# Patient Record
Sex: Female | Born: 1995
Health system: Southern US, Community
[De-identification: ages and names within clinical notes are randomized; demographics above are authoritative.]

## PROBLEM LIST (undated history)

## (undated) ENCOUNTER — Inpatient Hospital Stay (HOSPITAL_COMMUNITY): Payer: Self-pay

## (undated) DIAGNOSIS — M419 Scoliosis, unspecified: Secondary | ICD-10-CM

## (undated) DIAGNOSIS — M549 Dorsalgia, unspecified: Secondary | ICD-10-CM

## (undated) DIAGNOSIS — O99891 Other specified diseases and conditions complicating pregnancy: Secondary | ICD-10-CM

## (undated) DIAGNOSIS — R001 Bradycardia, unspecified: Secondary | ICD-10-CM

## (undated) DIAGNOSIS — O468X9 Other antepartum hemorrhage, unspecified trimester: Secondary | ICD-10-CM

## (undated) DIAGNOSIS — O039 Complete or unspecified spontaneous abortion without complication: Secondary | ICD-10-CM

## (undated) DIAGNOSIS — F329 Major depressive disorder, single episode, unspecified: Secondary | ICD-10-CM

## (undated) DIAGNOSIS — F32A Depression, unspecified: Secondary | ICD-10-CM

## (undated) DIAGNOSIS — IMO0001 Reserved for inherently not codable concepts without codable children: Secondary | ICD-10-CM

## (undated) DIAGNOSIS — O418X90 Other specified disorders of amniotic fluid and membranes, unspecified trimester, not applicable or unspecified: Secondary | ICD-10-CM

## (undated) HISTORY — PX: NO PAST SURGERIES: SHX2092

---
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
## 2016-02-24 NOTE — L&D Delivery Note (Signed)
Patient is a 21 y.o. now G1P0 s/p NSVD at 3374w3d, who was admitted for SOL (Active labor).  Delivery Note At 2:32 PM a viable female was delivered via  (Presentation:cephalic ;LOA  ).  APGAR:7 , 9; weight 2971g Placenta status:intact Cord: 3 vessel  with the following complications none    Anesthesia:  epidural Episiotomy:  none Lacerations:  none Suture Repair: none Est. Blood Loss (mL):200    Mom to postpartum.  Baby to Couplet care / Skin to Skin.  Head delivered LOA. No nuchal cord present. Shoulder and body delivered in usual fashion. Infant with spontaneous cry, placed on mother's abdomen, dried and bulb suctioned. Cord clamped x 2  cut by family member. Cord blood drawn. Placenta delivered spontaneously with gentle cord traction. Fundus firm with massage and Pitocin. Perineum inspected and found to have no laceration.   Teodoro KilKerrriann S. Lacey Wallman,  MD Family Medicine Resident PGY-1 12/25/16, 2:42 PM

## 2016-06-18 DIAGNOSIS — Z3401 Encounter for supervision of normal first pregnancy, first trimester: Secondary | ICD-10-CM | POA: Diagnosis not present

## 2016-06-18 LAB — OB RESULTS CONSOLE GC/CHLAMYDIA
CHLAMYDIA, DNA PROBE: NEGATIVE
Gonorrhea: NEGATIVE

## 2016-06-18 LAB — OB RESULTS CONSOLE HEPATITIS B SURFACE ANTIGEN: HEP B S AG: NEGATIVE

## 2016-06-18 LAB — OB RESULTS CONSOLE RPR: RPR: NONREACTIVE

## 2016-06-18 LAB — OB RESULTS CONSOLE ABO/RH: RH TYPE: POSITIVE

## 2016-06-18 LAB — OB RESULTS CONSOLE RUBELLA ANTIBODY, IGM: Rubella: IMMUNE

## 2016-06-18 LAB — OB RESULTS CONSOLE HIV ANTIBODY (ROUTINE TESTING): HIV: NONREACTIVE

## 2016-06-18 LAB — OB RESULTS CONSOLE ANTIBODY SCREEN: Antibody Screen: NEGATIVE

## 2016-07-16 DIAGNOSIS — Z3402 Encounter for supervision of normal first pregnancy, second trimester: Secondary | ICD-10-CM | POA: Diagnosis not present

## 2016-07-16 DIAGNOSIS — N760 Acute vaginitis: Secondary | ICD-10-CM | POA: Diagnosis not present

## 2016-07-16 DIAGNOSIS — Z3A17 17 weeks gestation of pregnancy: Secondary | ICD-10-CM | POA: Diagnosis not present

## 2016-07-16 DIAGNOSIS — O281 Abnormal biochemical finding on antenatal screening of mother: Secondary | ICD-10-CM | POA: Diagnosis not present

## 2016-07-24 ENCOUNTER — Encounter (HOSPITAL_COMMUNITY): Payer: Self-pay | Admitting: *Deleted

## 2016-07-24 ENCOUNTER — Inpatient Hospital Stay (HOSPITAL_COMMUNITY)
Admission: AD | Admit: 2016-07-24 | Discharge: 2016-07-24 | Disposition: A | Discharge status: Home / Self Care | Payer: Medicaid Other | Source: Ambulatory Visit | Attending: Obstetrics and Gynecology | Admitting: Obstetrics and Gynecology

## 2016-07-24 DIAGNOSIS — Z809 Family history of malignant neoplasm, unspecified: Secondary | ICD-10-CM | POA: Diagnosis not present

## 2016-07-24 DIAGNOSIS — Z3A18 18 weeks gestation of pregnancy: Secondary | ICD-10-CM | POA: Insufficient documentation

## 2016-07-24 DIAGNOSIS — N898 Other specified noninflammatory disorders of vagina: Secondary | ICD-10-CM | POA: Insufficient documentation

## 2016-07-24 DIAGNOSIS — R109 Unspecified abdominal pain: Secondary | ICD-10-CM | POA: Insufficient documentation

## 2016-07-24 DIAGNOSIS — O26892 Other specified pregnancy related conditions, second trimester: Secondary | ICD-10-CM | POA: Insufficient documentation

## 2016-07-24 DIAGNOSIS — Z8249 Family history of ischemic heart disease and other diseases of the circulatory system: Secondary | ICD-10-CM | POA: Diagnosis not present

## 2016-07-24 DIAGNOSIS — R197 Diarrhea, unspecified: Secondary | ICD-10-CM | POA: Diagnosis not present

## 2016-07-24 LAB — WET PREP, GENITAL
Clue Cells Wet Prep HPF POC: NONE SEEN
Sperm: NONE SEEN
TRICH WET PREP: NONE SEEN
Yeast Wet Prep HPF POC: NONE SEEN

## 2016-07-24 LAB — URINALYSIS, ROUTINE W REFLEX MICROSCOPIC
Bilirubin Urine: NEGATIVE
GLUCOSE, UA: NEGATIVE mg/dL
Hgb urine dipstick: NEGATIVE
KETONES UR: NEGATIVE mg/dL
NITRITE: NEGATIVE
PH: 6 (ref 5.0–8.0)
Protein, ur: NEGATIVE mg/dL
Specific Gravity, Urine: 1.021 (ref 1.005–1.030)

## 2016-07-24 LAB — CBC WITH DIFFERENTIAL/PLATELET
BASOS PCT: 0 %
Basophils Absolute: 0 10*3/uL (ref 0.0–0.1)
Eosinophils Absolute: 0.1 10*3/uL (ref 0.0–0.7)
Eosinophils Relative: 1 %
HEMATOCRIT: 32.7 % — AB (ref 36.0–46.0)
Hemoglobin: 11.4 g/dL — ABNORMAL LOW (ref 12.0–15.0)
LYMPHS ABS: 2.1 10*3/uL (ref 0.7–4.0)
Lymphocytes Relative: 38 %
MCH: 30.4 pg (ref 26.0–34.0)
MCHC: 34.9 g/dL (ref 30.0–36.0)
MCV: 87.2 fL (ref 78.0–100.0)
MONO ABS: 0.3 10*3/uL (ref 0.1–1.0)
MONOS PCT: 5 %
NEUTROS ABS: 3.1 10*3/uL (ref 1.7–7.7)
Neutrophils Relative %: 56 %
Platelets: 229 10*3/uL (ref 150–400)
RBC: 3.75 MIL/uL — ABNORMAL LOW (ref 3.87–5.11)
RDW: 13.8 % (ref 11.5–15.5)
WBC: 5.6 10*3/uL (ref 4.0–10.5)

## 2016-07-24 NOTE — Discharge Instructions (Signed)
Abdominal Pain During Pregnancy °Abdominal pain is common in pregnancy. Most of the time, it does not cause harm. There are many causes of abdominal pain. Some causes are more serious than others and sometimes the cause is not known. Abdominal pain can be a sign that something is very wrong with the pregnancy or the pain may have nothing to do with the pregnancy. Always tell your health care provider if you have any abdominal pain. °Follow these instructions at home: °· Do not have sex or put anything in your vagina until your symptoms go away completely. °· Watch your abdominal pain for any changes. °· Get plenty of rest until your pain improves. °· Drink enough fluid to keep your urine clear or pale yellow. °· Take over-the-counter or prescription medicines only as told by your health care provider. °· Keep all follow-up visits as told by your health care provider. This is important. °Contact a health care provider if: °· You have a fever. °· Your pain gets worse or you have cramping. °· Your pain continues after resting. °Get help right away if: °· You are bleeding, leaking fluid, or passing tissue from the vagina. °· You have vomiting or diarrhea that does not go away. °· You have painful or bloody urination. °· You notice a decrease in your baby's movements. °· You feel very weak or faint. °· You have shortness of breath. °· You develop a severe headache with abdominal pain. °· You have abnormal vaginal discharge with abdominal pain. °This information is not intended to replace advice given to you by your health care provider. Make sure you discuss any questions you have with your health care provider. °Document Released: 02/09/2005 Document Revised: 11/21/2015 Document Reviewed: 09/08/2012 °Elsevier Interactive Patient Education © 2018 Elsevier Inc. ° ° °Round Ligament Pain During Pregnancy  ° °Round ligament pain is a sharp pain or jabbing feeling often felt in the lower belly or groin area on one or both  sides. It is one of the most common complaints during pregnancy and is considered a normal part of pregnancy. It is most often felt during the second trimester.  ° °Here is what you need to know about round ligament pain, including some tips to help you feel better.  ° °Causes of Round Ligament Pain  ° °Several thick ligaments surround and support your womb (uterus) as it grows during pregnancy. One of them is called the round ligament.  ° °The round ligament connects the front part of the womb to your groin, the area where your legs attach to your pelvis. The round ligament normally tightens and relaxes slowly.  ° °As your baby and womb grow, the round ligament stretches. That makes it more likely to become strained.  ° °Sudden movements can cause the ligament to tighten quickly, like a rubber band snapping. This causes a sudden and quick jabbing feeling.  ° °Symptoms of Round Ligament Pain  ° °Round ligament pain can be concerning and uncomfortable. But it is considered normal as your body changes during pregnancy.  ° °The symptoms of round ligament pain include a sharp, sudden spasm in the belly. It usually affects the right side, but it may happen on both sides. The pain only lasts a few seconds.  ° °Exercise may cause the pain, as will rapid movements such as:  °sneezing  °coughing  °laughing  °rolling over in bed  °standing up too quickly  ° °Treatment of Round Ligament Pain  ° °Here are some tips that   help reduce your discomfort:   Pain relief. Take over-the-counter acetaminophen for pain, if necessary. Ask your doctor if this is OK.   Exercise. Get plenty of exercise to keep your stomach (core) muscles strong. Doing stretching exercises or prenatal yoga can be helpful. Ask your doctor which exercises are safe for you and your baby.   A helpful exercise involves putting your hands and knees on the floor, lowering your head, and pushing your backside into the air.   Avoid sudden movements. Change  positions slowly (such as standing up or sitting down) to avoid sudden movements that may cause stretching and pain.   Flex your hips. Bend and flex your hips before you cough, sneeze, or laugh to avoid pulling on the ligaments.   Apply warmth. A heating pad or warm bath may be helpful. Ask your doctor if this is OK. Extreme heat can be dangerous to the baby.   You should try to modify your daily activity level and avoid positions that may worsen the condition.   When to Call the Doctor/Midwife   Always tell your doctor or midwife about any type of pain you have during pregnancy. Round ligament pain is quick and doesn't last long.   Call your health care provider immediately if you have:  severe pain  fever  chills  pain on urination  difficulty walking   Belly pain during pregnancy can be due to many different causes. It is important for your doctor to rule out more serious conditions, including pregnancy complications such as placenta abruption or non-pregnancy illnesses such as:  inguinal hernia  appendicitis  stomach, liver, and kidney problems  Preterm labor pains may sometimes be mistaken for round ligament pain.     Second Trimester of Pregnancy The second trimester is from week 14 through week 27 (months 4 through 6). The second trimester is often a time when you feel your best. Your body has adjusted to being pregnant, and you begin to feel better physically. Usually, morning sickness has lessened or quit completely, you may have more energy, and you may have an increase in appetite. The second trimester is also a time when the fetus is growing rapidly. At the end of the sixth month, the fetus is about 9 inches long and weighs about 1 pounds. You will likely begin to feel the baby move (quickening) between 16 and 20 weeks of pregnancy. Body changes during your second trimester Your body continues to go through many changes during your second trimester. The changes vary from  woman to woman.  Your weight will continue to increase. You will notice your lower abdomen bulging out.  You may begin to get stretch marks on your hips, abdomen, and breasts.  You may develop headaches that can be relieved by medicines. The medicines should be approved by your health care provider.  You may urinate more often because the fetus is pressing on your bladder.  You may develop or continue to have heartburn as a result of your pregnancy.  You may develop constipation because certain hormones are causing the muscles that push waste through your intestines to slow down.  You may develop hemorrhoids or swollen, bulging veins (varicose veins).  You may have back pain. This is caused by: ? Weight gain. ? Pregnancy hormones that are relaxing the joints in your pelvis. ? A shift in weight and the muscles that support your balance.  Your breasts will continue to grow and they will continue to become tender.  Your gums may bleed and may be sensitive to brushing and flossing.  Dark spots or blotches (chloasma, mask of pregnancy) may develop on your face. This will likely fade after the baby is born.  A dark line from your belly button to the pubic area (linea nigra) may appear. This will likely fade after the baby is born.  You may have changes in your hair. These can include thickening of your hair, rapid growth, and changes in texture. Some women also have hair loss during or after pregnancy, or hair that feels dry or thin. Your hair will most likely return to normal after your baby is born.  What to expect at prenatal visits During a routine prenatal visit:  You will be weighed to make sure you and the fetus are growing normally.  Your blood pressure will be taken.  Your abdomen will be measured to track your baby's growth.  The fetal heartbeat will be listened to.  Any test results from the previous visit will be discussed.  Your health care provider may ask  you:  How you are feeling.  If you are feeling the baby move.  If you have had any abnormal symptoms, such as leaking fluid, bleeding, severe headaches, or abdominal cramping.  If you are using any tobacco products, including cigarettes, chewing tobacco, and electronic cigarettes.  If you have any questions.  Other tests that may be performed during your second trimester include:  Blood tests that check for: ? Low iron levels (anemia). ? High blood sugar that affects pregnant women (gestational diabetes) between 17 and 28 weeks. ? Rh antibodies. This is to check for a protein on red blood cells (Rh factor).  Urine tests to check for infections, diabetes, or protein in the urine.  An ultrasound to confirm the proper growth and development of the baby.  An amniocentesis to check for possible genetic problems.  Fetal screens for spina bifida and Down syndrome.  HIV (human immunodeficiency virus) testing. Routine prenatal testing includes screening for HIV, unless you choose not to have this test.  Follow these instructions at home: Medicines  Follow your health care provider's instructions regarding medicine use. Specific medicines may be either safe or unsafe to take during pregnancy.  Take a prenatal vitamin that contains at least 600 micrograms (mcg) of folic acid.  If you develop constipation, try taking a stool softener if your health care provider approves. Eating and drinking  Eat a balanced diet that includes fresh fruits and vegetables, whole grains, good sources of protein such as meat, eggs, or tofu, and low-fat dairy. Your health care provider will help you determine the amount of weight gain that is right for you.  Avoid raw meat and uncooked cheese. These carry germs that can cause birth defects in the baby.  If you have low calcium intake from food, talk to your health care provider about whether you should take a daily calcium supplement.  Limit foods that  are high in fat and processed sugars, such as fried and sweet foods.  To prevent constipation: ? Drink enough fluid to keep your urine clear or pale yellow. ? Eat foods that are high in fiber, such as fresh fruits and vegetables, whole grains, and beans. Activity  Exercise only as directed by your health care provider. Most women can continue their usual exercise routine during pregnancy. Try to exercise for 30 minutes at least 5 days a week. Stop exercising if you experience uterine contractions.  Avoid heavy lifting,  wear low heel shoes, and practice good posture.  A sexual relationship may be continued unless your health care provider directs you otherwise. Relieving pain and discomfort  Wear a good support bra to prevent discomfort from breast tenderness.  Take warm sitz baths to soothe any pain or discomfort caused by hemorrhoids. Use hemorrhoid cream if your health care provider approves.  Rest with your legs elevated if you have leg cramps or low back pain.  If you develop varicose veins, wear support hose. Elevate your feet for 15 minutes, 3-4 times a day. Limit salt in your diet. Prenatal Care  Write down your questions. Take them to your prenatal visits.  Keep all your prenatal visits as told by your health care provider. This is important. Safety  Wear your seat belt at all times when driving.  Make a list of emergency phone numbers, including numbers for family, friends, the hospital, and police and fire departments. General instructions  Ask your health care provider for a referral to a local prenatal education class. Begin classes no later than the beginning of month 6 of your pregnancy.  Ask for help if you have counseling or nutritional needs during pregnancy. Your health care provider can offer advice or refer you to specialists for help with various needs.  Do not use hot tubs, steam rooms, or saunas.  Do not douche or use tampons or scented sanitary  pads.  Do not cross your legs for long periods of time.  Avoid cat litter boxes and soil used by cats. These carry germs that can cause birth defects in the baby and possibly loss of the fetus by miscarriage or stillbirth.  Avoid all smoking, herbs, alcohol, and unprescribed drugs. Chemicals in these products can affect the formation and growth of the baby.  Do not use any products that contain nicotine or tobacco, such as cigarettes and e-cigarettes. If you need help quitting, ask your health care provider.  Visit your dentist if you have not gone yet during your pregnancy. Use a soft toothbrush to brush your teeth and be gentle when you floss. Contact a health care provider if:  You have dizziness.  You have mild pelvic cramps, pelvic pressure, or nagging pain in the abdominal area.  You have persistent nausea, vomiting, or diarrhea.  You have a bad smelling vaginal discharge.  You have pain when you urinate. Get help right away if:  You have a fever.  You are leaking fluid from your vagina.  You have spotting or bleeding from your vagina.  You have severe abdominal cramping or pain.  You have rapid weight gain or weight loss.  You have shortness of breath with chest pain.  You notice sudden or extreme swelling of your face, hands, ankles, feet, or legs.  You have not felt your baby move in over an hour.  You have severe headaches that do not go away when you take medicine.  You have vision changes. Summary  The second trimester is from week 14 through week 27 (months 4 through 6). It is also a time when the fetus is growing rapidly.  Your body goes through many changes during pregnancy. The changes vary from woman to woman.  Avoid all smoking, herbs, alcohol, and unprescribed drugs. These chemicals affect the formation and growth your baby.  Do not use any tobacco products, such as cigarettes, chewing tobacco, and e-cigarettes. If you need help quitting, ask your  health care provider.  Contact your health care provider if  you have any questions. Keep all prenatal visits as told by your health care provider. This is important. This information is not intended to replace advice given to you by your health care provider. Make sure you discuss any questions you have with your health care provider. Document Released: 02/03/2001 Document Revised: 07/18/2015 Document Reviewed: 04/12/2012 Elsevier Interactive Patient Education  2017 [employer]

## 2016-07-24 NOTE — MAU Provider Note (Signed)
Chief Complaint: Abdominal Cramping   First Provider Initiated Contact with Patient 07/24/16 2100     SUBJECTIVE HPI: Emily Cantu is a 21 y.o. G1P0 at [redacted]w[redacted]d who presents to Maternity Admissions reporting right lower quadrant pain 2 weeks. Started prenatal care at [facility] last week. Treated for BV. Anatomy ultrasound scheduled for late June per patient. She is requesting an ultrasound in MAU.  Location: Right lower quadrant Quality: Sharp Severity: 7-8/10 on pain scale Duration: 2 weeks Context: [redacted] weeks pregnant Timing: Constant Modifying factors: Improves with walking. Hasn't tried anything else for the pain. No relationship to voiding or defecation. Associated signs and symptoms: Positive for alternating diarrhea and constipation. Mild nausea throughout the pregnancy. Negative for fever, chills, vomiting, dysuria, hematuria, urgency, frequency, vaginal bleeding. Still has some white vaginal discharge but no longer has odor.  Past Medical History:  Diagnosis Date  . Medical history non-contributory    OB History  Gravida Para Term Preterm AB Living  1            SAB TAB Ectopic Multiple Live Births               # Outcome Date GA Lbr Len/2nd Weight Sex Delivery Anes PTL Lv  1 Current              Past Surgical History:  Procedure Laterality Date  . NO PAST SURGERIES     Social History   Social History  . Marital status: Single    Spouse name: N/A  . Number of children: N/A  . Years of education: N/A   Occupational History  . Not on file.   Social History Main Topics  . Smoking status: Never Smoker  . Smokeless tobacco: Never Used  . Alcohol use No  . Drug use: No  . Sexual activity: Yes   Other Topics Concern  . Not on file   Social History Narrative  . No narrative on file   Family History  Problem Relation Age of Onset  . Hypertension Mother   . Cancer Father   . Hypertension Maternal Grandmother    No current  facility-administered medications on file prior to encounter.    No current outpatient prescriptions on file prior to encounter.   No Known Allergies  I have reviewed patient's Past Medical Hx, Surgical Hx, Family Hx, Social Hx, medications and allergies.   Review of Systems  Constitutional: Negative for appetite change, chills and fever.  Gastrointestinal: Positive for abdominal pain, constipation, diarrhea and nausea. Negative for abdominal distention, blood in stool and vomiting.  Genitourinary: Positive for vaginal discharge. Negative for difficulty urinating, dyspareunia, dysuria, flank pain, frequency, hematuria, pelvic pain, urgency, vaginal bleeding and vaginal pain.  Musculoskeletal: Negative for back pain.    OBJECTIVE Patient Vitals for the past 24 hrs:  BP Temp Pulse Resp Height Weight  07/24/16 2147 116/63 97.4 F (36.3 C) 62 18 - -  07/24/16 1940 (!) 114/50 98.3 F (36.8 C) 65 18 5\' 3"  (1.6 m) 126 lb (57.2 kg)   Constitutional: Well-developed, well-nourished female in no acute distress.  Cardiovascular: normal rate Respiratory: normal rate and effort.  GI: Abd soft, Mild, Non-focal right lower quadrant tenderness to palpation. Negative mass or rebound tenderness. gravid, appropriate for gestational age. Pos BS x 4 MS: Extremities nontender, no edema, normal ROM Neurologic: Alert and oriented x 4.  GU: Neg CVAT.  SPECULUM EXAM: NEFG, physiologic discharge, no blood noted, cervix clean, normal ectropion.  BIMANUAL: cervix  long/closed; uterus 18-week size, no adnexal tenderness or masses. No CMT.  Fetal heart rate 142 by Doppler.  LAB RESULTS Results for orders placed or performed during the hospital encounter of 07/24/16 (from the past 24 hour(s))  Urinalysis, Routine w reflex microscopic     Status: Abnormal   Collection Time: 07/24/16  7:47 PM  Result Value Ref Range   Color, Urine YELLOW YELLOW   APPearance HAZY (A) CLEAR   Specific Gravity, Urine 1.021 1.005  - 1.030   pH 6.0 5.0 - 8.0   Glucose, UA NEGATIVE NEGATIVE mg/dL   Hgb urine dipstick NEGATIVE NEGATIVE   Bilirubin Urine NEGATIVE NEGATIVE   Ketones, ur NEGATIVE NEGATIVE mg/dL   Protein, ur NEGATIVE NEGATIVE mg/dL   Nitrite NEGATIVE NEGATIVE   Leukocytes, UA TRACE (A) NEGATIVE   RBC / HPF 0-5 0 - 5 RBC/hpf   WBC, UA 6-30 0 - 5 WBC/hpf   Bacteria, UA RARE (A) NONE SEEN   Squamous Epithelial / LPF 6-30 (A) NONE SEEN   Mucous PRESENT    Ca Oxalate Crys, UA PRESENT   Wet prep, genital     Status: Abnormal   Collection Time: 07/24/16  9:10 PM  Result Value Ref Range   Yeast Wet Prep HPF POC NONE SEEN NONE SEEN   Trich, Wet Prep NONE SEEN NONE SEEN   Clue Cells Wet Prep HPF POC NONE SEEN NONE SEEN   WBC, Wet Prep HPF POC FEW (A) NONE SEEN   Sperm NONE SEEN   CBC with Differential/Platelet     Status: Abnormal   Collection Time: 07/24/16  9:24 PM  Result Value Ref Range   WBC 5.6 4.0 - 10.5 K/uL   RBC 3.75 (L) 3.87 - 5.11 MIL/uL   Hemoglobin 11.4 (L) 12.0 - 15.0 g/dL   HCT 96.0 (L) 45.4 - 09.8 %   MCV 87.2 78.0 - 100.0 fL   MCH 30.4 26.0 - 34.0 pg   MCHC 34.9 30.0 - 36.0 g/dL   RDW 11.9 14.7 - 82.9 %   Platelets 229 150 - 400 K/uL   Neutrophils Relative % 56 %   Neutro Abs 3.1 1.7 - 7.7 K/uL   Lymphocytes Relative 38 %   Lymphs Abs 2.1 0.7 - 4.0 K/uL   Monocytes Relative 5 %   Monocytes Absolute 0.3 0.1 - 1.0 K/uL   Eosinophils Relative 1 %   Eosinophils Absolute 0.1 0.0 - 0.7 K/uL   Basophils Relative 0 %   Basophils Absolute 0.0 0.0 - 0.1 K/uL    IMAGING No results found.  MAU COURSE Orders Placed This Encounter  Procedures  . Wet prep, genital  . Urinalysis, Routine w reflex microscopic  . CBC with Differential/Platelet  . Discharge patient   Declines pain medication.  Meds ordered this encounter  Medications  . Prenatal Vit-Fe Fumarate-FA (PRENATAL MULTIVITAMIN) TABS tablet    Sig: Take 1 tablet by mouth daily at 12 noon.   MDM - Right lower quadrant  pain in pregnancy without evidence of preterm labor or UTI. Low suspicion for appendicitis due to location of pain, benign exam and absence of fever, leukocytosis. Suspect pain is related to alternating constipation and diarrhea and/or round ligament pain.  . ASSESSMENT 1. Abdominal pain during pregnancy in second trimester     PLAN Discharge home in stable condition. Abdominal pain and appendicitis precautions. Warm compresses, ibuprofen when necessary. Do not use ibuprofen after [redacted] weeks gestation.  Return to maternity admissions or ED for fever,  chills, worsening or localization of pain, nausea and vomiting or decreased appetite.  Follow-up Information    [facility].   Why:  as scheduled for prenatal appointment Contact information: [address] [place] [id number] [phone]        [facility] Follow up.   Why:  in pregnancy emergencies Contact information: [address] [id number] [place] [id number] [phone]         Allergies as of 07/24/2016   No Known Allergies     Medication List    TAKE these medications   prenatal multivitamin Tabs tablet Take 1 tablet by mouth daily at 12 noon.        Katrinka BlazingSmith, [place], [place] 07/24/2016  9:51 PM

## 2016-07-24 NOTE — MAU Note (Signed)
Cramping since yesterday but having some pain R lower abd for 2 wks. No bleeding but some white vag d/c without odor. Recently treated for BV

## 2016-07-24 NOTE — Progress Notes (Signed)
Emily KinsmanVirginia Cantu CNM in to discuss test results and dc plan with pt. Written and verbal d/c instructions given and understanding voiced.

## 2016-07-27 LAB — GC/CHLAMYDIA PROBE AMP (~~LOC~~) NOT AT ARMC
Chlamydia: NEGATIVE
Neisseria Gonorrhea: NEGATIVE

## 2016-08-12 DIAGNOSIS — Z3402 Encounter for supervision of normal first pregnancy, second trimester: Secondary | ICD-10-CM | POA: Diagnosis not present

## 2016-08-24 DIAGNOSIS — N760 Acute vaginitis: Secondary | ICD-10-CM | POA: Diagnosis not present

## 2016-08-24 DIAGNOSIS — Z3402 Encounter for supervision of normal first pregnancy, second trimester: Secondary | ICD-10-CM | POA: Diagnosis not present

## 2016-09-09 DIAGNOSIS — Z3402 Encounter for supervision of normal first pregnancy, second trimester: Secondary | ICD-10-CM | POA: Diagnosis not present

## 2016-10-07 DIAGNOSIS — Z3403 Encounter for supervision of normal first pregnancy, third trimester: Secondary | ICD-10-CM | POA: Diagnosis not present

## 2016-10-20 DIAGNOSIS — Z3403 Encounter for supervision of normal first pregnancy, third trimester: Secondary | ICD-10-CM | POA: Diagnosis not present

## 2016-11-04 DIAGNOSIS — Z3403 Encounter for supervision of normal first pregnancy, third trimester: Secondary | ICD-10-CM | POA: Diagnosis not present

## 2016-11-05 DIAGNOSIS — O365930 Maternal care for other known or suspected poor fetal growth, third trimester, not applicable or unspecified: Secondary | ICD-10-CM | POA: Diagnosis not present

## 2016-11-13 ENCOUNTER — Inpatient Hospital Stay (HOSPITAL_COMMUNITY)
Admission: AD | Admit: 2016-11-13 | Discharge: 2016-11-13 | Disposition: A | Discharge status: Home / Self Care | Payer: Medicaid Other | Source: Ambulatory Visit | Attending: Obstetrics & Gynecology | Admitting: Obstetrics & Gynecology

## 2016-11-13 ENCOUNTER — Encounter (HOSPITAL_COMMUNITY): Payer: Self-pay | Admitting: *Deleted

## 2016-11-13 DIAGNOSIS — O26893 Other specified pregnancy related conditions, third trimester: Secondary | ICD-10-CM

## 2016-11-13 DIAGNOSIS — R102 Pelvic and perineal pain: Secondary | ICD-10-CM | POA: Insufficient documentation

## 2016-11-13 DIAGNOSIS — Z3689 Encounter for other specified antenatal screening: Secondary | ICD-10-CM

## 2016-11-13 DIAGNOSIS — Z3A34 34 weeks gestation of pregnancy: Secondary | ICD-10-CM

## 2016-11-13 HISTORY — DX: Depression, unspecified: F32.A

## 2016-11-13 HISTORY — DX: Major depressive disorder, single episode, unspecified: F32.9

## 2016-11-13 LAB — URINALYSIS, ROUTINE W REFLEX MICROSCOPIC
BILIRUBIN URINE: NEGATIVE
Glucose, UA: NEGATIVE mg/dL
HGB URINE DIPSTICK: NEGATIVE
Ketones, ur: NEGATIVE mg/dL
Leukocytes, UA: NEGATIVE
NITRITE: NEGATIVE
PROTEIN: NEGATIVE mg/dL
SPECIFIC GRAVITY, URINE: 1.013 (ref 1.005–1.030)
pH: 7 (ref 5.0–8.0)

## 2016-11-13 MED ORDER — CYCLOBENZAPRINE HCL 10 MG PO TABS
10.0000 mg | ORAL_TABLET | Freq: Three times a day (TID) | ORAL | Status: DC | PRN
  Administered 2016-11-13: 10 mg via ORAL
  Filled 2016-11-13: qty 1

## 2016-11-13 MED ORDER — CYCLOBENZAPRINE HCL 10 MG PO TABS: 10.0000 mg | ORAL_TABLET | Freq: Three times a day (TID) | ORAL | 0 refills | Status: DC | PRN

## 2016-11-13 MED ORDER — ACETAMINOPHEN 500 MG PO TABS
1000.0000 mg | ORAL_TABLET | Freq: Four times a day (QID) | ORAL | Status: DC | PRN
  Administered 2016-11-13: 1000 mg via ORAL
  Filled 2016-11-13: qty 2

## 2016-11-13 NOTE — Progress Notes (Signed)
Pt requesting to leave, states pain remains 10 on 0-10 scale, but wants to leave anyway.

## 2016-11-13 NOTE — Discharge Instructions (Signed)
Round Ligament Pain °The round ligament is a cord of muscle and tissue that helps to support the uterus. It can become a source of pain during pregnancy if it becomes stretched or twisted as the baby grows. The pain usually begins in the second trimester of pregnancy, and it can come and go until the baby is delivered. It is not a serious problem, and it does not cause harm to the baby. °Round ligament pain is usually a short, sharp, and pinching pain, but it can also be a dull, lingering, and aching pain. The pain is felt in the lower side of the abdomen or in the groin. It usually starts deep in the groin and moves up to the outside of the hip area. Pain can occur with: °· A sudden change in position. °· Rolling over in bed. °· Coughing or sneezing. °· Physical activity. ° °Follow these instructions at home: °Watch your condition for any changes. Take these steps to help with your pain: °· When the pain starts, relax. Then try: °? Sitting down. °? Flexing your knees up to your abdomen. °? Lying on your side with one pillow under your abdomen and another pillow between your legs. °? Sitting in a warm bath for 15-20 minutes or until the pain goes away. °· Take over-the-counter and prescription medicines only as told by your health care provider. °· Move slowly when you sit and stand. °· Avoid long walks if they cause pain. °· Stop or lessen your physical activities if they cause pain. ° °Contact a health care provider if: °· Your pain does not go away with treatment. °· You feel pain in your back that you did not have before. °· Your medicine is not helping. °Get help right away if: °· You develop a fever or chills. °· You develop uterine contractions. °· You develop vaginal bleeding. °· You develop nausea or vomiting. °· You develop diarrhea. °· You have pain when you urinate. °This information is not intended to replace advice given to you by your health care provider. Make sure you discuss any questions you have  with your health care provider. °Document Released: 11/19/2007 Document Revised: 07/18/2015 Document Reviewed: 04/18/2014 °Elsevier Interactive Patient Education © 2018 [employer] °Back Pain in Pregnancy °Back pain during pregnancy is common. Back pain may be caused by several factors that are related to changes during your pregnancy. °Follow these instructions at home: °Managing pain, stiffness, and swelling °· If directed, apply ice for sudden (acute) back pain. °? Put ice in a plastic bag. °? Place a towel between your skin and the bag. °? Leave the ice on for 20 minutes, 2-3 times per day. °· If directed, apply heat to the affected area before you exercise: °? Place a towel between your skin and the heat pack or heating pad. °? Leave the heat on for 20-30 minutes. °? Remove the heat if your skin turns bright red. This is especially important if you are unable to feel pain, heat, or cold. You may have a greater risk of getting burned. °Activity °· Exercise as told by your health care provider. Exercising is the best way to prevent or manage back pain. °· Listen to your body when lifting. If lifting hurts, ask for help or bend your knees. This uses your leg muscles instead of your back muscles. °· Squat down when picking up something from the floor. Do not bend over. °· Only use bed rest as told by your health care provider. Bed   rest should only be used for the most severe episodes of back pain. °Standing, Sitting, and Lying Down °· Do not stand in one place for long periods of time. °· Use good posture when sitting. Make sure your head rests over your shoulders and is not hanging forward. Use a pillow on your lower back if necessary. °· Try sleeping on your side, preferably the left side, with a pillow or two between your legs. If you are sore after a night's rest, your bed may be too soft. A firm mattress may provide more support for your back during pregnancy. °General instructions °· Do not wear high  heels. °· Eat a healthy diet. Try to gain weight within your health care provider's recommendations. °· Use a maternity girdle, elastic sling, or back brace as told by your health care provider. °· Take over-the-counter and prescription medicines only as told by your health care provider. °· Keep all follow-up visits as told by your health care provider. This is important. This includes any visits with any specialists, such as a physical therapist. °Contact a health care provider if: °· Your back pain interferes with your daily activities. °· You have increasing pain in other parts of your body. °Get help right away if: °· You develop numbness, tingling, weakness, or problems with the use of your arms or legs. °· You develop severe back pain that is not controlled with medicine. °· You have a sudden change in bowel or bladder control. °· You develop shortness of breath, dizziness, or you faint. °· You develop nausea, vomiting, or sweating. °· You have back pain that is a rhythmic, cramping pain similar to labor pains. Labor pain is usually 1-2 minutes apart, lasts for about 1 minute, and involves a bearing down feeling or pressure in your pelvis. °· You have back pain and your water breaks or you have vaginal bleeding. °· You have back pain or numbness that travels down your leg. °· Your back pain developed after you fell. °· You develop pain on one side of your back. °· You see blood in your urine. °· You develop skin blisters in the area of your back pain. °This information is not intended to replace advice given to you by your health care provider. Make sure you discuss any questions you have with your health care provider. °Document Released: 05/20/2005 Document Revised: 07/18/2015 Document Reviewed: 10/24/2014 °Elsevier Interactive Patient Education © 2018 Elsevier Inc. ° °

## 2016-11-13 NOTE — MAU Note (Signed)
Pt presents with c/o pelvic pain that began last night.  Pt reports she was running last pm and pain began afterwards.  Pt denies LOF or VB.  Reports +FM.

## 2016-11-13 NOTE — MAU Provider Note (Signed)
History     CSN: [id number]  Arrival date and time: 11/13/16 4098   First Provider Initiated Contact with Patient 11/13/16 [phone]      Chief Complaint  Patient presents with  . Pelvic Pain   G1 .3 weeks here with pelvic discomfort. Sx started last night after she went running down a hill. Pain worse with walking and position changes. Has not tried anything for the pain. No falls. No VB,LOF, or ctx. Good FM. Pregnancy has been uncomplicated, care at [facility].     OB History    Gravida Para Term Preterm AB Living   1             SAB TAB Ectopic Multiple Live Births                  Past Medical History:  Diagnosis Date  . Depression   . Medical history non-contributory     Past Surgical History:  Procedure Laterality Date  . NO PAST SURGERIES      Family History  Problem Relation Age of Onset  . Hypertension Mother   . Cancer Father   . Hypertension Maternal Grandmother     Social History  Substance Use Topics  . Smoking status: Never Smoker  . Smokeless tobacco: Never Used  . Alcohol use No    Allergies: No Known Allergies  Prescriptions Prior to Admission  Medication Sig Dispense Refill Last Dose  . Prenatal Vit-Fe Fumarate-FA (PRENATAL MULTIVITAMIN) TABS tablet Take 1 tablet by mouth daily at 12 noon.   07/24/2016 at Unknown time    Review of Systems  Gastrointestinal: Negative for abdominal pain.  Genitourinary: Positive for pelvic pain. Negative for vaginal bleeding and vaginal discharge.  Musculoskeletal: Positive for back pain.   Physical Exam   Blood pressure 104/62, pulse 79, temperature 98.4 F (36.9 C), temperature source Oral, resp. rate 18, last menstrual period 03/17/2016, SpO2 100 %.  Physical Exam  Constitutional: She is oriented to person, place, and time. She appears well-developed and well-nourished. No distress.  HENT:  Head: Normocephalic.  Neck: Normal range of motion.  Cardiovascular: Normal rate.   Respiratory: Effort normal.  No respiratory distress.  GI: Soft. She exhibits no distension. There is no tenderness.  gravid  Genitourinary:  Genitourinary Comments: Cervix closed/50%  Musculoskeletal: Normal range of motion.  Neurological: She is alert and oriented to person, place, and time.  Skin: Skin is warm and dry.  Psychiatric: She has a normal mood and affect.  EFM: 125 bpm, mod variability, + accels, no decels Toco: ctx x1  Results for orders placed or performed during the hospital encounter of 11/13/16 (from the past 24 hour(s))  Urinalysis, Routine w reflex microscopic     Status: None   Collection Time: 11/13/16  9:15 AM  Result Value Ref Range   Color, Urine YELLOW YELLOW   APPearance CLEAR CLEAR   Specific Gravity, Urine 1.013 1.005 - 1.030   pH 7.0 5.0 - 8.0   Glucose, UA NEGATIVE NEGATIVE mg/dL   Hgb urine dipstick NEGATIVE NEGATIVE   Bilirubin Urine NEGATIVE NEGATIVE   Ketones, ur NEGATIVE NEGATIVE mg/dL   Protein, ur NEGATIVE NEGATIVE mg/dL   Nitrite NEGATIVE NEGATIVE   Leukocytes, UA NEGATIVE NEGATIVE   MAU Course  Procedures Tylenol Flexeril Heating pad  MDM Labs ordered and reviewed. No evidence of UTI or PTL. Pain likely MSK and ligament strain. Pt still rates pain 10/10 after meds but is requesting discharge home. Comfort measures discussed.  Assessment and Plan   1. [redacted] weeks gestation of pregnancy   2. NST (non-stress test) reactive   3. Pelvic pain affecting pregnancy in third trimester, antepartum    Discharge home Follow up next week at [facility] as scheduled Tylenol prn Rx Flexeril Heat- warm bath or heating pad  Allergies as of 11/13/2016   No Known Allergies     Medication List    TAKE these medications   cyclobenzaprine 10 MG tablet Commonly known as:  FLEXERIL Take 1 tablet (10 mg total) by mouth 3 (three) times daily as needed for muscle spasms.   prenatal multivitamin Tabs tablet Take 1 tablet by mouth daily at 12 noon.            Discharge Care  Instructions        Start     Ordered   11/13/16 0000  cyclobenzaprine (FLEXERIL) 10 MG tablet  3 times daily PRN    Question:  Supervising Provider  Answer:  Adam Phenix   11/13/16 1020   11/13/16 0000  Discharge patient    Question Answer Comment  Discharge disposition 01-Home or Self Care   Discharge patient date 11/13/2016      11/13/16 [address] [place] 11/13/2016, 10:23 AM

## 2016-11-18 DIAGNOSIS — Z3403 Encounter for supervision of normal first pregnancy, third trimester: Secondary | ICD-10-CM | POA: Diagnosis not present

## 2016-11-18 DIAGNOSIS — O99019 Anemia complicating pregnancy, unspecified trimester: Secondary | ICD-10-CM | POA: Diagnosis not present

## 2016-11-20 DIAGNOSIS — O99019 Anemia complicating pregnancy, unspecified trimester: Secondary | ICD-10-CM | POA: Diagnosis not present

## 2016-11-20 DIAGNOSIS — Z3403 Encounter for supervision of normal first pregnancy, third trimester: Secondary | ICD-10-CM | POA: Diagnosis not present

## 2016-11-20 LAB — OB RESULTS CONSOLE GC/CHLAMYDIA
Chlamydia: NEGATIVE
Gonorrhea: NEGATIVE

## 2016-11-20 LAB — OB RESULTS CONSOLE GBS: GBS: POSITIVE

## 2016-11-27 DIAGNOSIS — O99820 Streptococcus B carrier state complicating pregnancy: Secondary | ICD-10-CM | POA: Diagnosis not present

## 2016-11-27 DIAGNOSIS — Z3403 Encounter for supervision of normal first pregnancy, third trimester: Secondary | ICD-10-CM | POA: Diagnosis not present

## 2016-11-27 DIAGNOSIS — O99019 Anemia complicating pregnancy, unspecified trimester: Secondary | ICD-10-CM | POA: Diagnosis not present

## 2016-12-02 DIAGNOSIS — Z3403 Encounter for supervision of normal first pregnancy, third trimester: Secondary | ICD-10-CM | POA: Diagnosis not present

## 2016-12-02 DIAGNOSIS — O99019 Anemia complicating pregnancy, unspecified trimester: Secondary | ICD-10-CM | POA: Diagnosis not present

## 2016-12-02 DIAGNOSIS — O99820 Streptococcus B carrier state complicating pregnancy: Secondary | ICD-10-CM | POA: Diagnosis not present

## 2016-12-09 DIAGNOSIS — Z23 Encounter for immunization: Secondary | ICD-10-CM | POA: Diagnosis not present

## 2016-12-09 DIAGNOSIS — Z3403 Encounter for supervision of normal first pregnancy, third trimester: Secondary | ICD-10-CM | POA: Diagnosis not present

## 2016-12-16 DIAGNOSIS — O99820 Streptococcus B carrier state complicating pregnancy: Secondary | ICD-10-CM | POA: Diagnosis not present

## 2016-12-16 DIAGNOSIS — Z23 Encounter for immunization: Secondary | ICD-10-CM | POA: Diagnosis not present

## 2016-12-16 DIAGNOSIS — Z3403 Encounter for supervision of normal first pregnancy, third trimester: Secondary | ICD-10-CM | POA: Diagnosis not present

## 2016-12-16 DIAGNOSIS — O99019 Anemia complicating pregnancy, unspecified trimester: Secondary | ICD-10-CM | POA: Diagnosis not present

## 2016-12-22 ENCOUNTER — Telehealth (HOSPITAL_COMMUNITY): Payer: Self-pay | Admitting: *Deleted

## 2016-12-22 DIAGNOSIS — O480 Post-term pregnancy: Secondary | ICD-10-CM | POA: Diagnosis not present

## 2016-12-22 DIAGNOSIS — Z3403 Encounter for supervision of normal first pregnancy, third trimester: Secondary | ICD-10-CM | POA: Diagnosis not present

## 2016-12-22 NOTE — Telephone Encounter (Signed)
Preadmission screen  

## 2016-12-23 ENCOUNTER — Other Ambulatory Visit (HOSPITAL_COMMUNITY): Payer: Self-pay | Admitting: Nurse Practitioner

## 2016-12-23 ENCOUNTER — Inpatient Hospital Stay (HOSPITAL_COMMUNITY)
Admission: AD | Admit: 2016-12-23 | Discharge: 2016-12-24 | Disposition: A | Discharge status: Home / Self Care | Payer: Medicaid Other | Source: Ambulatory Visit | Attending: Family Medicine | Admitting: Family Medicine

## 2016-12-23 ENCOUNTER — Encounter (HOSPITAL_COMMUNITY): Payer: Self-pay

## 2016-12-23 DIAGNOSIS — Z3A40 40 weeks gestation of pregnancy: Secondary | ICD-10-CM

## 2016-12-23 DIAGNOSIS — O480 Post-term pregnancy: Secondary | ICD-10-CM

## 2016-12-23 DIAGNOSIS — O479 False labor, unspecified: Secondary | ICD-10-CM

## 2016-12-23 NOTE — MAU Note (Signed)
Pt reports contractions that started at 1am and have been anywhere from 3-10 mins. Pt denies LOF or vag bleeding. Reports good fetal movement. States cervix was 0.5cm 2 days ago.

## 2016-12-24 ENCOUNTER — Encounter (HOSPITAL_COMMUNITY): Payer: Self-pay

## 2016-12-24 ENCOUNTER — Inpatient Hospital Stay (HOSPITAL_COMMUNITY)
Admission: AD | Admit: 2016-12-24 | Discharge: 2016-12-27 | DRG: 998 | Disposition: A | Discharge status: Home / Self Care | Payer: Medicaid Other | Source: Ambulatory Visit | Attending: Obstetrics and Gynecology | Admitting: Obstetrics and Gynecology

## 2016-12-24 ENCOUNTER — Ambulatory Visit (HOSPITAL_COMMUNITY)
Admission: RE | Admit: 2016-12-24 | Discharge: 2016-12-24 | Disposition: A | Discharge status: Home / Self Care | Payer: Medicaid Other | Source: Ambulatory Visit | Attending: Nurse Practitioner | Admitting: Nurse Practitioner

## 2016-12-24 DIAGNOSIS — D649 Anemia, unspecified: Secondary | ICD-10-CM | POA: Diagnosis present

## 2016-12-24 DIAGNOSIS — Z3403 Encounter for supervision of normal first pregnancy, third trimester: Secondary | ICD-10-CM | POA: Diagnosis not present

## 2016-12-24 DIAGNOSIS — Z3A40 40 weeks gestation of pregnancy: Secondary | ICD-10-CM | POA: Diagnosis not present

## 2016-12-24 DIAGNOSIS — O99824 Streptococcus B carrier state complicating childbirth: Secondary | ICD-10-CM | POA: Diagnosis present

## 2016-12-24 DIAGNOSIS — O9902 Anemia complicating childbirth: Principal | ICD-10-CM | POA: Diagnosis present

## 2016-12-24 DIAGNOSIS — O480 Post-term pregnancy: Secondary | ICD-10-CM | POA: Diagnosis not present

## 2016-12-24 DIAGNOSIS — Z3483 Encounter for supervision of other normal pregnancy, third trimester: Secondary | ICD-10-CM | POA: Diagnosis present

## 2016-12-24 DIAGNOSIS — Z3A00 Weeks of gestation of pregnancy not specified: Secondary | ICD-10-CM | POA: Diagnosis not present

## 2016-12-24 LAB — CBC
HCT: 34.7 % — ABNORMAL LOW (ref 36.0–46.0)
Hemoglobin: 11.6 g/dL — ABNORMAL LOW (ref 12.0–15.0)
MCH: 28.5 pg (ref 26.0–34.0)
MCHC: 33.4 g/dL (ref 30.0–36.0)
MCV: 85.3 fL (ref 78.0–100.0)
PLATELETS: 205 10*3/uL (ref 150–400)
RBC: 4.07 MIL/uL (ref 3.87–5.11)
RDW: 13.8 % (ref 11.5–15.5)
WBC: 7.1 10*3/uL (ref 4.0–10.5)

## 2016-12-24 LAB — TYPE AND SCREEN
ABO/RH(D): O POS
ANTIBODY SCREEN: NEGATIVE

## 2016-12-24 MED ORDER — OXYCODONE-ACETAMINOPHEN 5-325 MG PO TABS: 2.0000 | ORAL_TABLET | ORAL | Status: DC | PRN

## 2016-12-24 MED ORDER — SOD CITRATE-CITRIC ACID 500-334 MG/5ML PO SOLN: 30.0000 mL | ORAL | Status: DC | PRN

## 2016-12-24 MED ORDER — DEXTROSE 5 % IV SOLN
5.0000 10*6.[IU] | Freq: Once | INTRAVENOUS | Status: AC
  Administered 2016-12-24: 5 10*6.[IU] via INTRAVENOUS
  Filled 2016-12-24: qty 5

## 2016-12-24 MED ORDER — OXYTOCIN 40 UNITS IN LACTATED RINGERS INFUSION - SIMPLE MED
2.5000 [IU]/h | INTRAVENOUS | Status: DC
  Filled 2016-12-24: qty 1000

## 2016-12-24 MED ORDER — ONDANSETRON HCL 4 MG/2ML IJ SOLN: 4.0000 mg | Freq: Four times a day (QID) | INTRAMUSCULAR | Status: DC | PRN

## 2016-12-24 MED ORDER — LACTATED RINGERS IV SOLN
INTRAVENOUS | Status: DC
  Administered 2016-12-24 – 2016-12-25 (×3): via INTRAVENOUS

## 2016-12-24 MED ORDER — LACTATED RINGERS IV SOLN: 500.0000 mL | INTRAVENOUS | Status: DC | PRN

## 2016-12-24 MED ORDER — PENICILLIN G POT IN DEXTROSE 60000 UNIT/ML IV SOLN
3.0000 10*6.[IU] | INTRAVENOUS | Status: DC
  Administered 2016-12-25 (×2): 3 10*6.[IU] via INTRAVENOUS
  Filled 2016-12-24 (×6): qty 50

## 2016-12-24 MED ORDER — OXYTOCIN BOLUS FROM INFUSION
500.0000 mL | Freq: Once | INTRAVENOUS | Status: AC
  Administered 2016-12-25: 500 mL via INTRAVENOUS

## 2016-12-24 MED ORDER — LIDOCAINE HCL (PF) 1 % IJ SOLN
30.0000 mL | INTRAMUSCULAR | Status: DC | PRN
  Filled 2016-12-24: qty 30

## 2016-12-24 MED ORDER — ACETAMINOPHEN 325 MG PO TABS: 650.0000 mg | ORAL_TABLET | ORAL | Status: DC | PRN

## 2016-12-24 MED ORDER — FENTANYL CITRATE (PF) 100 MCG/2ML IJ SOLN
50.0000 ug | INTRAMUSCULAR | Status: DC | PRN
  Administered 2016-12-25: 100 ug via INTRAVENOUS
  Filled 2016-12-24: qty 2

## 2016-12-24 MED ORDER — OXYCODONE-ACETAMINOPHEN 5-325 MG PO TABS: 1.0000 | ORAL_TABLET | ORAL | Status: DC | PRN

## 2016-12-24 NOTE — MAU Note (Signed)
I have communicated with Dr. Karen ChafeLockamy and reviewed vital signs:  Vitals:   12/23/16 2342 12/24/16 0036  BP: 127/72 120/64  Pulse: 83 74  Resp: 16 18  Temp: 98.9 F (37.2 C) 98.9 F (37.2 C)  SpO2: 100%     Vaginal exam:  Dilation: Fingertip Effacement (%): 20 Station: -3 Exam by:: Bari Mantis. Denard Tuminello RN,   Also reviewed contraction pattern and that non-stress test is reactive.  It has been documented that patient is contracting irregularly with no cervical change  not indicating active labor.  Patient denies any other complaints.  Based on this report provider has given order for discharge.  A discharge order and diagnosis entered by a provider.   Labor discharge instructions reviewed with patient.

## 2016-12-24 NOTE — H&P (Signed)
LABOR AND DELIVERY ADMISSION HISTORY AND PHYSICAL NOTE  Emily Cantu is a 21 y.o. female G1P0 with IUP at [redacted]w[redacted]d by LMP and confirmed by U/S presenting for normal labor She reports positive fetal movement. She denies leakage of fluid or vaginal bleeding. Pt reports headache (6/10 pain scale), but denies any blurry vision, dizziness, recent illnesses, epigastric pain, urinary symptoms or lower extremity swelling.   Prenatal History/Complications: [facility] Pregnancy complications:  -  None  Past Medical History: Past Medical History:  Diagnosis Date  . Depression     Past Surgical History: Past Surgical History:  Procedure Laterality Date  . NO PAST SURGERIES      Obstetrical History: OB History    Gravida Para Term Preterm AB Living   1             SAB TAB Ectopic Multiple Live Births                  Social History: Social History   Social History  . Marital status: Single    Spouse name: N/A  . Number of children: N/A  . Years of education: N/A   Social History Main Topics  . Smoking status: Never Smoker  . Smokeless tobacco: Never Used  . Alcohol use No  . Drug use: No  . Sexual activity: Yes   Other Topics Concern  . None   Social History Narrative  . None    Family History: Family History  Problem Relation Age of Onset  . Hypertension Mother   . Cancer Father   . Hypertension Maternal Grandmother     Allergies: No Known Allergies  Prescriptions Prior to Admission  Medication Sig Dispense Refill Last Dose  . cyclobenzaprine (FLEXERIL) 10 MG tablet Take 1 tablet (10 mg total) by mouth 3 (three) times daily as needed for muscle spasms. 30 tablet 0 12/24/2016 at Unknown time  . Prenatal Vit-Fe Fumarate-FA (PRENATAL MULTIVITAMIN) TABS tablet Take 1 tablet by mouth daily at 12 noon.   12/24/2016 at Unknown time     Review of Systems  All systems reviewed and negative except as stated in HPI  Physical Exam Blood  pressure 120/72, pulse 83, temperature 98.1 F (36.7 C), temperature source Oral, resp. rate 18, last menstrual period 03/17/2016, SpO2 99 %. General appearance: alert, cooperative and no distress Lungs: clear to auscultation bilaterally Heart: regular rate and rhythm Abdomen: soft, non-tender; bowel sounds normal Extremities: No calf swelling or tenderness Presentation: cephalic Fetal monitoring: 120 baseline, mod variability, + accels, no decels Uterine activity: irregular, ctx every 6-8 min Dilation: 4.5 Effacement (%): 90 Station: -2 Exam by:: Waymon Budge, RN   Prenatal labs: ABO, Rh: O/Positive/-- (04/26 0000) Antibody: Negative (04/26 0000) Rubella: Immune (04/26 0000) RPR: Nonreactive (04/26 0000)  HBsAg: Negative (04/26 0000)  HIV: Non-reactive (04/26 0000)  GC/Chlamydia: Neg (06/18/16) GBS: Positive (09/28 0000)  1 hr Glucola: None Genetic screening:  None Anatomy US: Single living intrauterine pregnancy at 40w 2d. Cephalic presentation. Normal amniotic fluid volume. BPP 8/8.  Prenatal Transfer Tool  Maternal Diabetes: No Genetic Screening: Declined   Maternal Ultrasounds/Referrals: Normal Fetal Ultrasounds or other Referrals:  None Maternal Substance Abuse:  No Significant Maternal Medications:  None Significant Maternal Lab Results: None  Results for orders placed or performed during the hospital encounter of 12/24/16 (from the past 24 hour(s))  CBC   Collection Time: 12/24/16 10:40 PM  Result Value Ref Range   WBC 7.1 4.0 - 10.5  K/uL   RBC 4.07 3.87 - 5.11 MIL/uL   Hemoglobin 11.6 (L) 12.0 - 15.0 g/dL   HCT 16.134.7 (L) 09.636.0 - 04.546.0 %   MCV 85.3 78.0 - 100.0 fL   MCH 28.5 26.0 - 34.0 pg   MCHC 33.4 30.0 - 36.0 g/dL   RDW 40.913.8 81.111.5 - 91.415.5 %   Platelets 205 150 - 400 K/uL    Patient Active Problem List   Diagnosis Date Noted  . Normal labor 12/24/2016    Assessment: Emily Cantu is a 21 y.o. G1P0 at 7513w2d here for NSVD. Pt is having irregular  contractis every 6-8. Informed to walk around to help increase frequency of ctx. Pt does not want epidural so would like to try nitric oxide and laying in the tub. Pt is hemodynamically stable. Mild anemia, but otherwise normal CBC. Will continue to monitor labor course  #Labor: progressing well. Ctx every 6-8 min #Pain: Nitric oxide #FWB: Cat 1 #ID:  GBS pos #MOF: Breast #MOC: OCP #Circ:  Yes (inpatient)  Steffanie Rainwaterrosper M Amponsah 12/24/2016, 11:25 PM   I have seen and examined this patient and agree with the management plan.

## 2016-12-24 NOTE — MAU Note (Signed)
Pt reports contractions every 5-6 mins since 7pm. Pt denies LOF or vaginal bleeding. Reports good fetal movement. Stataes she was 0.5cm yesterday

## 2016-12-24 NOTE — Discharge Instructions (Signed)

## 2016-12-25 ENCOUNTER — Encounter (HOSPITAL_COMMUNITY): Payer: Self-pay

## 2016-12-25 ENCOUNTER — Inpatient Hospital Stay (HOSPITAL_COMMUNITY): Payer: Medicaid Other | Admitting: Anesthesiology

## 2016-12-25 ENCOUNTER — Other Ambulatory Visit: Payer: Self-pay | Admitting: Advanced Practice Midwife

## 2016-12-25 DIAGNOSIS — O99824 Streptococcus B carrier state complicating childbirth: Secondary | ICD-10-CM

## 2016-12-25 DIAGNOSIS — Z3A40 40 weeks gestation of pregnancy: Secondary | ICD-10-CM

## 2016-12-25 LAB — ABO/RH: ABO/RH(D): O POS

## 2016-12-25 LAB — RPR: RPR Ser Ql: NONREACTIVE

## 2016-12-25 MED ORDER — TERBUTALINE SULFATE 1 MG/ML IJ SOLN
0.2500 mg | Freq: Once | INTRAMUSCULAR | Status: DC | PRN
  Filled 2016-12-25: qty 1

## 2016-12-25 MED ORDER — EPHEDRINE 5 MG/ML INJ
10.0000 mg | INTRAVENOUS | Status: DC | PRN
  Filled 2016-12-25: qty 2

## 2016-12-25 MED ORDER — TETANUS-DIPHTH-ACELL PERTUSSIS 5-2.5-18.5 LF-MCG/0.5 IM SUSP: 0.5000 mL | Freq: Once | INTRAMUSCULAR | Status: DC

## 2016-12-25 MED ORDER — PHENYLEPHRINE 40 MCG/ML (10ML) SYRINGE FOR IV PUSH (FOR BLOOD PRESSURE SUPPORT)
80.0000 ug | PREFILLED_SYRINGE | INTRAVENOUS | Status: DC | PRN
  Filled 2016-12-25: qty 5

## 2016-12-25 MED ORDER — COCONUT OIL OIL
1.0000 "application " | TOPICAL_OIL | Status: DC | PRN
  Filled 2016-12-25: qty 120

## 2016-12-25 MED ORDER — LIDOCAINE HCL (PF) 1 % IJ SOLN
INTRAMUSCULAR | Status: DC | PRN
  Administered 2016-12-25 (×2): 4 mL via EPIDURAL

## 2016-12-25 MED ORDER — FENTANYL 2.5 MCG/ML BUPIVACAINE 1/10 % EPIDURAL INFUSION (WH - ANES)
14.0000 mL/h | INTRAMUSCULAR | Status: DC | PRN
  Administered 2016-12-25 (×2): 14 mL/h via EPIDURAL
  Filled 2016-12-25: qty 100

## 2016-12-25 MED ORDER — OXYTOCIN 40 UNITS IN LACTATED RINGERS INFUSION - SIMPLE MED
1.0000 m[IU]/min | INTRAVENOUS | Status: DC
  Administered 2016-12-25: 2 m[IU]/min via INTRAVENOUS

## 2016-12-25 MED ORDER — PHENYLEPHRINE 40 MCG/ML (10ML) SYRINGE FOR IV PUSH (FOR BLOOD PRESSURE SUPPORT)
PREFILLED_SYRINGE | INTRAVENOUS | Status: AC
  Filled 2016-12-25: qty 20

## 2016-12-25 MED ORDER — SIMETHICONE 80 MG PO CHEW: 80.0000 mg | CHEWABLE_TABLET | ORAL | Status: DC | PRN

## 2016-12-25 MED ORDER — ONDANSETRON HCL 4 MG PO TABS
4.0000 mg | ORAL_TABLET | ORAL | Status: DC | PRN
Start: 2016-12-25 — End: 2016-12-27

## 2016-12-25 MED ORDER — BENZOCAINE-MENTHOL 20-0.5 % EX AERO
1.0000 "application " | INHALATION_SPRAY | CUTANEOUS | Status: DC | PRN
  Filled 2016-12-25: qty 56

## 2016-12-25 MED ORDER — LACTATED RINGERS IV SOLN
500.0000 mL | Freq: Once | INTRAVENOUS | Status: AC
  Administered 2016-12-25: 500 mL via INTRAVENOUS

## 2016-12-25 MED ORDER — DIPHENHYDRAMINE HCL 25 MG PO CAPS: 25.0000 mg | ORAL_CAPSULE | Freq: Four times a day (QID) | ORAL | Status: DC | PRN

## 2016-12-25 MED ORDER — FENTANYL 2.5 MCG/ML BUPIVACAINE 1/10 % EPIDURAL INFUSION (WH - ANES)
INTRAMUSCULAR | Status: AC
  Filled 2016-12-25: qty 100

## 2016-12-25 MED ORDER — ONDANSETRON HCL 4 MG/2ML IJ SOLN: 4.0000 mg | INTRAMUSCULAR | Status: DC | PRN

## 2016-12-25 MED ORDER — ACETAMINOPHEN 325 MG PO TABS
650.0000 mg | ORAL_TABLET | ORAL | Status: DC | PRN
  Administered 2016-12-26: 650 mg via ORAL
  Filled 2016-12-25: qty 2

## 2016-12-25 MED ORDER — ZOLPIDEM TARTRATE 5 MG PO TABS: 5.0000 mg | ORAL_TABLET | Freq: Every evening | ORAL | Status: DC | PRN

## 2016-12-25 MED ORDER — SENNOSIDES-DOCUSATE SODIUM 8.6-50 MG PO TABS
2.0000 | ORAL_TABLET | ORAL | Status: DC
  Administered 2016-12-26: 2 via ORAL
  Filled 2016-12-25 (×2): qty 2

## 2016-12-25 MED ORDER — IBUPROFEN 600 MG PO TABS
600.0000 mg | ORAL_TABLET | Freq: Four times a day (QID) | ORAL | Status: DC
  Administered 2016-12-25 – 2016-12-27 (×8): 600 mg via ORAL
  Filled 2016-12-25 (×8): qty 1

## 2016-12-25 MED ORDER — DIBUCAINE 1 % RE OINT
1.0000 | TOPICAL_OINTMENT | RECTAL | Status: DC | PRN
Start: 2016-12-25 — End: 2016-12-27

## 2016-12-25 MED ORDER — DIPHENHYDRAMINE HCL 50 MG/ML IJ SOLN: 12.5000 mg | INTRAMUSCULAR | Status: DC | PRN

## 2016-12-25 MED ORDER — WITCH HAZEL-GLYCERIN EX PADS: 1.0000 "application " | MEDICATED_PAD | CUTANEOUS | Status: DC | PRN

## 2016-12-25 MED ORDER — PRENATAL MULTIVITAMIN CH
1.0000 | ORAL_TABLET | Freq: Every day | ORAL | Status: DC
  Administered 2016-12-26 – 2016-12-27 (×2): 1 via ORAL
  Filled 2016-12-25 (×2): qty 1

## 2016-12-25 NOTE — Anesthesia Pain Management Evaluation Note (Signed)
  CRNA Pain Management Visit Note  Patient: Emily Cantu, 21 y.o., female  "Hello I am a member of the anesthesia team at [facility]. We have an anesthesia team available at all times to provide care throughout the hospital, including epidural management and anesthesia for C-section. I don't know your plan for the delivery whether it a natural birth, water birth, IV sedation, nitrous supplementation, doula or epidural, but we want to meet your pain goals."   1.Was your pain managed to your expectations on prior hospitalizations?   Yes   2.What is your expectation for pain management during this hospitalization?     Epidural  3.How can we help you reach that goal? Pt comfortable with epidural  Record the patient's initial score and the patient's pain goal.   Pain: 0  Pain Goal: 3 [facility] wants you to be able to say your pain was always managed very well.  Colbie Sliker 12/25/2016

## 2016-12-25 NOTE — Progress Notes (Signed)
Labor Progress Note Emily Cantu is a 21 y.o. G1P0 at 3375w3d by LMP and confirmed by U/S presenting for normal labor.  S: Pt is resting in bed comfortably.   O:  BP 111/64   Pulse 80   Temp (!) 97.5 F (36.4 C) (Oral)   Resp 18   Ht 5\' 5"  (1.651 m)   Wt 68 kg (150 lb)   LMP 03/17/2016   SpO2 99%   BMI 24.96 kg/m  EFM: 125 baseline/mod variability/ + accels, no decels Toco: ctx q7-8 min  CVE: Dilation: 8 Effacement (%): 90 Cervical Position: Middle Station: -1 Presentation: Vertex Exam by:: Emily Cantu   A&P: 21 y.o. G1P0 4175w3d presenting with SROM and SOL. IN active labor.    #Labor: Progressing well. Ctx have spaced out significantly. Will augment with pitocin.  #Pain: Epidural #FWB: Cat 1 #GBS positive - PCN  Caryl AdaJazma Desiraye Rolfson, DO 9:54 AM

## 2016-12-25 NOTE — Anesthesia Procedure Notes (Signed)
Epidural Patient location during procedure: OB Start time: 12/25/2016 4:04 AM  Staffing Anesthesiologist: Karna ChristmasELLENDER, Loomis Anacker P Performed: anesthesiologist   Preanesthetic Checklist Completed: patient identified, site marked, pre-op evaluation, timeout performed, IV checked, risks and benefits discussed and monitors and equipment checked  Epidural Patient position: sitting Prep: DuraPrep Patient monitoring: heart rate, cardiac monitor, continuous pulse ox and blood pressure Approach: midline Location: L4-L5 Injection technique: LOR air  Needle:  Needle type: Tuohy  Needle gauge: 17 G Needle length: 9 cm Needle insertion depth: 5 cm Catheter type: closed end flexible Catheter size: 19 Gauge Catheter at skin depth: 10 cm Test dose: negative and Other  Assessment Events: blood not aspirated, injection not painful, no injection resistance and negative IV test  Additional Notes Informed consent obtained prior to proceeding including risk of failure, 1% risk of PDPH, risk of minor discomfort and bruising.  Discussed rare but serious complications. Discussed alternatives to epidural analgesia and patient desires to proceed.  Timeout performed pre-procedure verifying patient name, procedure, and platelet count.  Patient tolerated procedure well. Reason for block:procedure for pain

## 2016-12-25 NOTE — Anesthesia Preprocedure Evaluation (Signed)
Anesthesia Evaluation  Patient identified by MRN, date of birth, ID band Patient awake    Reviewed: Allergy & Precautions, H&P , NPO status , Patient's Chart, lab work & pertinent test results  History of Anesthesia Complications Negative for: history of anesthetic complications  Airway Mallampati: II  TM Distance: >3 FB Neck ROM: full    Dental no notable dental hx. (+) Teeth Intact   Pulmonary neg pulmonary ROS,    Pulmonary exam normal breath sounds clear to auscultation       Cardiovascular negative cardio ROS Normal cardiovascular exam Rhythm:regular Rate:Normal     Neuro/Psych PSYCHIATRIC DISORDERS Depression negative neurological ROS     GI/Hepatic negative GI ROS, Neg liver ROS,   Endo/Other  negative endocrine ROS  Renal/GU negative Renal ROS  negative genitourinary   Musculoskeletal   Abdominal   Peds  Hematology  (+) anemia ,   Anesthesia Other Findings   Reproductive/Obstetrics (+) Pregnancy                             Anesthesia Physical Anesthesia Plan  ASA: II  Anesthesia Plan: Epidural   Post-op Pain Management:    Induction:   PONV Risk Score and Plan:   Airway Management Planned:   Additional Equipment:   Intra-op Plan:   Post-operative Plan:   Informed Consent: I have reviewed the patients History and Physical, chart, labs and discussed the procedure including the risks, benefits and alternatives for the proposed anesthesia with the patient or authorized representative who has indicated his/her understanding and acceptance.     Plan Discussed with:   Anesthesia Plan Comments:         Anesthesia Quick Evaluation

## 2016-12-25 NOTE — Lactation Note (Addendum)
This note was copied from a baby's chart. Lactation Consultation Note  Patient Name: Emily Doree BarthelLa'Ge Horst ZOXWR'UToday's Date: 12/25/2016 Reason for consult: Initial assessment;1st time breastfeeding;Primapara;Term  Visited with first time Mom, baby 3 hrs old.  Baby wrapped in blankets in GMOB arms, showing feeding cues.  Encouraged to keep baby STS, and feed often on cue.  Offered to assist with positioning and latching.  Mom quiet, LC asked if she wanted to eat her dinner first (Pizza slices on plate).  RN stated the baby was cueing, so Mom agreed.   Offered to demonstrate hand expression since she wasn't familiar.  Mom agreed, but after 2 squeezes, Mom jerked away. LC stopped hand expressing.  Explained how beneficial for Mom to perform breast massage, and hand expression was for milk supply.  Baby positioned in football hold on left side.  Education shared while hands on assisting.  Showed Mom how to wait for a wide mouth, and how to sandwich breast before bringing baby onto breast.  Demonstrated alternate breast compression, and occasional swallows identified.  After 10 mins, baby's head adjusted to facilitate chin closer to breast, and Mom pushed LC's hand away.  This was after Mom had verbally snapped at her family twice.   LC stated she would leave, and encouraged Mom to call prn for assistance.    Lactation brochure left in room.  Mom had been taught about OP lactation services available.    Maternal Data Formula Feeding for Exclusion: No Has patient been taught Hand Expression?: Yes Does the patient have breastfeeding experience prior to this delivery?: No  Feeding Feeding Type: Breast Fed Length of feed: 10 min  LATCH Score Latch: Grasps breast easily, tongue down, lips flanged, rhythmical sucking.  Audible Swallowing: A few with stimulation  Type of Nipple: Everted at rest and after stimulation  Comfort (Breast/Nipple): Soft / non-tender  Hold (Positioning): Assistance needed to  correctly position infant at breast and maintain latch.  LATCH Score: 8  Interventions Interventions: Breast feeding basics reviewed;Assisted with latch;Skin to skin;Breast massage;Hand express;Breast compression;Adjust position;Support pillows;Position options   Consult Status Consult Status: Follow-up Date: 12/26/16 Follow-up type: In-patient    Judee ClaraSmith, Evelette Hollern E 12/25/2016, 6:00 PM

## 2016-12-25 NOTE — Progress Notes (Signed)
Labor Progress Note Emily Cantu is a 21 y.o. G1P0 at 4612w3d by LMP and confirmed by U/S presenting for normal labor.  S: Pt is resting in bed in mild discomfort. States her contractions have become more intense so she would like to get an epidural.   O:  BP 120/72   Pulse 83   Temp 98.8 F (37.1 C) (Oral)   Resp 18   Ht 5\' 5"  (1.651 m)   Wt 68 kg (150 lb)   LMP 03/17/2016   SpO2 99%   BMI 24.96 kg/m  EFM: 120 baseline/mod variability/ + accels, no decels  CVE: Dilation: 6 Effacement (%): 80 Cervical Position: Middle Station: -2 Presentation: Vertex Exam by:: Minus Libertyhristy Leshowitz, RN   A&P: 21 y.o. G1P0 3112w3d by LMP and confirmed by U/S presenting for normal labor. Ctx has become more intense. IV pain meds did not help so we explained to pt that nitric oxide will not be any better. Pt requested for an epidural so nurse was informed of this.  #Labor: Progressing well. Long, irregular ctx every 4-6 min #Pain: pt has requested epidural #FWB: Cat 1 #GBS positive  Steffanie RainwaterProsper M Amponsah, Medical Student 3:39 AM  I have read the note above and performed my own evaluation and exam of the patient. I agree with the documentation above and have made necessary changes.  Jules Schickim Islay Polanco, DO PGY-1, [facility]

## 2016-12-26 ENCOUNTER — Other Ambulatory Visit: Payer: Self-pay | Admitting: Obstetrics and Gynecology

## 2016-12-26 MED ORDER — OXYCODONE-ACETAMINOPHEN 5-325 MG PO TABS
1.0000 | ORAL_TABLET | ORAL | Status: DC | PRN
  Administered 2016-12-26: 1 via ORAL
  Filled 2016-12-26: qty 1

## 2016-12-26 NOTE — Progress Notes (Signed)
POSTPARTUM PROGRESS NOTE  Post Partum Day 1  Subjective:  Emily Cantu is a 21 y.o. G1P1001 s/p SVD at 6674w3d.  No acute events overnight.  Pt denies problems with ambulating, voiding or po intake.  She denies nausea or vomiting.    Lochia Moderate.   Objective: Blood pressure (!) 109/57, pulse 72, temperature 98.4 F (36.9 C), temperature source Oral, resp. rate 18, height 5\' 5"  (1.651 m), weight 150 lb (68 kg), last menstrual period 03/17/2016, SpO2 99 %, unknown if currently breastfeeding.  Physical Exam:  General: alert, cooperative and no distress Chest: no respiratory distress Heart:regular rate, distal pulses intact Abdomen: soft, nontender,  Uterine Fundus: firm, appropriately tender DVT Evaluation: No calf swelling or tenderness Extremities: no edema  Recent Labs  12/24/16 2240  HGB 11.6*  HCT 34.7*    Assessment/Plan: Emily Cantu is a 21 y.o. G1P1001 s/p SVD at 3874w3d   PPD#1 - Doing well Contraception: POPs Feeding: breast Dispo: Plan for discharge tomorrow.   LOS: 2 days   Kandra NicolasJulie P DegeleMD 12/26/2016, 2:49 PM

## 2016-12-26 NOTE — Progress Notes (Signed)
MOB was referred for history of depression/anxiety.  Referral is screened out by Clinical Social Worker because none of the following criteria appear to apply and there are no reports impacting the pregnancy or her transition to the postpartum period.  CSW does not deem it clinically necessary to further investigate at this time.   -History of anxiety/depression during this pregnancy, or of post-partum depression.  - Diagnosis of anxiety and/or depression within last 3 years.-  - History of depression due to pregnancy loss/loss of child or -MOB's symptoms are currently being treated with medication and/or therapy.  Please contact the Clinical Social Worker if needs arise or upon MOB request.    Thena Devora, MSW, LCSW-A Clinical Social Worker  [facility]  Office: [phone]   

## 2016-12-26 NOTE — Anesthesia Postprocedure Evaluation (Signed)
Anesthesia Post Note  Patient: Emily Cantu  Procedure(s) Performed: AN AD HOC LABOR EPIDURAL     Patient location during evaluation: Mother Baby Anesthesia Type: Epidural Level of consciousness: awake and alert and oriented Pain management: satisfactory to patient Vital Signs Assessment: post-procedure vital signs reviewed and stable Respiratory status: respiratory function stable Cardiovascular status: stable Postop Assessment: no headache, no backache, epidural receding, patient able to bend at knees, no signs of nausea or vomiting and adequate PO intake Anesthetic complications: no    Last Vitals:  Vitals:   12/25/16 2139 12/26/16 0531  BP: (!) 103/53 (!) 109/57  Pulse: 68 72  Resp: 20 18  Temp: 37.1 C 36.9 C  SpO2:      Last Pain:  Vitals:   12/26/16 0630  TempSrc:   PainSc: 8    Pain Goal:                 [name]

## 2016-12-27 MED ORDER — SENNOSIDES-DOCUSATE SODIUM 8.6-50 MG PO TABS: 2.0000 | ORAL_TABLET | Freq: Every evening | ORAL | 0 refills | Status: DC | PRN

## 2016-12-27 MED ORDER — NORETHINDRONE 0.35 MG PO TABS: 1.0000 | ORAL_TABLET | Freq: Every day | ORAL | 11 refills | Status: DC

## 2016-12-27 MED ORDER — IBUPROFEN 600 MG PO TABS: 600.0000 mg | ORAL_TABLET | Freq: Four times a day (QID) | ORAL | 0 refills | Status: DC

## 2016-12-27 NOTE — Lactation Note (Signed)
This note was copied from a baby's chart. Lactation Consultation Note  Patient Name: Emily Cantu ZOXWR'UToday's Date: 12/27/2016 Reason for consult: Follow-up assessment  Baby 45 hours old. Mom reports that BF going well. Mom had questions about engorgement, so reviewed prevention and treatment methods. Mom aware of OP/BFSG and LC phone line assistance after D/C.   Maternal Data    Feeding Feeding Type: Breast Fed Length of feed: 40 min  LATCH Score                   Interventions    Lactation Tools Discussed/Used     Consult Status Consult Status: PRN    Sherlyn HayJennifer D Jenasis Straley 12/27/2016, 11:35 AM

## 2016-12-27 NOTE — Discharge Instructions (Signed)

## 2016-12-27 NOTE — Discharge Summary (Signed)
OB Discharge Summary     Patient Name: Emily Cantu DOB: 03-15-1995 MRN: [id number]  Date of admission: 12/24/2016 Delivering MD: Pincus LargePHELPS, Myrlene Riera Y   Date of discharge: 12/27/2016  Admitting diagnosis: 40wks CTX back to back Intrauterine pregnancy: 7425w3d     Secondary diagnosis:  Active Problems:   Normal labor  Additional problems: None     Discharge diagnosis: Term Pregnancy Delivered                                                                                                Post partum procedures:none  Augmentation: Pitocin  Complications: None  Hospital course:  Onset of Labor With Vaginal Delivery     21 y.o. yo G1P1001 at 4125w3d was admitted in Active Labor on 12/24/2016. Patient had an uncomplicated labor course as follows:  Membrane Rupture Time/Date: 1:56 AM ,12/25/2016   Intrapartum Procedures: Episiotomy: None [1]                                         Lacerations:  None [1]  Patient had a delivery of a Viable infant. 12/25/2016  Information for the patient's newborn:  Justice BritainVerdell, Boy La'Ge [[id number]][[id number]]  Delivery Method: Vaginal, Spontaneous Delivery(Filed from Delivery Summary)    Pateint had an uncomplicated postpartum course.  She is ambulating, tolerating a regular diet, passing flatus, and urinating well. Patient is discharged home in stable condition on 12/27/16.   Physical exam  Vitals:   12/25/16 2139 12/26/16 0531 12/26/16 1757 12/27/16 0604  BP: (!) 103/53 (!) 109/57 119/63 (!) 99/54  Pulse: 68 72 61 62  Resp: 20 18 18 18   Temp: 98.7 F (37.1 C) 98.4 F (36.9 C) 98.1 F (36.7 C) (!) 97.4 F (36.3 C)  TempSrc: Oral Oral Oral Oral  SpO2:      Weight:      Height:       General: alert, cooperative and no distress Lochia: appropriate Uterine Fundus: firm Incision: N/A DVT Evaluation: No evidence of DVT seen on physical exam. Labs: Lab Results  Component Value Date   WBC 7.1 12/24/2016   HGB 11.6 (L) 12/24/2016   HCT 34.7 (L) 12/24/2016   MCV 85.3 12/24/2016   PLT 205 12/24/2016   No flowsheet data found.  Discharge instruction: per After Visit Summary and "Baby and Me Booklet".  After visit meds:  Allergies as of 12/27/2016   No Known Allergies     Medication List    TAKE these medications   cyclobenzaprine 10 MG tablet Commonly known as:  FLEXERIL Take 1 tablet (10 mg total) by mouth 3 (three) times daily as needed for muscle spasms.   ibuprofen 600 MG tablet Commonly known as:  ADVIL,MOTRIN Take 1 tablet (600 mg total) every 6 (six) hours by mouth.   norethindrone 0.35 MG tablet Commonly known as:  MICRONOR,CAMILA,ERRIN Take 1 tablet (0.35 mg total) daily by mouth. Start taking on:  01/09/2017   senna-docusate 8.6-50 MG tablet Commonly known as:  Senokot-S Take 2  tablets at bedtime as needed by mouth for mild constipation.   VITAFOL GUMMIES 3.33-0.333-34.8 MG Chew TK 3 GUMMIES DAILY UTD       Diet: routine diet  Activity: Advance as tolerated. Pelvic rest for 6 weeks.   Outpatient follow up:6 weeks Follow up Appt: Future Appointments  Date Time Provider Department Center  12/29/2016  7:30 AM [facility] ROOM [facility] None   Follow up Visit: Follow-up Information    Department, [facility]. Schedule an appointment as soon as possible for a visit.   Why:  For psotpartum visit in 6 weeks Contact information: [address] [place] [address] [phone]          Postpartum contraception: Progesterone only pills  Newborn Data: Live born female  Birth Weight: 6 lb 8.8 oz (2971 g) APGAR: 7, 9  Newborn Delivery   Birth date/time:  12/25/2016 14:32:11 Delivery type:  Vaginal, Spontaneous     Baby Feeding: Breast Disposition:home with mother   12/27/2016 Caryl Ada, DO

## 2016-12-29 ENCOUNTER — Encounter (HOSPITAL_COMMUNITY): Payer: Medicaid Other

## 2016-12-29 ENCOUNTER — Inpatient Hospital Stay (HOSPITAL_COMMUNITY): Admission: RE | Admit: 2016-12-29 | Payer: Medicaid Other | Source: Ambulatory Visit

## 2017-04-19 ENCOUNTER — Emergency Department (HOSPITAL_COMMUNITY): Payer: Self-pay

## 2017-04-19 ENCOUNTER — Emergency Department (HOSPITAL_COMMUNITY)
Admission: EM | Admit: 2017-04-19 | Discharge: 2017-04-19 | Disposition: A | Discharge status: Home / Self Care | Payer: Self-pay | Attending: Emergency Medicine | Admitting: Emergency Medicine

## 2017-04-19 ENCOUNTER — Encounter (HOSPITAL_COMMUNITY): Payer: Self-pay | Admitting: Emergency Medicine

## 2017-04-19 ENCOUNTER — Other Ambulatory Visit: Payer: Self-pay

## 2017-04-19 DIAGNOSIS — R110 Nausea: Secondary | ICD-10-CM | POA: Diagnosis not present

## 2017-04-19 DIAGNOSIS — R0602 Shortness of breath: Secondary | ICD-10-CM | POA: Diagnosis not present

## 2017-04-19 DIAGNOSIS — M549 Dorsalgia, unspecified: Secondary | ICD-10-CM | POA: Insufficient documentation

## 2017-04-19 DIAGNOSIS — R072 Precordial pain: Secondary | ICD-10-CM | POA: Insufficient documentation

## 2017-04-19 DIAGNOSIS — Z79899 Other long term (current) drug therapy: Secondary | ICD-10-CM | POA: Insufficient documentation

## 2017-04-19 LAB — BASIC METABOLIC PANEL
Anion gap: 10 (ref 5–15)
BUN: 10 mg/dL (ref 6–20)
CALCIUM: 9.5 mg/dL (ref 8.9–10.3)
CO2: 23 mmol/L (ref 22–32)
Chloride: 106 mmol/L (ref 101–111)
Creatinine, Ser: 0.69 mg/dL (ref 0.44–1.00)
GLUCOSE: 88 mg/dL (ref 65–99)
POTASSIUM: 3.9 mmol/L (ref 3.5–5.1)
Sodium: 139 mmol/L (ref 135–145)

## 2017-04-19 LAB — I-STAT TROPONIN, ED: TROPONIN I, POC: 0 ng/mL (ref 0.00–0.08)

## 2017-04-19 LAB — CBC
HEMATOCRIT: 39.9 % (ref 36.0–46.0)
HEMOGLOBIN: 13.5 g/dL (ref 12.0–15.0)
MCH: 29.4 pg (ref 26.0–34.0)
MCHC: 33.8 g/dL (ref 30.0–36.0)
MCV: 86.9 fL (ref 78.0–100.0)
Platelets: 259 10*3/uL (ref 150–400)
RBC: 4.59 MIL/uL (ref 3.87–5.11)
RDW: 13 % (ref 11.5–15.5)
WBC: 4.1 10*3/uL (ref 4.0–10.5)

## 2017-04-19 LAB — I-STAT BETA HCG BLOOD, ED (MC, WL, AP ONLY)

## 2017-04-19 NOTE — ED Notes (Signed)
Pt spoke with dr Patria Manecampos-- when this nurse obtained discharge instructions, pt had already left.

## 2017-04-19 NOTE — ED Provider Notes (Signed)
[facility] EMERGENCY DEPARTMENT Provider Note   CSN: [id number] Arrival date & time: 04/19/17  9528     History   Chief Complaint Chief Complaint  Patient presents with  . Chest Pain  . Back Pain    HPI La'Ge Schmierer is a 22 y.o. female.  HPI Patient is a healthy 22 year old female with no significant past medical history he reports developing chest pressure which radiated to her back and into her left arm last night.  She reports some transient nausea and shortness of breath.  She denies fevers and chills.  At time my evaluation she reports resolution of all of her symptoms.  No recent cough.  No history of DVT or pulmonary embolism.  Denies pleuritic type pain.  No known family history of cardiac disease or venous thromboembolic disease.  No unilateral leg swelling.  No recent travel or surgery.  Asymptomatic at this time.  She has been in the emergency department for many hours and she is requesting her lab work and to be discharged.   Past Medical History:  Diagnosis Date  . Depression     Patient Active Problem List   Diagnosis Date Noted  . Normal labor 12/24/2016    Past Surgical History:  Procedure Laterality Date  . NO PAST SURGERIES      OB History    Gravida Para Term Preterm AB Living   1 1 1     1    SAB TAB Ectopic Multiple Live Births         0 1       Home Medications    Prior to Admission medications   Medication Sig Start Date End Date Taking? Authorizing Provider  cyclobenzaprine (FLEXERIL) 10 MG tablet Take 1 tablet (10 mg total) by mouth 3 (three) times daily as needed for muscle spasms. 11/13/16   Donette Larry, CNM  ibuprofen (ADVIL,MOTRIN) 600 MG tablet Take 1 tablet (600 mg total) every 6 (six) hours by mouth. 12/27/16   Pincus Large, DO  norethindrone (MICRONOR,CAMILA,ERRIN) 0.35 MG tablet Take 1 tablet (0.35 mg total) daily by mouth. 01/09/17   Pincus Large, DO  Prenatal Vit-Fe Phos-FA-Omega (VITAFOL GUMMIES)  3.33-0.333-34.8 MG CHEW TK 3 GUMMIES DAILY UTD 12/22/16   [provider]  senna-docusate (SENOKOT-S) 8.6-50 MG tablet Take 2 tablets at bedtime as needed by mouth for mild constipation. 12/27/16   Pincus Large, DO    Family History Family History  Problem Relation Age of Onset  . Hypertension Mother   . Cancer Father   . Hypertension Maternal Grandmother     Social History Social History   Tobacco Use  . Smoking status: Never Smoker  . Smokeless tobacco: Never Used  Substance Use Topics  . Alcohol use: No  . Drug use: No     Allergies   Patient has no known allergies.   Review of Systems Review of Systems  All other systems reviewed and are negative.    Physical Exam Updated Vital Signs BP 107/66 (BP Location: Right Arm)   Pulse (!) 59   Temp 98.5 F (36.9 C) (Oral)   Resp 18   SpO2 100%   Breastfeeding? Yes   Physical Exam  Constitutional: She is oriented to person, place, and time. She appears well-developed and well-nourished. No distress.  HENT:  Head: Normocephalic and atraumatic.  Eyes: EOM are normal.  Neck: Normal range of motion.  Cardiovascular: Normal rate, regular rhythm and normal heart sounds.  Pulmonary/Chest:  Effort normal and breath sounds normal.  Abdominal: Soft. She exhibits no distension. There is no tenderness.  Musculoskeletal: Normal range of motion.  Neurological: She is alert and oriented to person, place, and time.  Skin: Skin is warm and dry.  Psychiatric: She has a normal mood and affect. Judgment normal.  Nursing note and vitals reviewed.    ED Treatments / Results  Labs (all labs ordered are listed, but only abnormal results are displayed) Labs Reviewed  BASIC METABOLIC PANEL  CBC  I-STAT TROPONIN, ED  I-STAT BETA HCG BLOOD, ED ([facility], [facility], [facility] ONLY)    EKG  EKG Interpretation  Date/Time:  Monday April 19 2017 03:20:42 EST Ventricular Rate:  61 PR Interval:  150 QRS Duration: 94 QT  Interval:  408 QTC Calculation: 410 R Axis:   47 Text Interpretation:  Sinus rhythm with Premature atrial complexes Incomplete right bundle branch block Borderline ECG No old tracing to compare Confirmed by Ward, Baxter HireKristen [phone]([id number]) on 04/19/2017 4:55:03 AM Also confirmed by Ward, Baxter HireKristen [phone]), editor Elita QuickWatlington, [place] [phone])  on 04/19/2017 6:39:44 AM       Radiology Dg Chest 2 View  Result Date: 04/19/2017 CLINICAL DATA:  22 year old female with chest pain. EXAM: CHEST  2 VIEW COMPARISON:  Chest radiograph dated 07/28/2007 FINDINGS: The lungs are clear. There is no pleural effusion or pneumothorax. The cardiac silhouette is within normal limits. Mild scoliosis. No acute osseous pathology. IMPRESSION: No active cardiopulmonary disease. Electronically Signed   By: Elgie CollardArash  Radparvar M.D.   On: 04/19/2017 04:11    Procedures Procedures (including critical care time)  Medications Ordered in ED Medications - No data to display   Initial Impression / Assessment and Plan / ED Course  I have reviewed the triage vital signs and the nursing notes.  Pertinent labs & imaging results that were available during my care of the patient were reviewed by me and considered in my medical decision making (see chart for details).     Overall well-appearing.  Doubt ACS.  EKG without ischemic changes.  Troponin negative.  Patient is PERC negative.  Doubt PE.  Suspect GERD.  Patient will need follow-up with a primary care physician.  I recommend that she return to the ER for any new or worsening symptoms.    Final Clinical Impressions(s) / ED Diagnoses   Final diagnoses:  Precordial pain    ED Discharge Orders    None       Azalia Bilisampos, Salahuddin Arismendez, MD 04/19/17 [phone]

## 2017-04-19 NOTE — ED Triage Notes (Signed)
C/o chest pressure that radiates to back and left arm since last night.  Also reports nausea, sob, and pelvic pain.  States she took 2 negative home pregnancy test.

## 2017-04-19 NOTE — ED Notes (Signed)
Pt was at work last night-- pain got worse-- EMS checked pt- pt refused to come with EMS, chose to come on own-- pain free at present.

## 2017-04-19 NOTE — ED Notes (Signed)
Pt comes to this tech and states she needs to leave to pickup her baby.

## 2017-04-19 NOTE — Discharge Instructions (Signed)
Please call a primary care physician for follow up

## 2017-06-06 DIAGNOSIS — B373 Candidiasis of vulva and vagina: Secondary | ICD-10-CM | POA: Diagnosis not present

## 2017-06-07 DIAGNOSIS — B373 Candidiasis of vulva and vagina: Secondary | ICD-10-CM | POA: Diagnosis not present

## 2017-06-15 DIAGNOSIS — J069 Acute upper respiratory infection, unspecified: Secondary | ICD-10-CM | POA: Diagnosis not present

## 2017-09-07 ENCOUNTER — Emergency Department (HOSPITAL_COMMUNITY)
Admission: EM | Admit: 2017-09-07 | Discharge: 2017-09-07 | Disposition: A | Discharge status: Home / Self Care | Payer: BLUE CROSS/BLUE SHIELD | Attending: Emergency Medicine | Admitting: Emergency Medicine

## 2017-09-07 ENCOUNTER — Encounter (HOSPITAL_COMMUNITY): Payer: Self-pay

## 2017-09-07 DIAGNOSIS — R1010 Upper abdominal pain, unspecified: Secondary | ICD-10-CM | POA: Insufficient documentation

## 2017-09-07 DIAGNOSIS — R51 Headache: Secondary | ICD-10-CM | POA: Insufficient documentation

## 2017-09-07 DIAGNOSIS — R109 Unspecified abdominal pain: Secondary | ICD-10-CM | POA: Diagnosis not present

## 2017-09-07 DIAGNOSIS — R197 Diarrhea, unspecified: Secondary | ICD-10-CM | POA: Insufficient documentation

## 2017-09-07 DIAGNOSIS — R110 Nausea: Secondary | ICD-10-CM | POA: Insufficient documentation

## 2017-09-07 DIAGNOSIS — R509 Fever, unspecified: Secondary | ICD-10-CM | POA: Diagnosis not present

## 2017-09-07 DIAGNOSIS — Z79899 Other long term (current) drug therapy: Secondary | ICD-10-CM | POA: Insufficient documentation

## 2017-09-07 MED ORDER — ACETAMINOPHEN 325 MG PO TABS
650.0000 mg | ORAL_TABLET | Freq: Once | ORAL | Status: AC
Start: 2017-09-07 — End: 2017-09-07
  Administered 2017-09-07: 650 mg via ORAL
  Filled 2017-09-07: qty 2

## 2017-09-07 MED ORDER — ONDANSETRON 4 MG PO TBDP: 4.0000 mg | ORAL_TABLET | Freq: Three times a day (TID) | ORAL | 0 refills | Status: DC | PRN

## 2017-09-07 MED ORDER — ONDANSETRON 4 MG PO TBDP
4.0000 mg | ORAL_TABLET | Freq: Once | ORAL | Status: DC
  Filled 2017-09-07: qty 1

## 2017-09-07 NOTE — Discharge Instructions (Signed)
Please read and follow all provided instructions.  Your diagnoses today include:  1. Diarrhea, unspecified type     Tests performed today include:  Vital signs. See below for your results today.   Medications prescribed:   Zofran (ondansetron) - for nausea and vomiting  Take any prescribed medications only as directed.  Home care instructions:   Follow any educational materials contained in this packet.   Your abdominal pain, nausea and diarrhea may be caused by a viral gastroenteritis also called 'stomach flu'. You should rest for the next several days. Keep drinking plenty of fluids and use the medicine for nausea as directed.    Drink clear liquids for the next 24 hours and introduce solid foods slowly after 24 hours using the b.r.a.t. diet (Bananas, Rice, Applesauce, Toast, Yogurt).    Follow-up instructions: Please follow-up with your primary care provider in the next 2 days for further evaluation of your symptoms. If you are not feeling better in 48 hours you may have a condition that is more serious and you need re-evaluation.   Return instructions:  SEEK IMMEDIATE MEDICAL ATTENTION IF:  If you have pain that does not go away or becomes severe   A temperature above 101F develops   Repeated vomiting occurs (multiple episodes)   If you have pain that becomes localized to portions of the abdomen. The right side could possibly be appendicitis. In an adult, the left lower portion of the abdomen could be colitis or diverticulitis.   Blood is being passed in stools or vomit (bright red or black tarry stools)   You develop chest pain, difficulty breathing, dizziness or fainting, or become confused, poorly responsive, or inconsolable (young children)  If you have any other emergent concerns regarding your health  Additional Information: Abdominal (belly) pain can be caused by many things. Your caregiver performed an examination and possibly ordered blood/urine tests and  imaging (CT scan, x-rays, ultrasound). Many cases can be observed and treated at home after initial evaluation in the emergency department. Even though you are being discharged home, abdominal pain can be unpredictable. Therefore, you need a repeated exam if your pain does not resolve, returns, or worsens. Most patients with abdominal pain don't have to be admitted to the hospital or have surgery, but serious problems like appendicitis and gallbladder attacks can start out as nonspecific pain. Many abdominal conditions cannot be diagnosed in one visit, so follow-up evaluations are very important.  Your vital signs today were: BP 117/74 (BP Location: Right Arm)    Pulse (!) 103    Temp 100.1 F (37.8 C) (Oral)    Resp 16    Ht 5' 2.5" (1.588 m)    LMP 08/06/2017    SpO2 100%    Breastfeeding? Yes    BMI 27.00 kg/m  If your blood pressure (bp) was elevated above 135/85 this visit, please have this repeated by your doctor within one month. --------------

## 2017-09-07 NOTE — ED Notes (Signed)
Patient verbalizes understanding of discharge instructions. Opportunity for questioning and answers were provided. Armband removed by staff, pt discharged from ED ambulatory.   

## 2017-09-07 NOTE — ED Triage Notes (Signed)
Pt reports she also took a Laxative several hours ago because her mom told her to.

## 2017-09-07 NOTE — ED Triage Notes (Addendum)
Onset this morning diarrhea x 10, watery.  Onset yesterday headache.  Pt ate cake from [employer] last night and thinks this is what caused diarrhea.

## 2017-09-07 NOTE — ED Provider Notes (Signed)
[facility]   CSN: [id number] Arrival date & time: 09/07/17  1135     History   Chief Complaint Chief Complaint  Patient presents with  . Diarrhea  . Headache    HPI Emily Cantu is a 22 y.o. female.  Patient with no past surgical history presents with complaint of multiple episodes of nonbloody, watery stools over the past day.  Patient has had nausea but no vomiting.  She reports abdominal cramping in the upper abdomen with radiation to her back.  She is currently breast-feeding.  She took magnesium citrate prior to arrival.  Last stooling proximately 10 minutes prior to my interview.  Temperature noted to be 100.1 F on arrival.  Patient's significant other ate the same things last night and he had several formed bowel movements today but has not vomited and has not had watery stool.  Patient denies any urinary symptoms.  The onset of this condition was acute. The course is constant. Aggravating factors: none. Alleviating factors: none.       Past Medical History:  Diagnosis Date  . Depression     Patient Active Problem List   Diagnosis Date Noted  . Normal labor 12/24/2016    Past Surgical History:  Procedure Laterality Date  . NO PAST SURGERIES       OB History    Gravida  1   Para  1   Term  1   Preterm      AB      Living  1     SAB      TAB      Ectopic      Multiple  0   Live Births  1            Home Medications    Prior to Admission medications   Medication Sig Start Date End Date Taking? Authorizing Provider  cyclobenzaprine (FLEXERIL) 10 MG tablet Take 1 tablet (10 mg total) by mouth 3 (three) times daily as needed for muscle spasms. 11/13/16   Donette LarryBhambri, Melanie, CNM  ibuprofen (ADVIL,MOTRIN) 600 MG tablet Take 1 tablet (600 mg total) every 6 (six) hours by mouth. 12/27/16   Pincus LargePhelps, Jazma Y, DO  norethindrone (MICRONOR,CAMILA,ERRIN) 0.35 MG tablet Take 1 tablet (0.35 mg total)  daily by mouth. 01/09/17   Pincus LargePhelps, Jazma Y, DO  Prenatal Vit-Fe Phos-FA-Omega (VITAFOL GUMMIES) 3.33-0.333-34.8 MG CHEW TK 3 GUMMIES DAILY UTD 12/22/16   [provider]  senna-docusate (SENOKOT-S) 8.6-50 MG tablet Take 2 tablets at bedtime as needed by mouth for mild constipation. 12/27/16   Pincus LargePhelps, Jazma Y, DO    Family History Family History  Problem Relation Age of Onset  . Hypertension Mother   . Cancer Father   . Hypertension Maternal Grandmother     Social History Social History   Tobacco Use  . Smoking status: Never Smoker  . Smokeless tobacco: Never Used  Substance Use Topics  . Alcohol use: No  . Drug use: No     Allergies   Patient has no known allergies.   Review of Systems Review of Systems  Constitutional: Negative for appetite change and fever.  HENT: Negative for rhinorrhea and sore throat.   Eyes: Negative for redness.  Respiratory: Negative for cough.   Cardiovascular: Negative for chest pain.  Gastrointestinal: Positive for abdominal pain, diarrhea and nausea. Negative for blood in stool and vomiting.       Negative for hematemesis  Genitourinary: Negative for  dysuria.  Musculoskeletal: Negative for myalgias.  Skin: Negative for rash.  Neurological: Negative for light-headedness.     Physical Exam Updated Vital Signs BP 117/74 (BP Location: Right Arm)   Pulse (!) 103   Temp 100.1 F (37.8 C) (Oral)   Resp 16   Ht 5' 2.5" (1.588 m)   LMP 08/06/2017   SpO2 100%   Breastfeeding? Yes   BMI 27.00 kg/m   Physical Exam  Constitutional: She appears well-developed and well-nourished.  HENT:  Head: Normocephalic and atraumatic.  Eyes: Conjunctivae are normal. Right eye exhibits no discharge. Left eye exhibits no discharge.  Neck: Normal range of motion. Neck supple.  Cardiovascular: Normal rate, regular rhythm and normal heart sounds.  Normal HR at time of my exam (90's)  Pulmonary/Chest: Effort normal and breath sounds normal.    Abdominal: Soft. She exhibits no mass. There is no tenderness.  No tenderness exhibited at time of my exam.   Neurological: She is alert.  Skin: Skin is warm and dry.  Psychiatric: She has a normal mood and affect.  Nursing note and vitals reviewed.    ED Treatments / Results  Labs (all labs ordered are listed, but only abnormal results are displayed) Labs Reviewed - No data to display  EKG None  Radiology No results found.  Procedures Procedures (including critical care time)  Medications Ordered in ED Medications  ondansetron (ZOFRAN-ODT) disintegrating tablet 4 mg (0 mg Oral Hold 09/07/17 1731)  acetaminophen (TYLENOL) tablet 650 mg (650 mg Oral Given 09/07/17 1731)     Initial Impression / Assessment and Plan / ED Course  I have reviewed the triage vital signs and the nursing notes.  Pertinent labs & imaging results that were available during my care of the patient were reviewed by me and considered in my medical decision making (see chart for details).     Patient seen and examined.  Patient is well-appearing.  Will treat with Tylenol and Zofran.  Will avoid Bentyl and Imodium as patient is breast-feeding.  Abdominal exam is reassuring.  Patient appears well.  Vital signs reviewed and are as follows: BP 117/74 (BP Location: Right Arm)   Pulse (!) 103   Temp 100.1 F (37.8 C) (Oral)   Resp 16   Ht 5' 2.5" (1.588 m)   LMP 08/06/2017   SpO2 100%   Breastfeeding? Yes   BMI 27.00 kg/m   6:21 PM patient is drinking ginger ale and eating graham crackers without vomiting.  Abdominal exam unchanged.  The patient was urged to return to the Emergency Department immediately with worsening of current symptoms, worsening abdominal pain, persistent vomiting, blood noted in stools, fever, or any other concerns. The patient verbalized understanding.    Final Clinical Impressions(s) / ED Diagnoses   Final diagnoses:  Diarrhea, unspecified type   Patient with symptoms  consistent with viral gastroenteritis. Vitals are stable, low-grade fever.  No signs of dehydration, tolerating PO's. Lungs are clear. No focal abdominal pain. Low concern for appendicitis, cholecystitis, pancreatitis, ruptured viscus, UTI, kidney stone, aortic dissection, aortic aneurysm or other emergent abdominal etiology. Supportive therapy indicated with return if symptoms worsen. Patient counseled.   ED Discharge Orders        Ordered    ondansetron (ZOFRAN ODT) 4 MG disintegrating tablet  Every 8 hours PRN     09/07/17 1819       Renne Crigler, Cordelia Poche 09/07/17 1823    Gwyneth Sprout, MD 09/09/17 2154

## 2017-10-05 DIAGNOSIS — Z13220 Encounter for screening for lipoid disorders: Secondary | ICD-10-CM | POA: Diagnosis not present

## 2017-10-05 DIAGNOSIS — Z124 Encounter for screening for malignant neoplasm of cervix: Secondary | ICD-10-CM | POA: Diagnosis not present

## 2017-10-05 DIAGNOSIS — N760 Acute vaginitis: Secondary | ICD-10-CM | POA: Diagnosis not present

## 2017-10-05 DIAGNOSIS — Z0000 Encounter for general adult medical examination without abnormal findings: Secondary | ICD-10-CM | POA: Diagnosis not present

## 2017-10-05 DIAGNOSIS — Z131 Encounter for screening for diabetes mellitus: Secondary | ICD-10-CM | POA: Diagnosis not present

## 2018-01-04 DIAGNOSIS — Z124 Encounter for screening for malignant neoplasm of cervix: Secondary | ICD-10-CM | POA: Diagnosis not present

## 2018-01-04 DIAGNOSIS — Z23 Encounter for immunization: Secondary | ICD-10-CM | POA: Diagnosis not present

## 2018-01-04 DIAGNOSIS — N760 Acute vaginitis: Secondary | ICD-10-CM | POA: Diagnosis not present

## 2018-01-04 DIAGNOSIS — R630 Anorexia: Secondary | ICD-10-CM | POA: Diagnosis not present

## 2018-05-25 IMAGING — US US MFM OB LIMITED
1 series · 15 of 28 positions shown · non-contrast
Comparison: none

[Series 1: us mfm ob limited · 34 acquisitions, 15 frames shown]
[im 1/34]
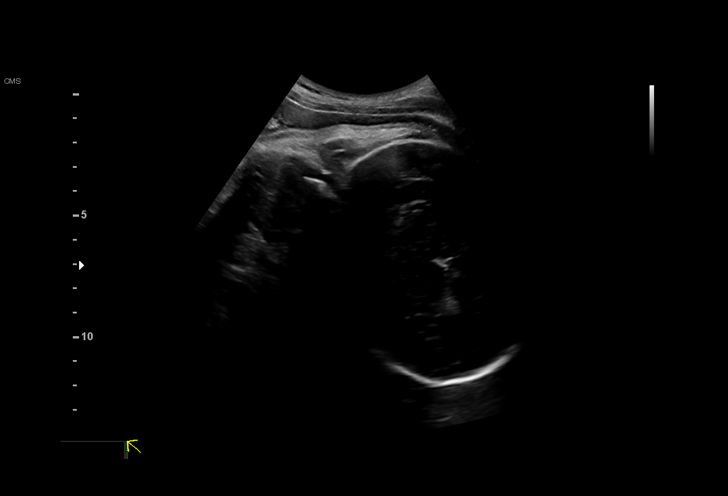
[im 3/34]
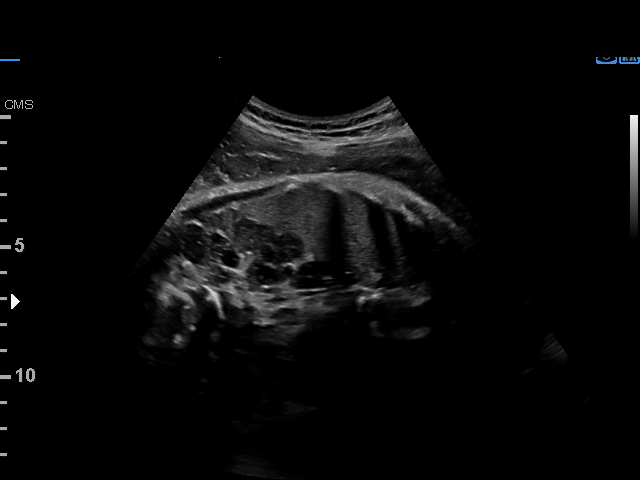
[im 5/34]
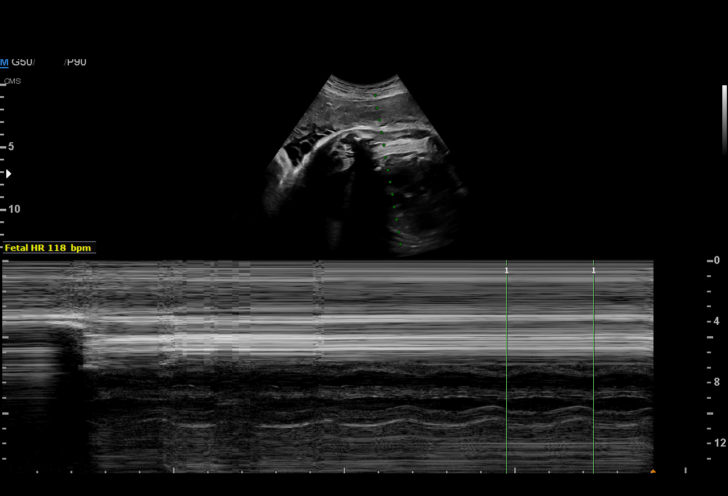
[im 8/34]
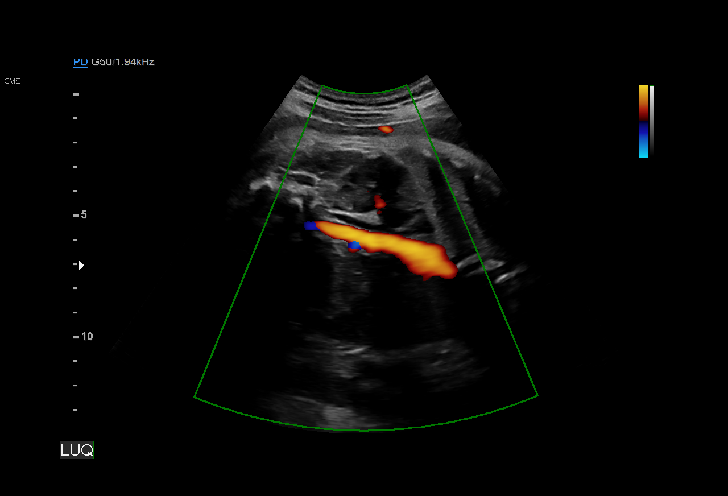
[im 10/34]
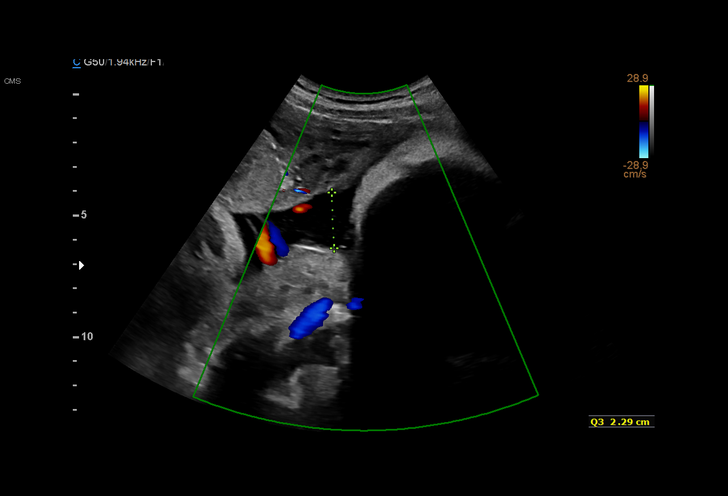
[im 13/34]
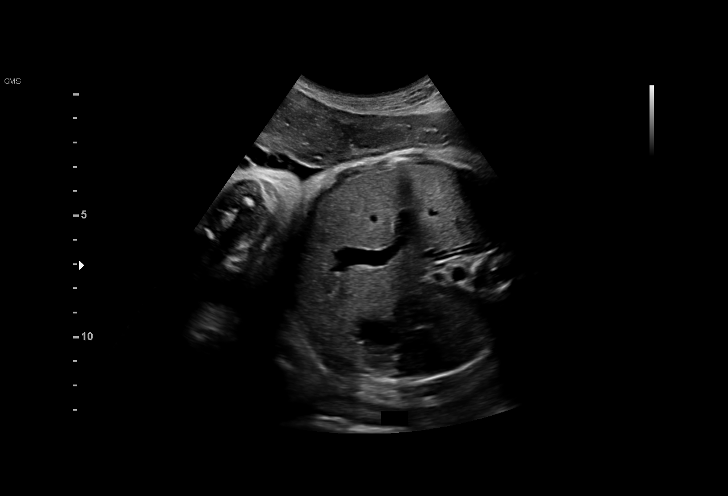
[im 15/34]
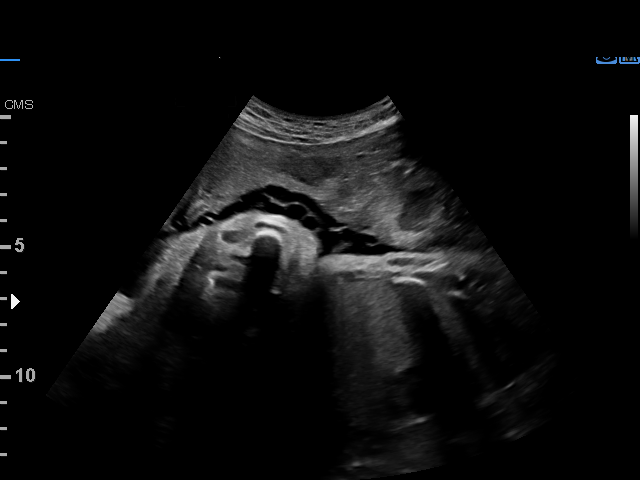
[im 18/34]
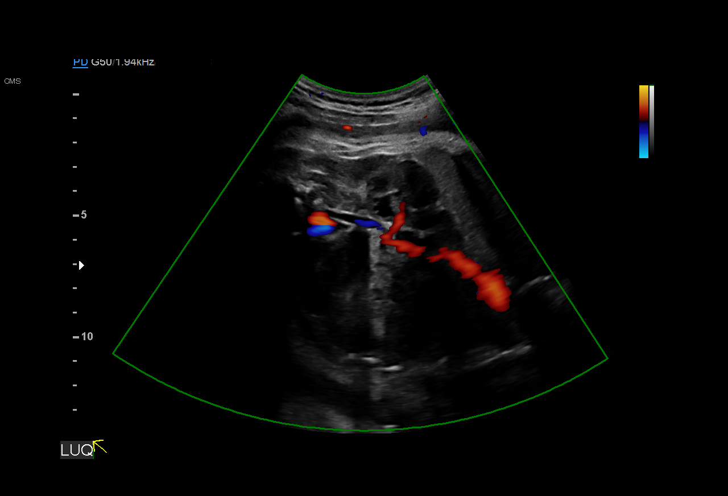
[im 19/34]
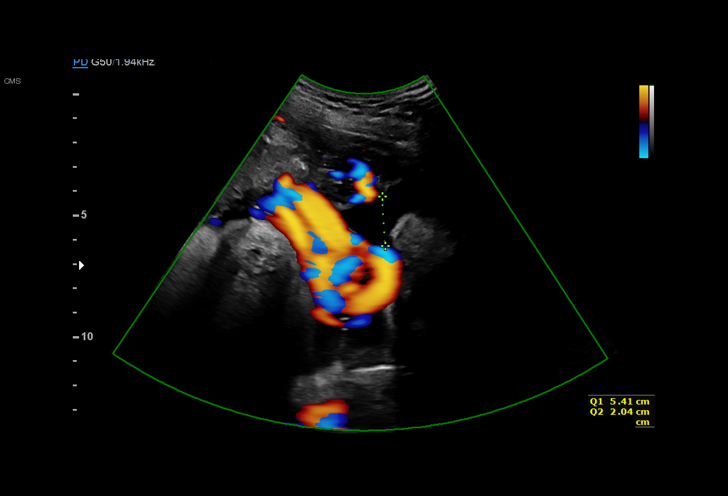
[im 21/34]
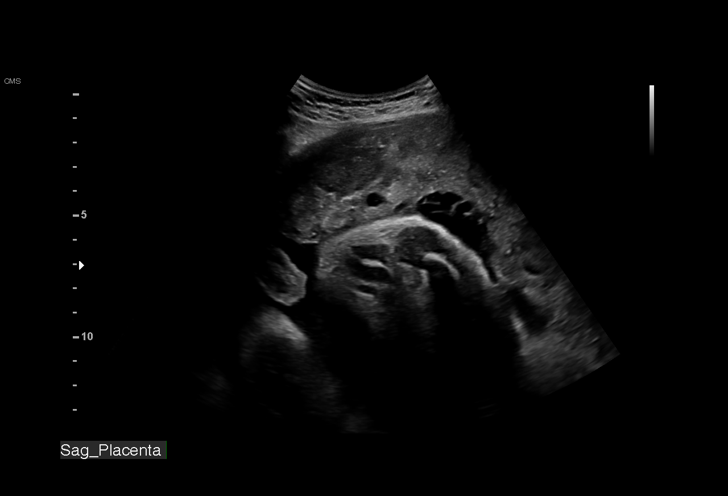
[im 24/34]
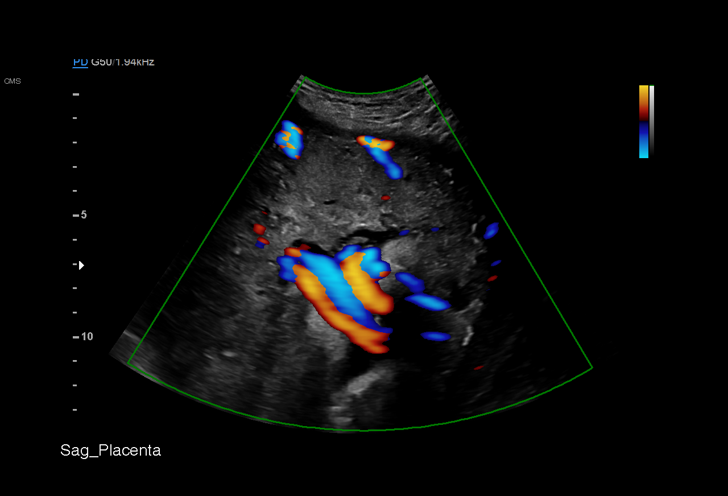
[im 26/34]
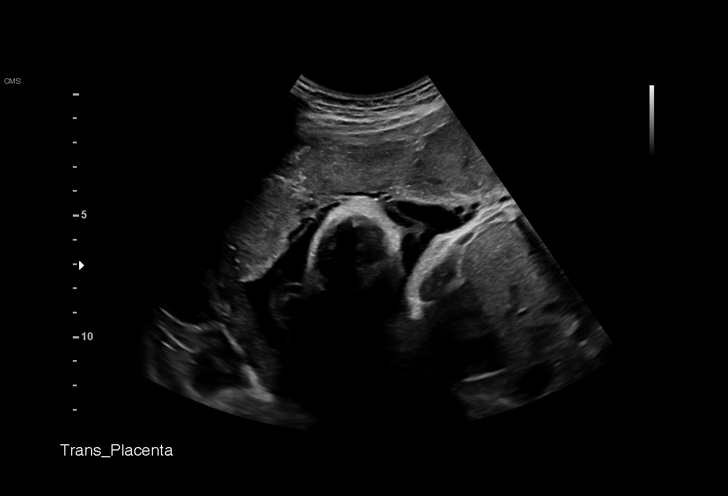
[im 29/34]
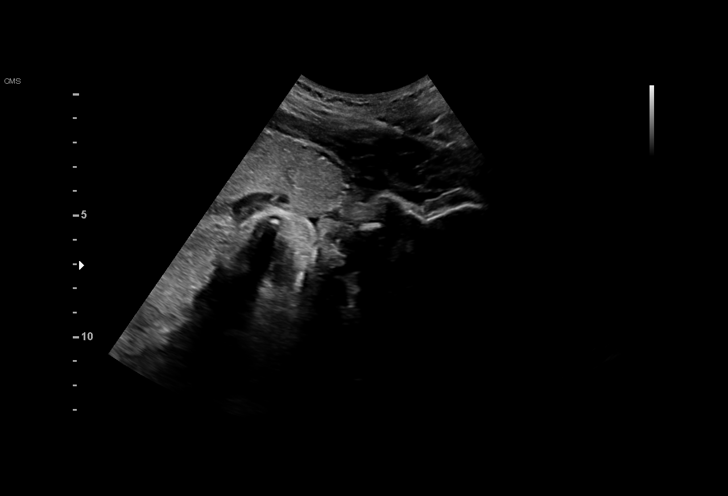
[im 31/34]
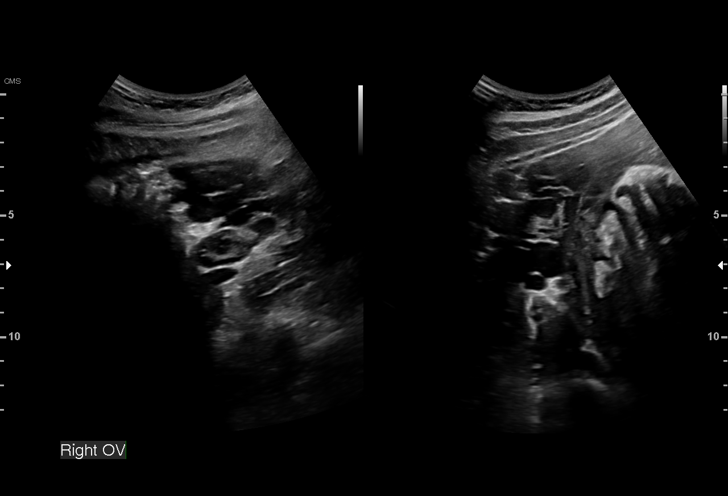
[im 34/34]
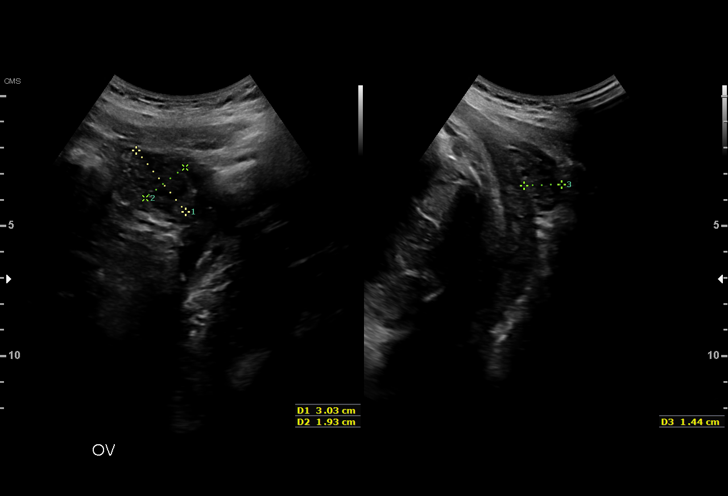

[15 of 28 positions shown; findings below may reference images not displayed]

[REDACTED]-
Faculty Physician

1  AHSAN SHIRE             [id number]      [phone]     [id number]
2  AHSAN SHIRE             [id number]      [phone]     [id number]
Indications

40 weeks gestation of pregnancy
Postdate pregnancy (40-42 weeks)
OB History

Gravidity:    1         Term:   0        Prem:   0        SAB:   0
TOP:          0       Ectopic:  0        Living: 0
Fetal Evaluation

Num Of Fetuses:     1
Fetal Heart         118
Rate(bpm):
Cardiac Activity:   Observed
Presentation:       Cephalic
Placenta:           Anterior, above cervical os
P. Cord Insertion:  Visualized

Amniotic Fluid
AFI FV:      Subjectively within normal limits

AFI Sum(cm)     %Tile       Largest Pocket(cm)
7.91            13

RUQ(cm)                     LUQ(cm)        LLQ(cm)
4.11
Biophysical Evaluation
Amniotic F.V:   Within normal limits       F. Tone:        Observed
F. Movement:    Observed                   Score:          [DATE]
F. Breathing:   Observed
Gestational Age

LMP:           40w 2d        Date:  03/17/16                 EDD:   12/22/16
Best:          40w 2d     Det. By:  LMP  (03/17/16)          EDD:   12/22/16
Cervix Uterus Adnexa

Cervix
Not visualized (advanced GA >74wks)

Uterus
No abnormality visualized.

Left Ovary
Within normal limits.

Right Ovary
Within normal limits.

Cul De Sac:   No free fluid seen.

Adnexa:       No abnormality visualized.
Impression

Single living intrauterine pregnancy at 40w 2d.
Cephalic presentation.
Normal amniotic fluid volume.
BPP [DATE].
Recommendations

Follow-up ultrasounds as clinically indicated.

## 2018-05-28 HISTORY — PX: THERAPEUTIC ABORTION: SHX798

## 2018-07-15 ENCOUNTER — Emergency Department (HOSPITAL_COMMUNITY): Payer: Medicaid Other

## 2018-07-15 ENCOUNTER — Emergency Department (HOSPITAL_COMMUNITY)
Admission: EM | Admit: 2018-07-15 | Discharge: 2018-07-15 | Disposition: A | Discharge status: Home / Self Care | Payer: Medicaid Other | Attending: Emergency Medicine | Admitting: Emergency Medicine

## 2018-07-15 ENCOUNTER — Other Ambulatory Visit: Payer: Self-pay

## 2018-07-15 ENCOUNTER — Encounter (HOSPITAL_COMMUNITY): Payer: Self-pay | Admitting: Emergency Medicine

## 2018-07-15 DIAGNOSIS — N939 Abnormal uterine and vaginal bleeding, unspecified: Secondary | ICD-10-CM | POA: Diagnosis present

## 2018-07-15 HISTORY — DX: Reserved for inherently not codable concepts without codable children: IMO0001

## 2018-07-15 HISTORY — DX: Complete or unspecified spontaneous abortion without complication: O03.9

## 2018-07-15 LAB — URINALYSIS, ROUTINE W REFLEX MICROSCOPIC
Bilirubin Urine: NEGATIVE
Glucose, UA: NEGATIVE mg/dL
Ketones, ur: NEGATIVE mg/dL
Nitrite: NEGATIVE
Protein, ur: 30 mg/dL — AB
Specific Gravity, Urine: 1.026 (ref 1.005–1.030)
pH: 6 (ref 5.0–8.0)

## 2018-07-15 LAB — COMPREHENSIVE METABOLIC PANEL
ALT: 12 U/L (ref 0–44)
AST: 17 U/L (ref 15–41)
Albumin: 3.9 g/dL (ref 3.5–5.0)
Alkaline Phosphatase: 62 U/L (ref 38–126)
Anion gap: 9 (ref 5–15)
BUN: 15 mg/dL (ref 6–20)
CO2: 24 mmol/L (ref 22–32)
Calcium: 9.7 mg/dL (ref 8.9–10.3)
Chloride: 107 mmol/L (ref 98–111)
Creatinine, Ser: 0.7 mg/dL (ref 0.44–1.00)
GFR calc Af Amer: >60 mL/min (ref >60)
GFR calc non Af Amer: >60 mL/min (ref >60)
Glucose, Bld: 87 mg/dL (ref 70–99)
Potassium: 4.2 mmol/L (ref 3.5–5.1)
Sodium: 140 mmol/L (ref 135–145)
Total Bilirubin: 0.6 mg/dL (ref 0.3–1.2)
Total Protein: 6.8 g/dL (ref 6.5–8.1)

## 2018-07-15 LAB — CBC
HCT: 36.6 % (ref 36.0–46.0)
Hemoglobin: 11.9 g/dL — ABNORMAL LOW (ref 12.0–15.0)
MCH: 29 pg (ref 26.0–34.0)
MCHC: 32.5 g/dL (ref 30.0–36.0)
MCV: 89.3 fL (ref 80.0–100.0)
Platelets: 255 10*3/uL (ref 150–400)
RBC: 4.1 MIL/uL (ref 3.87–5.11)
RDW: 12.5 % (ref 11.5–15.5)
WBC: 5 10*3/uL (ref 4.0–10.5)
nRBC: 0 % (ref 0.0–0.2)

## 2018-07-15 LAB — I-STAT BETA HCG BLOOD, ED (MC, WL, AP ONLY): I-stat hCG, quantitative: 19.8 m[IU]/mL — ABNORMAL HIGH (ref <5)

## 2018-07-15 LAB — HCG, QUANTITATIVE, PREGNANCY: hCG, Beta Chain, Quant, S: 24 m[IU]/mL — ABNORMAL HIGH (ref <5)

## 2018-07-15 MED ORDER — HYDROCODONE-ACETAMINOPHEN 5-325 MG PO TABS
1.0000 | ORAL_TABLET | Freq: Four times a day (QID) | ORAL | 0 refills | Status: DC | PRN
Start: 2018-07-15 — End: 2018-09-24

## 2018-07-15 NOTE — ED Provider Notes (Signed)
Patient seen/examined in the Emergency Department in conjunction with Advanced Practice Provider [facility] Patient reports abd cramping/vag bleeding since elective abortion last month Exam : awake/alert, no distress, appears comfortable Plan: consult OBGYN Pt updated on plan     Zadie Rhine, MD 07/15/18 [phone]

## 2018-07-15 NOTE — ED Provider Notes (Signed)
[facility]   CSN: [id number] Arrival date & time: 07/15/18  0154    History   Chief Complaint Chief Complaint  Patient presents with  . Vaginal Bleeding    HPI Emily Cantu is a 23 y.o. female.     Patient presents to the emergency department with a chief complaint of vaginal bleeding.  She reports having an abortion on 05/28/2018.  Has had persistent vaginal bleeding and lower abdominal cramps since then.  She states that she uses about 4-5 pads per day.  She called her OB/GYN today, and was advised to come to the emergency department.  She denies any fevers or chills.  Denies any other associated symptoms.  The history is provided by the patient. No language interpreter was used.    Past Medical History:  Diagnosis Date  . Abortion   . Depression     Patient Active Problem List   Diagnosis Date Noted  . Normal labor 12/24/2016    Past Surgical History:  Procedure Laterality Date  . NO PAST SURGERIES       OB History    Gravida  1   Para  1   Term  1   Preterm      AB      Living  1     SAB      TAB      Ectopic      Multiple  0   Live Births  1            Home Medications    Prior to Admission medications   Medication Sig Start Date End Date Taking? Authorizing Provider  ondansetron (ZOFRAN ODT) 4 MG disintegrating tablet Take 1 tablet (4 mg total) by mouth every 8 (eight) hours as needed for nausea or vomiting. 09/07/17   Renne CriglerGeiple, Joshua, PA-C    Family History Family History  Problem Relation Age of Onset  . Hypertension Mother   . Cancer Father   . Hypertension Maternal Grandmother     Social History Social History   Tobacco Use  . Smoking status: Never Smoker  . Smokeless tobacco: Never Used  Substance Use Topics  . Alcohol use: No  . Drug use: No     Allergies   Patient has no known allergies.   Review of Systems Review of Systems  All other systems  reviewed and are negative.    Physical Exam Updated Vital Signs BP 121/63   Pulse (!) 48   Temp 98.7 F (37.1 C) (Oral)   Resp 16   SpO2 100%   Physical Exam Vitals signs and nursing note reviewed.  Constitutional:      General: She is not in acute distress.    Appearance: She is well-developed.  HENT:     Head: Normocephalic and atraumatic.  Eyes:     Conjunctiva/sclera: Conjunctivae normal.  Neck:     Musculoskeletal: Neck supple.  Cardiovascular:     Rate and Rhythm: Normal rate and regular rhythm.     Heart sounds: No murmur.  Pulmonary:     Effort: Pulmonary effort is normal. No respiratory distress.     Breath sounds: Normal breath sounds.  Abdominal:     Palpations: Abdomen is soft.     Tenderness: There is no abdominal tenderness.  Skin:    General: Skin is warm and dry.  Neurological:     Mental Status: She is alert.      ED Treatments /  Results  Labs (all labs ordered are listed, but only abnormal results are displayed) Labs Reviewed  CBC - Abnormal; Notable for the following components:      Result Value   Hemoglobin 11.9 (*)    All other components within normal limits  URINALYSIS, ROUTINE W REFLEX MICROSCOPIC - Abnormal; Notable for the following components:   APPearance HAZY (*)    Hgb urine dipstick LARGE (*)    Protein, ur 30 (*)    Leukocytes,Ua TRACE (*)    Bacteria, UA RARE (*)    All other components within normal limits  I-STAT BETA HCG BLOOD, ED ([facility], [facility], [facility] ONLY) - Abnormal; Notable for the following components:   I-stat hCG, quantitative 19.8 (*)    All other components within normal limits  COMPREHENSIVE METABOLIC PANEL    EKG None  Radiology No results found.  Procedures Procedures (including critical care time)  Medications Ordered in ED Medications - No data to display   Initial Impression / Assessment and Plan / ED Course  I have reviewed the triage vital signs and the nursing notes.  Pertinent labs & imaging  results that were available during my care of the patient were reviewed by me and considered in my medical decision making (see chart for details).        Patient with vaginal bleeding, lower abdominal cramping.  Was recently pregnant and had an abortion.  Sent to the ED by OB/GYN to have ultrasound to rule out retained products of conception.  H&H is stable.  Patient is well-appearing and in no acute distress.  Ultrasound consistent with retained products.  I discussed case with Dr. Earlene Plater from OB/GYN, who recommends quantitative hCG and discharge.  Patient will follow-up in 48 hours for repeat quantitative hCG to assess for new pregnancy.  May require Cytotec at time.  Final Clinical Impressions(s) / ED Diagnoses   Final diagnoses:  Abnormal vaginal bleeding    ED Discharge Orders    None       Roxy Horseman, PA-C 07/15/18 3335    Zadie Rhine, MD 07/15/18 [phone]

## 2018-07-15 NOTE — ED Provider Notes (Signed)
I discussed the case with Dr. Earlene Plater with OB/GYN. She requested a repeat hCG drawn at this time.  She requested to have patient return in 48 hours to the [facility] section of [place] for repeat hCG. At that time, if necessary she will be given Cytotec.  We will defer medications at this time.  Patient is stable in no acute distress. This was discussed with the patient. Short Course of pain medications will be prescribed.  We discussed strict ER return precautions   Zadie Rhine, MD 07/15/18 [phone]

## 2018-07-15 NOTE — Discharge Instructions (Signed)
Please go to the women and children's side at [facility] on Sunday morning 07/17/2018 Tell them that you are pregnant and presenting for a hormone lab draw At that time they may give you medications for your bleeding. Before then, if you have worsening bleeding, worse abdominal pain, feel weak or dizzy you must return to the ER

## 2018-07-15 NOTE — Consult Note (Signed)
Received call from [facility] ED attending regarding patient with vaginal bleeding after TAB 6 weeks ago and positive pregnancy test, [place] with features suggestive of retained products. Patient stable, [place] images reviewed. Will obtain serum HCG today, have patient follow up in 48 hours in [facility]U for repeat HCG. Likely retained products after TAB, however 6 weeks out, will assess for new pregnancy. Reviewed via telephone with [facility] ED attending, who will update patient.   Baldemar Lenis, M.D. Attending [facility] for [employer] [facility])

## 2018-07-15 NOTE — ED Triage Notes (Signed)
Pt had abortion on 05/28/18 (@9wks  4 days).  Reports continued vaginal bleeding.  States some days bright red bleeding 4-5 pads per day and other days light brown bleeding.  Also reports abd cramping.

## 2018-07-17 ENCOUNTER — Inpatient Hospital Stay (HOSPITAL_COMMUNITY)
Admission: AD | Admit: 2018-07-17 | Discharge: 2018-07-17 | Discharge status: Absent without leave | Payer: Medicaid Other | Source: Home / Self Care | Attending: Obstetrics & Gynecology | Admitting: Obstetrics & Gynecology

## 2018-07-17 ENCOUNTER — Other Ambulatory Visit: Payer: Self-pay

## 2018-07-17 ENCOUNTER — Inpatient Hospital Stay (HOSPITAL_COMMUNITY)
Admission: AD | Admit: 2018-07-17 | Discharge: 2018-07-17 | Disposition: A | Discharge status: Home / Self Care | Payer: Medicaid Other | Attending: Obstetrics & Gynecology | Admitting: Obstetrics & Gynecology

## 2018-07-17 ENCOUNTER — Encounter (HOSPITAL_COMMUNITY): Payer: Self-pay | Admitting: Obstetrics and Gynecology

## 2018-07-17 DIAGNOSIS — O034 Incomplete spontaneous abortion without complication: Secondary | ICD-10-CM | POA: Insufficient documentation

## 2018-07-17 LAB — HCG, QUANTITATIVE, PREGNANCY: hCG, Beta Chain, Quant, S: 16 m[IU]/mL — ABNORMAL HIGH (ref <5)

## 2018-07-17 MED ORDER — MISOPROSTOL 200 MCG PO TABS: 200.0000 ug | ORAL_TABLET | Freq: Three times a day (TID) | ORAL | 0 refills | Status: DC

## 2018-07-17 MED ORDER — IBUPROFEN 600 MG PO TABS: 600.0000 mg | ORAL_TABLET | Freq: Four times a day (QID) | ORAL | 0 refills | Status: DC | PRN

## 2018-07-17 NOTE — Discharge Instructions (Signed)
Incomplete Miscarriage A miscarriage is the loss of an unborn baby (fetus) before the 20th week of pregnancy. In an incomplete miscarriage, parts of the fetus or placenta (afterbirth) remain in the body. Most miscarriages happen in the first 3 months of pregnancy. Sometimes, it happens before a woman even knows she is pregnant. Having a miscarriage can be an emotional experience. If you have had a miscarriage, talk with your health care provider about any questions you may have about miscarrying, the grieving process, and your future pregnancy plans. What are the causes? This condition may be caused by:  Problems with the genes or chromosomes that make it impossible for the baby to develop normally. These problems are most often the result of random errors that occur early in development, and are not passed from parent to child (not inherited).  Infection of the cervix or uterus.  Conditions that affect hormone balance in the body.  Problems with the cervix, such as the cervix opening and thinning before pregnancy is at term (cervical insufficiency).  Problems with the uterus, such as a uterus with an abnormal shape, fibroids in the uterus, or problems that were present from birth (congenital abnormalities).  Certain medical conditions.  Smoking, drinking alcohol, or using drugs.  Injury (trauma). Many times, the cause of a miscarriage is not known. What are the signs or symptoms? Symptoms of this condition include:  Vaginal bleeding or spotting, with or without cramps or pain.  Pain or cramping in the abdomen or lower back.  Passing fluid, tissue, or blood clots from the vagina. How is this diagnosed? This condition may be diagnosed based on:  A physical exam.  Ultrasound.  Blood tests.  Urine tests. How is this treated? An incomplete miscarriage may be treated with:  Dilation and curettage (D&C). This is a procedure in which the cervix is stretched open and the lining of  the uterus (endometrium) is scraped to remove any remaining tissue from the pregnancy.  Medicines, such as: ? Antibiotic medicine to treat infection. ? Medicine to help any remaining tissue pass out of your uterus. ? Medicine to reduce (contract) the size of the uterus. These medicines may be given if you have a lot of bleeding. If you have Rh negative blood and your baby was Rh positive, you will need a shot of medicine called Rh immunoglobulinto protect future babies from Rh blood problems. "Rh-negative" and "Rh-positive" refer to whether or not the blood has a specific protein found on the surface of red blood cells (Rh factor). Follow these instructions at home: Medicines   Take over-the-counter and prescription medicines only as told by your health care provider.  If you were prescribed antibiotic medicine, take your antibiotic as told by your health care provider. Do not stop taking the antibiotic even if you start to feel better.  Do not take NSAIDs, such as aspirin and ibuprofen, unless approved by your doctor. These medicines can cause bleeding. Activity  Rest as directed. Ask your health care provider what activities are safe for you.  Have someone help with home and family responsibilities during this time. General instructions  Keep track of the number of sanitary pads you use each day and how soaked (saturated) they are. Write down this information.  Monitor the amount of tissue or blood clots that you pass from your vagina. Save any large amounts of tissue for your health care provider to examine.  Do not use tampons, douche, or have sex until your health care provider   approves.  To help you and your partner with the process of grieving, talk with your health care provider or seek counseling to help cope with the pregnancy loss.  When you are ready, meet with your health care provider to discuss important steps you should take for your health, as well as steps to take in  order to have a healthy pregnancy in the future.  Keep all follow-up visits as told by your health care provider. This is important. Where to find more information  The American Congress of Obstetricians and Gynecologists: www.acog.org  U.[place]. Department of Health and CytogeneticistHuman Services Office of Womens Health: http://hoffman.com/www.womenshealth.gov Contact a health care provider if:  You have a fever or chills.  You have a foul smelling vaginal discharge. Get help right away if:  You have severe cramps or pain in your back or abdomen.  You pass walnut-sized (or larger) blood clots or tissue from your vagina.  You have heavy bleeding, soaking more than 1 regular sanitary pad in an hour.  You become lightheaded or weak.  You pass out.  You have feelings of sadness that take over your thoughts, or you have thoughts of hurting yourself. Summary  In an incomplete miscarriage, parts of the fetus or placenta (afterbirth) remain in the body.  There are multiple treatment options for an incomplete miscarriage, talk to your health care provider about the best option for you.  Follow your health care provider's instructions for follow-up care.  To help you and your partner with the process of grieving, talk with your health care provider or seek counseling to help cope with the pregnancy loss. This information is not intended to replace advice given to you by your health care provider. Make sure you discuss any questions you have with your health care provider. Document Released: 02/09/2005 Document Revised: 03/18/2016 Document Reviewed: 03/18/2016 Elsevier Interactive Patient Education  2019 Elsevier Inc. Misoprostol tablets What is this medicine? MISOPROSTOL (mye soe PROST ole) helps to prevent stomach ulcers in patients who take medicines like ibuprofen and aspirin and who are at high risk of complications from ulcers. This medicine may be used for other purposes; ask your health care provider or  pharmacist if you have questions. COMMON BRAND NAME(S): Cytotec What should I tell my health care provider before I take this medicine? They need to know if you have any of these conditions: -Crohn's disease -heart disease -kidney disease -ulcerative colitis -an unusual or allergic reaction to misoprostol, prostaglandins, other medicines, foods, dyes, or preservatives -pregnant or trying to get pregnant -breast-feeding How should I use this medicine? Take this medicine by mouth with a full glass of water. Follow the directions on the prescription label. Take this medicine with food.Take your medicine at regular intervals. Do not take your medicine more often than directed. Talk to your pediatrician regarding the use of this medicine in children. Special care may be needed. Overdosage: If you think you have taken too much of this medicine contact a poison control center or emergency room at once. NOTE: This medicine is only for you. Do not share this medicine with others. What if I miss a dose? If you miss a dose, take it as soon as you can. If it is almost time for your next dose, take only that dose. Do not take double or extra doses. What may interact with this medicine? -antacids This list may not describe all possible interactions. Give your health care provider a list of all the medicines, herbs, non-prescription  drugs, or dietary supplements you use. Also tell them if you smoke, drink alcohol, or use illegal drugs. Some items may interact with your medicine. What should I watch for while using this medicine? Do not smoke cigarettes or drink alcohol. These increase irritation to your stomach and can make it more susceptible to damage from medicine like ibuprofen and aspirin. If you are female, do not use this medicine if you are pregnant. Do not get pregnant while taking this medicine and for at least one month (one full menstrual cycle) after stopping this medicine. If you can become  pregnant, use a reliable form of birth control while taking this medicine. Talk to your doctor about birth control options. If you do become pregnant, think you are pregnant, or want to become pregnant, immediately call your doctor for advice. What side effects may I notice from receiving this medicine? Side effects that you should report to your doctor or health care professional as soon as possible: -allergic reactions like skin rash, itching or hives, swelling of the face, lips, or tongue -chest pain -fainting spells -severe diarrhea -sudden shortness of breath -unusual vaginal bleeding, pelvic pain, or cramping Side effects that usually do not require medical attention (report to your doctor or health care professional if they continue or are bothersome): -dizziness -headache -menstrual irregularity, spotting, or cramps -mild diarrhea -nausea -stomach upset or cramps This list may not describe all possible side effects. Call your doctor for medical advice about side effects. You may report side effects to FDA at 1-800-FDA-1088. Where should I keep my medicine? Keep out of the reach of children. Store at room temperature below 25 degrees C (77 degrees F). Keep in a dry place. Protect from moisture. Throw away any unused medicine after the expiration date. NOTE: This sheet is a summary. It may not cover all possible information. If you have questions about this medicine, talk to your doctor, pharmacist, or health care provider.  2019 Elsevier/Gold Standard (2008-01-24 10:59:53)

## 2018-07-17 NOTE — MAU Provider Note (Signed)
History   Chief Complaint:  Labs Only   Emily Cantu is  23 y.o. G1P1001 No LMP recorded.. Patient is here for follow up of quantitative HCG and ongoing surveillance of VB after TAB 6 wks ago.     Since her last visit, the patient is without new complaint.   The patient reports bleeding as the same as it was on 07/15/18.  She states she is "ready for this bleeding to stop." She is requesting medication to stop her from bleeding.  General ROS:  negative  Her previous Quantitative HCG values are:  24 on 5/22   Physical Exam   Blood pressure 111/65, pulse (!) 56, temperature 98.1 F (36.7 C), temperature source Oral, resp. rate 16.  Focused Gynecological Exam: exam declined by the patient  Labs: Results for orders placed or performed during the hospital encounter of 07/17/18 (from the past 24 hour(s))  hCG, quantitative, pregnancy   Collection Time: 07/17/18 11:20 AM  Result Value Ref Range   hCG, Beta Chain, Quant, S 16 (H) <5 mIU/mL    Ultrasound Studies:   US Ob Comp < 14 Wks  Result Date: 07/15/2018 CLINICAL DATA:  Abortion 05/28/2018 with continued vaginal bleeding. Positive quantitative beta HCG. EXAM: OBSTETRIC <14 WK ULTRASOUND TECHNIQUE: Transvaginal ultrasound was performed for complete evaluation of the gestation as well as the maternal uterus, adnexal regions, and pelvic cul-de-sac. COMPARISON:  None. FINDINGS: Heterogeneous material thickens the endometrium to 1.5 mm. There is avid color Doppler flow within this material suggesting trophoblastic flow. The left ovary shows a hypoechoic 3.3 mm structure with internal lace-like appearance, typical of a hemorrhagic cyst. The right ovary is negative. IMPRESSION: 1. Heterogeneous material in the endometrial cavity with avid vascular flow, consistent with retained products of conception in this setting. 2. 3.3 cm left ovarian lesion with features of hemorrhagic cyst. Electronically Signed   By: Marnee Spring M.D.   On: 07/15/2018  05:27   *Consult with Dr. Despina Hidden @ [phone] - notified of patient's complaints, assessments, lab results, recommended tx plan Rx Cytotec 200 mg TID x 3 days, F/U in office to get started on [facility]   Assessment: Retained products of conception following abortion - Plan: Discharge patient  Plan: - Discharge home - Rx Cytotec 200 mg po TID x 3 days - Advised to take Ibuprofen 600 mg every 6 hrs prn pain - F/U with [facility] in 2-3 wks to see provider and get started on [facility]  msg sent to Admin pool to get pt established   Raelyn Mora, MSN, CNM 07/17/2018, 2:16 PM

## 2018-07-17 NOTE — MAU Note (Signed)
Pt reports to mau for repeat labs.  Pt denies any pain at this time and vag bleeding that is consistent with her last visit 2 days ago.

## 2018-07-21 ENCOUNTER — Other Ambulatory Visit: Payer: Self-pay

## 2018-07-21 ENCOUNTER — Encounter (HOSPITAL_COMMUNITY): Payer: Self-pay | Admitting: Emergency Medicine

## 2018-07-21 ENCOUNTER — Emergency Department (HOSPITAL_COMMUNITY)
Admission: EM | Admit: 2018-07-21 | Discharge: 2018-07-22 | Disposition: A | Discharge status: Home / Self Care | Payer: Medicaid Other | Attending: Emergency Medicine | Admitting: Emergency Medicine

## 2018-07-21 DIAGNOSIS — R102 Pelvic and perineal pain: Secondary | ICD-10-CM | POA: Diagnosis not present

## 2018-07-21 DIAGNOSIS — N938 Other specified abnormal uterine and vaginal bleeding: Secondary | ICD-10-CM | POA: Diagnosis present

## 2018-07-21 DIAGNOSIS — N939 Abnormal uterine and vaginal bleeding, unspecified: Secondary | ICD-10-CM

## 2018-07-21 NOTE — ED Triage Notes (Signed)
Patient had abortion on April 4th at 98w4days gestation, reports continued vaginal bleeding since that has persistently gotten heavier. Was seen here on 5/22 for the same but reports that bleeding has gotten heavier. Had u/s on 5/22 and was sent to [facility] hospital for follow up, and has appt on 6/4 but states that she feels the bleeding is too heavy to wait that long. She also reports she has taken some medication that she was given to help slow the bleeding which has not helped. resp e/u, nad.

## 2018-07-22 LAB — I-STAT BETA HCG BLOOD, ED (MC, WL, AP ONLY): I-stat hCG, quantitative: <5 m[IU]/mL (ref <5)

## 2018-07-22 NOTE — ED Notes (Signed)
ED Provider at bedside. 

## 2018-07-22 NOTE — ED Provider Notes (Signed)
[facility] Provider Note   CSN: [id number] Arrival date & time: 07/21/18  2329    History   Chief Complaint Chief Complaint  Patient presents with  . Vaginal Bleeding    HPI Emily Cantu is a 23 y.o. female.     Patient returns to the emergency department with complaint of increased vaginal bleeding that started yesterday (07/21/18). She had an elective abortion on 05/27/18 at 9 weeks and presented to the ER on 07/15/18 with a diagnosis established of retained POC. [facility] for follow up lab studies, which showed a decreasing Hcg. She was given Cytotec at that time and completed her 3-day prescription on 07/20/18. She reports continuous and increased vaginal bleeding today causing concern. No fever, vomiting, urinary symptoms.   The history is provided by the patient. No language interpreter was used.  Vaginal Bleeding  Associated symptoms: no abdominal pain, no dysuria, no fever and no vaginal discharge     Past Medical History:  Diagnosis Date  . Abortion   . Depression     Patient Active Problem List   Diagnosis Date Noted  . Retained products of conception following abortion 07/17/2018  . Normal labor 12/24/2016    Past Surgical History:  Procedure Laterality Date  . NO PAST SURGERIES       OB History    Gravida  1   Para  1   Term  1   Preterm      AB      Living  1     SAB      TAB      Ectopic      Multiple  0   Live Births  1            Home Medications    Prior to Admission medications   Medication Sig Start Date End Date Taking? Authorizing Provider  HYDROcodone-acetaminophen (NORCO/VICODIN) 5-325 MG tablet Take 1 tablet by mouth every 6 (six) hours as needed for severe pain. 07/15/18  Yes Zadie RhineWickline, Donald, MD  ibuprofen (ADVIL) 600 MG tablet Take 1 tablet (600 mg total) by mouth every 6 (six) hours as needed. Patient not taking: Reported on 07/22/2018 07/17/18    Raelyn Moraawson, Rolitta, CNM  misoprostol (CYTOTEC) 200 MCG tablet Take 1 tablet (200 mcg total) by mouth 3 (three) times daily. Patient not taking: Reported on 07/22/2018 07/17/18   Raelyn Moraawson, Rolitta, CNM    Family History Family History  Problem Relation Age of Onset  . Hypertension Mother   . Cancer Father   . Hypertension Maternal Grandmother     Social History Social History   Tobacco Use  . Smoking status: Never Smoker  . Smokeless tobacco: Never Used  Substance Use Topics  . Alcohol use: No  . Drug use: No     Allergies   Patient has no known allergies.   Review of Systems Review of Systems  Constitutional: Negative for chills and fever.  Respiratory: Negative.  Negative for shortness of breath.   Cardiovascular: Negative.   Gastrointestinal: Negative.  Negative for abdominal pain and vomiting.  Genitourinary: Positive for pelvic pain and vaginal bleeding. Negative for dysuria and vaginal discharge.  Musculoskeletal: Negative.   Skin: Negative.   Neurological: Negative.  Negative for syncope and light-headedness.     Physical Exam Updated Vital Signs BP 110/73   Pulse (!) 52   Temp 98.3 F (36.8 C) (Oral)   Resp 17  Ht 5\' 5"  (1.651 m)   Wt 52.6 kg   SpO2 99%   BMI 19.30 kg/m   Physical Exam Constitutional:      Appearance: She is well-developed.  Neck:     Musculoskeletal: Normal range of motion.  Pulmonary:     Effort: Pulmonary effort is normal.  Abdominal:     General: There is no distension.     Palpations: Abdomen is soft.     Tenderness: There is no abdominal tenderness.  Genitourinary:    Comments: Cervical bleeding is moderate. No clotting or POC visualized. Cervix nonfriable. No discharge.  Skin:    General: Skin is warm and dry.  Neurological:     Mental Status: She is alert and oriented to person, place, and time.      ED Treatments / Results  Labs (all labs ordered are listed, but only abnormal results are displayed) Labs  Reviewed  I-STAT BETA HCG BLOOD, ED ([facility], [facility], [facility] ONLY)    EKG None  Radiology No results found.  Procedures Procedures (including critical care time)  Medications Ordered in ED Medications - No data to display   Initial Impression / Assessment and Plan / ED Course  I have reviewed the triage vital signs and the nursing notes.  Pertinent labs & imaging results that were available during my care of the patient were reviewed by me and considered in my medical decision making (see chart for details).        Patient to ED with concern for increased vaginal bleeding. Elective abortion in April with diagnosis of retained products on 5/22. Cytotec provided on follow up at [facility] that she finished 5/27.   HCG today is <5. No evidence of infection - no discharge, fever. Increased bleeding would be expected after Cytotec regimen.   She is felt appropriate for discharge home. Recommend follow up as scheduled in [facility] for 07/28/18.   Final Clinical Impressions(s) / ED Diagnoses   Final diagnoses:  None   1. Vaginal bleeding  ED Discharge Orders    None       Elpidio Anis, PA-C 07/22/18 [id number]    Ward, Layla Maw, DO 07/22/18 [phone]

## 2018-07-22 NOTE — Discharge Instructions (Addendum)
Follow up as planned in [facility] in June. Go to [facility] if you have any worsening vaginal bleeding or new gynecological symptoms of concern.

## 2018-07-28 ENCOUNTER — Other Ambulatory Visit: Payer: Self-pay

## 2018-07-28 ENCOUNTER — Ambulatory Visit (INDEPENDENT_AMBULATORY_CARE_PROVIDER_SITE_OTHER): Payer: Medicaid Other | Admitting: Family Medicine

## 2018-07-28 ENCOUNTER — Encounter: Payer: Self-pay | Admitting: General Practice

## 2018-07-28 ENCOUNTER — Encounter: Payer: Self-pay | Admitting: Family Medicine

## 2018-07-28 VITALS — BP 117/75 | HR 56 | Wt 115.0 lb

## 2018-07-28 DIAGNOSIS — N939 Abnormal uterine and vaginal bleeding, unspecified: Secondary | ICD-10-CM

## 2018-07-28 MED ORDER — NORGESTIMATE-ETH ESTRADIOL 0.25-35 MG-MCG PO TABS: 1.0000 | ORAL_TABLET | Freq: Every day | ORAL | 11 refills | Status: DC

## 2018-07-28 MED ORDER — FERROUS SULFATE 325 (65 FE) MG PO TABS: 325.0000 mg | ORAL_TABLET | Freq: Two times a day (BID) | ORAL | 1 refills | Status: DC

## 2018-07-28 MED ORDER — AZITHROMYCIN 250 MG PO TABS: 250.0000 mg | ORAL_TABLET | Freq: Every day | ORAL | 0 refills | Status: DC

## 2018-07-28 NOTE — Progress Notes (Signed)
   Subjective:    Patient ID: Emily Cantu, female    DOB: 08-Mar-1995, 23 y.o.   MRN: [id number]  HPI Patient seen for f/u of TAB and bleeding.  She had a therapeutic abortion on April 4.  She had significant bleeding following the urine.  She went to the MAU on 5/24 for the bleeding and was found to have a positive quant 16 with an ultrasound that showed retained products.  She received Cytotec and subsequently had a hCG a few days later that was normal.  Patient continues to have bleeding with a lot of clots and has some cramping and pain.  Denies fevers, chills, nausea, vomiting.   Review of Systems     Objective:   Physical Exam Cardiovascular:     Rate and Rhythm: Normal rate.     Pulses: Normal pulses.  Pulmonary:     Effort: Pulmonary effort is normal.  Abdominal:     General: Abdomen is flat.     Palpations: Abdomen is soft.     Comments: Mild tenderness to the abdomen and the pelvic structures.  Neurological:     Mental Status: She is alert.       Assessment & Plan:  1. Abnormal uterine bleeding (AUB) Will give OCP pack and azithromycin for inflammation and possible infection. Patient is probably anemic - will give prescrption for iron as well. F/u in 3-4 weeks. Patient to call in 2 weeks if not improving.

## 2018-07-28 NOTE — Progress Notes (Signed)
Patient had TAB on April 4th, 2020- at [facility] on [address] [place].Patient is still having bleeding with blood clots. Patient states she is having some pain today as well. Patient is complaining of pain in mid abdomen. Patient had dose of cytotec on 07-17-2018 for three days. Armandina Stammer RN

## 2018-08-25 ENCOUNTER — Encounter: Payer: Self-pay | Admitting: Family Medicine

## 2018-08-25 ENCOUNTER — Other Ambulatory Visit: Payer: Self-pay

## 2018-08-25 ENCOUNTER — Ambulatory Visit (INDEPENDENT_AMBULATORY_CARE_PROVIDER_SITE_OTHER): Payer: Medicaid Other | Admitting: Family Medicine

## 2018-08-25 VITALS — BP 127/62 | HR 50 | Ht 64.0 in | Wt 117.0 lb

## 2018-08-25 DIAGNOSIS — N939 Abnormal uterine and vaginal bleeding, unspecified: Secondary | ICD-10-CM

## 2018-08-25 NOTE — Progress Notes (Signed)
   Subjective:    Patient ID: Emily Cantu, female    DOB: 1996-01-13, 23 y.o.   MRN: [id number]  HPI Patient seen for f/u of AUB after TAB. She received azithromycin for concerns of endometritis and OCPs to control the bleeding. The pain has resolved and she has had some intermittent bleeding and spotting.    Review of Systems     Objective:   Physical Exam Constitutional:      Appearance: Normal appearance.  Cardiovascular:     Pulses: Normal pulses.  Pulmonary:     Effort: Pulmonary effort is normal.  Abdominal:     General: Abdomen is flat. There is no distension.     Palpations: Abdomen is soft. There is no mass.     Tenderness: There is no abdominal tenderness. There is no guarding or rebound.     Hernia: No hernia is present.  Neurological:     Mental Status: She is alert.  Psychiatric:        Mood and Affect: Mood normal.        Behavior: Behavior normal.        Thought Content: Thought content normal.        Judgment: Judgment normal.         Assessment & Plan:  1. Abnormal uterine bleeding (AUB) Improved. Not abnormal for her to have some breakthrough bleeding. Continue with OCPs. F/u in 3 months.

## 2018-08-25 NOTE — Progress Notes (Signed)
Patient states she has had some spotting on and off still. Kathrene Alu, RN

## 2018-09-24 ENCOUNTER — Emergency Department (HOSPITAL_BASED_OUTPATIENT_CLINIC_OR_DEPARTMENT_OTHER)
Admission: EM | Admit: 2018-09-24 | Discharge: 2018-09-24 | Disposition: A | Discharge status: Home / Self Care | Payer: Medicaid Other | Attending: Emergency Medicine | Admitting: Emergency Medicine

## 2018-09-24 ENCOUNTER — Other Ambulatory Visit: Payer: Self-pay

## 2018-09-24 ENCOUNTER — Encounter (HOSPITAL_BASED_OUTPATIENT_CLINIC_OR_DEPARTMENT_OTHER): Payer: Self-pay | Admitting: Emergency Medicine

## 2018-09-24 DIAGNOSIS — N939 Abnormal uterine and vaginal bleeding, unspecified: Secondary | ICD-10-CM | POA: Insufficient documentation

## 2018-09-24 DIAGNOSIS — Z79899 Other long term (current) drug therapy: Secondary | ICD-10-CM | POA: Diagnosis not present

## 2018-09-24 LAB — URINALYSIS, ROUTINE W REFLEX MICROSCOPIC
Bilirubin Urine: NEGATIVE
Glucose, UA: NEGATIVE mg/dL
Ketones, ur: NEGATIVE mg/dL
Leukocytes,Ua: NEGATIVE
Nitrite: NEGATIVE
Protein, ur: NEGATIVE mg/dL
Specific Gravity, Urine: >1.03 — ABNORMAL HIGH (ref 1.005–1.030)
pH: 5.5 (ref 5.0–8.0)

## 2018-09-24 LAB — URINALYSIS, MICROSCOPIC (REFLEX): RBC / HPF: >50 RBC/hpf (ref 0–5)

## 2018-09-24 LAB — WET PREP, GENITAL
Sperm: NONE SEEN
Trich, Wet Prep: NONE SEEN
Yeast Wet Prep HPF POC: NONE SEEN

## 2018-09-24 LAB — PREGNANCY, URINE: Preg Test, Ur: NEGATIVE

## 2018-09-24 MED ORDER — IBUPROFEN 400 MG PO TABS
600.0000 mg | ORAL_TABLET | Freq: Once | ORAL | Status: AC
  Administered 2018-09-24: 600 mg via ORAL
  Filled 2018-09-24: qty 1

## 2018-09-24 NOTE — Discharge Instructions (Signed)
Take Ibuprofen for pelvic cramping and bleeding Return if worsening

## 2018-09-24 NOTE — ED Triage Notes (Signed)
Positive pregnancy test at home. Reports abd cramping and vaginal bleeding

## 2018-09-24 NOTE — ED Provider Notes (Signed)
[facility] EMERGENCY DEPARTMENT Provider Note   CSN: [id number] Arrival date & time: 09/24/18  1657     History   Chief Complaint Chief Complaint  Patient presents with  . Vaginal Bleeding    HPI Emily Cantu is a 23 y.o. G2P1 female who presents with vaginal bleeding and pelvic cramping.  She states that her last normal period was on June 26.  She missed her period in July.  She took a home pregnancy test this past Tuesday and it was positive.  Today she started having vaginal bleeding like a regular.  With associated pelvic cramping.  She denies fever, vomiting, vaginal discharge, urinary symptoms.  She states that normally her periods are very regular and therefore she is unsure of why she is having the symptoms.     HPI  Past Medical History:  Diagnosis Date  . Abortion   . Depression     Patient Active Problem List   Diagnosis Date Noted  . Retained products of conception following abortion 07/17/2018  . Normal labor 12/24/2016    Past Surgical History:  Procedure Laterality Date  . NO PAST SURGERIES       OB History    Gravida  2   Para  1   Term  1   Preterm      AB  1   Living  1     SAB      TAB  1   Ectopic      Multiple  0   Live Births  1            Home Medications    Prior to Admission medications   Medication Sig Start Date End Date Taking? Authorizing Provider  azithromycin (ZITHROMAX) 250 MG tablet Take 1 tablet (250 mg total) by mouth daily. 07/28/18   Levie HeritageStinson, Jacob J, DO  ferrous sulfate (FERROUSUL) 325 (65 FE) MG tablet Take 1 tablet (325 mg total) by mouth 2 (two) times daily. 07/28/18   Levie HeritageStinson, Jacob J, DO  HYDROcodone-acetaminophen (NORCO/VICODIN) 5-325 MG tablet Take 1 tablet by mouth every 6 (six) hours as needed for severe pain. 07/15/18   Zadie RhineWickline, Donald, MD  ibuprofen (ADVIL) 600 MG tablet Take 1 tablet (600 mg total) by mouth every 6 (six) hours as needed. Patient not taking: Reported on 07/22/2018  07/17/18   Raelyn Moraawson, Rolitta, CNM  misoprostol (CYTOTEC) 200 MCG tablet Take 1 tablet (200 mcg total) by mouth 3 (three) times daily. Patient not taking: Reported on 07/22/2018 07/17/18   Raelyn Moraawson, Rolitta, CNM  norgestimate-ethinyl estradiol (ORTHO-CYCLEN) 0.25-35 MG-MCG tablet Take 1 tablet by mouth daily. Patient not taking: Reported on 08/25/2018 07/28/18   Levie HeritageStinson, Jacob J, DO    Family History Family History  Problem Relation Age of Onset  . Hypertension Mother   . Cancer Father   . Hypertension Maternal Grandmother     Social History Social History   Tobacco Use  . Smoking status: Never Smoker  . Smokeless tobacco: Never Used  Substance Use Topics  . Alcohol use: No  . Drug use: No     Allergies   Patient has no known allergies.   Review of Systems Review of Systems  Constitutional: Negative for fever.  Gastrointestinal: Negative for abdominal pain, nausea and vomiting.  Genitourinary: Positive for pelvic pain and vaginal bleeding.  All other systems reviewed and are negative.    Physical Exam Updated Vital Signs BP 114/69 (BP Location: Right Arm)   Pulse 73  Temp 98.7 F (37.1 C) (Oral)   Resp 18   Ht 5\' 3"  (1.6 m)   Wt 54.2 kg   LMP 08/20/2018   SpO2 99%   BMI 21.15 kg/m   Physical Exam Vitals signs and nursing note reviewed.  Constitutional:      General: She is not in acute distress.    Appearance: Normal appearance. She is well-developed. She is not ill-appearing.  HENT:     Head: Normocephalic and atraumatic.  Eyes:     General: No scleral icterus.       Right eye: No discharge.        Left eye: No discharge.     Conjunctiva/sclera: Conjunctivae normal.     Pupils: Pupils are equal, round, and reactive to light.  Neck:     Musculoskeletal: Normal range of motion.  Cardiovascular:     Rate and Rhythm: Normal rate and regular rhythm.  Pulmonary:     Effort: Pulmonary effort is normal. No respiratory distress.     Breath sounds: Normal breath  sounds.  Abdominal:     General: There is no distension.     Palpations: Abdomen is soft.     Tenderness: There is no abdominal tenderness.  Genitourinary:    Comments: Pelvic: No inguinal lymphadenopathy or inguinal hernia noted. Normal external genitalia. No pain with speculum insertion. Closed cervical os with normal appearance - no rash or lesions. Moderate bleeding in vaginal vault. On bimanual examination no adnexal tenderness or cervical motion tenderness. Chaperone present during exam.   Skin:    General: Skin is warm and dry.  Neurological:     Mental Status: She is alert and oriented to person, place, and time.  Psychiatric:        Behavior: Behavior normal.      ED Treatments / Results  Labs (all labs ordered are listed, but only abnormal results are displayed) Labs Reviewed  WET PREP, GENITAL - Abnormal; Notable for the following components:      Result Value   Clue Cells Wet Prep HPF POC PRESENT (*)    WBC, Wet Prep HPF POC FEW (*)    All other components within normal limits  URINALYSIS, ROUTINE W REFLEX MICROSCOPIC - Abnormal; Notable for the following components:   APPearance CLOUDY (*)    Specific Gravity, Urine >1.030 (*)    Hgb urine dipstick LARGE (*)    All other components within normal limits  URINALYSIS, MICROSCOPIC (REFLEX) - Abnormal; Notable for the following components:   Bacteria, UA FEW (*)    All other components within normal limits  PREGNANCY, URINE  GC/CHLAMYDIA PROBE AMP ([place]) NOT AT [facility]    EKG None  Radiology No results found.  Procedures Procedures (including critical care time)  Medications Ordered in ED Medications  ibuprofen (ADVIL) tablet 600 mg (600 mg Oral Given 09/24/18 1859)     Initial Impression / Assessment and Plan / ED Course  I have reviewed the triage vital signs and the nursing notes.  Pertinent labs & imaging results that were available during my care of the patient were reviewed by me and considered  in my medical decision making (see chart for details).  23 year old female with vaginal bleeding.  Her pregnancy test is negative here.  Her pelvic exam is remarkable for vaginal bleeding.  There is no cervical motion or adnexal tenderness.  She is given ibuprofen for pelvic cramping.  I think most likely her period is just a little bit late  this month and her home pregnancy test was probably a false positive as she took a home pregnancy test on the day that she missed her period. She was given reassurance.   Final Clinical Impressions(s) / ED Diagnoses   Final diagnoses:  Vaginal bleeding    ED Discharge Orders    None       Beryle QuantGekas, Marquice Uddin Marie, PA-C 09/24/18 2137    Pricilla LovelessGoldston, Scott, MD 09/25/18 2249

## 2018-09-27 LAB — GC/CHLAMYDIA PROBE AMP (~~LOC~~) NOT AT ARMC
Chlamydia: NEGATIVE
Neisseria Gonorrhea: NEGATIVE

## 2018-10-19 ENCOUNTER — Emergency Department (HOSPITAL_BASED_OUTPATIENT_CLINIC_OR_DEPARTMENT_OTHER): Payer: Medicaid Other

## 2018-10-19 ENCOUNTER — Encounter (HOSPITAL_BASED_OUTPATIENT_CLINIC_OR_DEPARTMENT_OTHER): Payer: Self-pay

## 2018-10-19 ENCOUNTER — Emergency Department (HOSPITAL_BASED_OUTPATIENT_CLINIC_OR_DEPARTMENT_OTHER)
Admission: EM | Admit: 2018-10-19 | Discharge: 2018-10-19 | Disposition: A | Discharge status: Home / Self Care | Payer: Medicaid Other | Attending: Emergency Medicine | Admitting: Emergency Medicine

## 2018-10-19 ENCOUNTER — Other Ambulatory Visit: Payer: Self-pay

## 2018-10-19 DIAGNOSIS — R51 Headache: Secondary | ICD-10-CM | POA: Insufficient documentation

## 2018-10-19 DIAGNOSIS — Z3201 Encounter for pregnancy test, result positive: Secondary | ICD-10-CM

## 2018-10-19 DIAGNOSIS — R519 Headache, unspecified: Secondary | ICD-10-CM

## 2018-10-19 DIAGNOSIS — Z3A01 Less than 8 weeks gestation of pregnancy: Secondary | ICD-10-CM | POA: Diagnosis not present

## 2018-10-19 DIAGNOSIS — O9989 Other specified diseases and conditions complicating pregnancy, childbirth and the puerperium: Secondary | ICD-10-CM | POA: Diagnosis present

## 2018-10-19 LAB — PREGNANCY, URINE: Preg Test, Ur: POSITIVE — AB

## 2018-10-19 MED ORDER — ACETAMINOPHEN 325 MG PO TABS
650.0000 mg | ORAL_TABLET | Freq: Once | ORAL | Status: AC
  Administered 2018-10-19: 650 mg via ORAL
  Filled 2018-10-19: qty 2

## 2018-10-19 NOTE — ED Notes (Signed)
Pt refusing CT at this time. EDP informed.

## 2018-10-19 NOTE — Discharge Instructions (Addendum)
You have been diagnosed today with positive pregnancy test and headache.  At this time there does not appear to be the presence of an emergent medical condition, however there is always the potential for conditions to change. Please read and follow the below instructions.  Please return to the Emergency Department immediately for any new or worsening symptoms. Please be sure to follow up with your Primary Care Provider within one week regarding your visit today; please call their office to schedule an appointment even if you are feeling better for a follow-up visit. Please be sure to call your OB/GYN's office tomorrow morning to inform them of your positive pregnancy test and need for follow-up.  If you are unable to follow-up with your OB/GYN you may follow-up with guidance clinic on your discharge paperwork for further work-up. You were offered CT scan today of the head for evaluation of your headache.  You have chosen not to have a CT evaluation of your head today.  I advised that you call the specialist at [facility] neurology to schedule a follow-up regarding your chronic headaches, call their office tomorrow to schedule a follow-up appointment.  Return to emergency room immediately for any new or worsening symptoms.  Get help right away if: Your headache gets very bad quickly. Your headache gets worse after a lot of physical activity. You keep throwing up. You have a stiff neck. You have trouble seeing. You have trouble speaking. You have pain in the eye or ear. Your muscles are weak or you lose muscle control. You lose your balance or have trouble walking. You feel like you will pass out (faint) or you pass out. You are mixed up (confused). You have a seizure. You have a fever. You are leaking fluid from your vagina. You have spotting or bleeding from your vagina. You have very bad belly cramping or pain. You gain or lose weight rapidly. You throw up blood. It may look like coffee  grounds. You are around people who have [place] measles, fifth disease, or chickenpox. You have a very bad headache. You have shortness of breath. You have any kind of trauma, such as from a fall or a car accident. You have any new/concerning or worsening symptoms  Please read the additional information packets attached to your discharge summary.
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
## 2018-11-01 ENCOUNTER — Other Ambulatory Visit: Payer: Self-pay

## 2018-11-01 ENCOUNTER — Ambulatory Visit (INDEPENDENT_AMBULATORY_CARE_PROVIDER_SITE_OTHER): Payer: Medicaid Other | Admitting: Advanced Practice Midwife

## 2018-11-01 ENCOUNTER — Other Ambulatory Visit (HOSPITAL_COMMUNITY)
Admission: RE | Admit: 2018-11-01 | Discharge: 2018-11-01 | Disposition: A | Discharge status: Home / Self Care | Payer: Medicaid Other | Source: Ambulatory Visit | Attending: Advanced Practice Midwife | Admitting: Advanced Practice Midwife

## 2018-11-01 ENCOUNTER — Encounter: Payer: Self-pay | Admitting: Advanced Practice Midwife

## 2018-11-01 DIAGNOSIS — Z3480 Encounter for supervision of other normal pregnancy, unspecified trimester: Secondary | ICD-10-CM | POA: Insufficient documentation

## 2018-11-01 DIAGNOSIS — O418X10 Other specified disorders of amniotic fluid and membranes, first trimester, not applicable or unspecified: Secondary | ICD-10-CM | POA: Insufficient documentation

## 2018-11-01 DIAGNOSIS — Z3A08 8 weeks gestation of pregnancy: Secondary | ICD-10-CM | POA: Diagnosis not present

## 2018-11-01 DIAGNOSIS — O468X1 Other antepartum hemorrhage, first trimester: Secondary | ICD-10-CM | POA: Diagnosis not present

## 2018-11-01 HISTORY — DX: Other specified disorders of amniotic fluid and membranes, first trimester, not applicable or unspecified: O41.8X10

## 2018-11-01 MED ORDER — AMBULATORY NON FORMULARY MEDICATION: 1.0000 | 0 refills | Status: DC

## 2018-11-01 NOTE — Patient Instructions (Signed)
Subchorionic Hematoma  A subchorionic hematoma is a gathering of blood between the outer wall of the embryo (chorion) and the inner wall of the womb (uterus). This condition can cause vaginal bleeding. If they cause little or no vaginal bleeding, early small hematomas usually shrink on their own and do not affect your baby or pregnancy. When bleeding starts later in pregnancy, or if the hematoma is larger or occurs in older pregnant women, the condition may be more serious. Larger hematomas may get bigger, which increases the chances of miscarriage. This condition also increases the risk of:  Premature separation of the placenta from the uterus.  Premature (preterm) labor.  Stillbirth. What are the causes? The exact cause of this condition is not known. It occurs when blood is trapped between the placenta and the uterine wall because the placenta has separated from the original site of implantation. What increases the risk? You are more likely to develop this condition if:  You were treated with fertility medicines.  You conceived through in vitro fertilization (IVF). What are the signs or symptoms? Symptoms of this condition include:  Vaginal spotting or bleeding.  Contractions of the uterus. These cause abdominal pain. Sometimes you may have no symptoms and the bleeding may only be seen when ultrasound images are taken (transvaginal ultrasound). How is this diagnosed? This condition is diagnosed based on a physical exam. This includes a pelvic exam. You may also have other tests, including:  Blood tests.  Urine tests.  Ultrasound of the abdomen. How is this treated? Treatment for this condition can vary. Treatment may include:  Watchful waiting. You will be monitored closely for any changes in bleeding. During this stage: ? The hematoma may be reabsorbed by the body. ? The hematoma may separate the fluid-filled space containing the embryo (gestational sac) from the wall of the  womb (endometrium).  Medicines.  Activity restriction. This may be needed until the bleeding stops. Follow these instructions at home:  Stay on bed rest if told to do so by your health care provider.  Do not lift anything that is heavier than 10 lbs. (4.5 kg) or as told by your health care provider.  Do not use any products that contain nicotine or tobacco, such as cigarettes and e-cigarettes. If you need help quitting, ask your health care provider.  Track and write down the number of pads you use each day and how soaked (saturated) they are.  Do not use tampons.  Keep all follow-up visits as told by your health care provider. This is important. Your health care provider may ask you to have follow-up blood tests or ultrasound tests or both. Contact a health care provider if:  You have any vaginal bleeding.  You have a fever. Get help right away if:  You have severe cramps in your stomach, back, abdomen, or pelvis.  You pass large clots or tissue. Save any tissue for your health care provider to look at.  You have more vaginal bleeding, and you faint or become lightheaded or weak. Summary  A subchorionic hematoma is a gathering of blood between the outer wall of the placenta and the uterus.  This condition can cause vaginal bleeding.  Sometimes you may have no symptoms and the bleeding may only be seen when ultrasound images are taken.  Treatment may include watchful waiting, medicines, or activity restriction. This information is not intended to replace advice given to you by your health care provider. Make sure you discuss any questions you   have with your health care provider. Document Released: 05/27/2006 Document Revised: 01/22/2017 Document Reviewed: 04/07/2016 Elsevier Patient Education  2020 [employer] First Trimester of Pregnancy  The first trimester of pregnancy is from week 1 until the end of week 13 (months 1 through 3). During this time, your baby will  begin to develop inside you. At 6-8 weeks, the eyes and face are formed, and the heartbeat can be seen on ultrasound. At the end of 12 weeks, all the baby's organs are formed. Prenatal care is all the medical care you receive before the birth of your baby. Make sure you get good prenatal care and follow all of your doctor's instructions. Follow these instructions at home: Medicines  Take over-the-counter and prescription medicines only as told by your doctor. Some medicines are safe and some medicines are not safe during pregnancy.  Take a prenatal vitamin that contains at least 600 micrograms (mcg) of folic acid.  If you have trouble pooping (constipation), take medicine that will make your stool soft (stool softener) if your doctor approves. Eating and drinking   Eat regular, healthy meals.  Your doctor will tell you the amount of weight gain that is right for you.  Avoid raw meat and uncooked cheese.  If you feel sick to your stomach (nauseous) or throw up (vomit): ? Eat 4 or 5 small meals a day instead of 3 large meals. ? Try eating a few soda crackers. ? Drink liquids between meals instead of during meals.  To prevent constipation: ? Eat foods that are high in fiber, like fresh fruits and vegetables, whole grains, and beans. ? Drink enough fluids to keep your pee (urine) clear or pale yellow. Activity  Exercise only as told by your doctor. Stop exercising if you have cramps or pain in your lower belly (abdomen) or low back.  Do not exercise if it is too hot, too humid, or if you are in a place of great height (high altitude).  Try to avoid standing for long periods of time. Move your legs often if you must stand in one place for a long time.  Avoid heavy lifting.  Wear low-heeled shoes. Sit and stand up straight.  You can have sex unless your doctor tells you not to. Relieving pain and discomfort  Wear a good support bra if your breasts are sore.  Take warm water baths  (sitz baths) to soothe pain or discomfort caused by hemorrhoids. Use hemorrhoid cream if your doctor says it is okay.  Rest with your legs raised if you have leg cramps or low back pain.  If you have puffy, bulging veins (varicose veins) in your legs: ? Wear support hose or compression stockings as told by your doctor. ? Raise (elevate) your feet for 15 minutes, 3-4 times a day. ? Limit salt in your food. Prenatal care  Schedule your prenatal visits by the twelfth week of pregnancy.  Write down your questions. Take them to your prenatal visits.  Keep all your prenatal visits as told by your doctor. This is important. Safety  Wear your seat belt at all times when driving.  Make a list of emergency phone numbers. The list should include numbers for family, friends, the hospital, and police and fire departments. General instructions  Ask your doctor for a referral to a local prenatal class. Begin classes no later than at the start of month 6 of your pregnancy.  Ask for help if you need counseling or if you need help  with nutrition. Your doctor can give you advice or tell you where to go for help.  Do not use hot tubs, steam rooms, or saunas.  Do not douche or use tampons or scented sanitary pads.  Do not cross your legs for long periods of time.  Avoid all herbs and alcohol. Avoid drugs that are not approved by your doctor.  Do not use any tobacco products, including cigarettes, chewing tobacco, and electronic cigarettes. If you need help quitting, ask your doctor. You may get counseling or other support to help you quit.  Avoid cat litter boxes and soil used by cats. These carry germs that can cause birth defects in the baby and can cause a loss of your baby (miscarriage) or stillbirth.  Visit your dentist. At home, brush your teeth with a soft toothbrush. Be gentle when you floss. Contact a doctor if:  You are dizzy.  You have mild cramps or pressure in your lower belly.   You have a nagging pain in your belly area.  You continue to feel sick to your stomach, you throw up, or you have watery poop (diarrhea).  You have a bad smelling fluid coming from your vagina.  You have pain when you pee (urinate).  You have increased puffiness (swelling) in your face, hands, legs, or ankles. Get help right away if:  You have a fever.  You are leaking fluid from your vagina.  You have spotting or bleeding from your vagina.  You have very bad belly cramping or pain.  You gain or lose weight rapidly.  You throw up blood. It may look like coffee grounds.  You are around people who have [place] measles, fifth disease, or chickenpox.  You have a very bad headache.  You have shortness of breath.  You have any kind of trauma, such as from a fall or a car accident. Summary  The first trimester of pregnancy is from week 1 until the end of week 13 (months 1 through 3).  To take care of yourself and your unborn baby, you will need to eat healthy meals, take medicines only if your doctor tells you to do so, and do activities that are safe for you and your baby.  Keep all follow-up visits as told by your doctor. This is important as your doctor will have to ensure that your baby is healthy and growing well. This information is not intended to replace advice given to you by your health care provider. Make sure you discuss any questions you have with your health care provider. Document Released: 07/29/2007 Document Revised: 06/02/2018 Document Reviewed: 02/18/2016 Elsevier Patient Education  2020 [employer].

## 2018-11-01 NOTE — Progress Notes (Signed)
Bedside U/S shows IUP with gestational sac measuring 5 w 4d which is consistent with her LMP.  There is a yolk sac noted but no fetal pole seen.  The is a posterior subchorionic hemorrhage noted.  Pt denies having any vaginal bleeding.

## 2018-11-01 NOTE — Progress Notes (Signed)
  Subjective:    Emily Cantu is being seen today for her first obstetrical visit.  This is not a planned pregnancy. She is at [redacted]w[redacted]d gestation. Her obstetrical history is significant for Recent EAB in April 2020 with retained products of conception.  Was given meds "but didn't take them".  Took an OTC supplement "and it fixed everything".   Relationship with FOB: significant other, living together. Patient does intend to breast feed. Pregnancy history fully reviewed.  Patient reports no complaints.  Review of Systems:   Review of Systems  Objective:     BP 105/69   Pulse 60   Wt 54.5 kg   LMP 09/24/2018 (Exact Date)   BMI 21.27 kg/m  Physical Exam  Exam    Assessment:    Pregnancy: G3P1011 Patient Active Problem List   Diagnosis Date Noted  . Supervision of other normal pregnancy, antepartum 11/01/2018  . Subchorionic hematoma in first trimester 11/01/2018  . Normal labor 12/24/2016       Plan:     Initial labs drawn. Prenatal vitamins. Problem list reviewed and updated. Panorama discussed: requested. Role of ultrasound in pregnancy discussed; fetal survey: requested. Amniocentesis discussed: not indicated. Follow up in 2 weeks for [place] 50% of 30 min visit spent on counseling and coordination of care.   Welcomed to practice States feels OK about this pregnancy.; States FOB did not want her to abort last time, and they are both happy about this pregnancy Discussed practice and routines Plan repeat US in 2 weeks to see if fetus is visible Discussed Subchorionic hemorrhage and possible bleeding     Hansel Feinstein 11/01/2018

## 2018-11-01 NOTE — Progress Notes (Signed)
Bedside U/S shows gestational sac of [redacted]w[redacted]d

## 2018-11-02 LAB — GC/CHLAMYDIA PROBE AMP (~~LOC~~) NOT AT ARMC
Chlamydia: NEGATIVE
Neisseria Gonorrhea: NEGATIVE

## 2018-11-03 LAB — HEMOGLOBINOPATHY EVALUATION
Ferritin: 32 ng/mL (ref 15–150)
Hgb A2 Quant: 2.5 % (ref 1.8–3.2)
Hgb A: 97.5 % (ref 96.4–98.8)
Hgb C: 0 %
Hgb F Quant: 0 % (ref 0.0–2.0)
Hgb S: 0 %
Hgb Solubility: NEGATIVE
Hgb Variant: 0 %

## 2018-11-03 LAB — OBSTETRIC PANEL, INCLUDING HIV
Antibody Screen: NEGATIVE
Basophils Absolute: 0 10*3/uL (ref 0.0–0.2)
Basos: 1 %
EOS (ABSOLUTE): 0.1 10*3/uL (ref 0.0–0.4)
Eos: 2 %
HIV Screen 4th Generation wRfx: NONREACTIVE
Hematocrit: 41.7 % (ref 34.0–46.6)
Hemoglobin: 14 g/dL (ref 11.1–15.9)
Hepatitis B Surface Ag: NEGATIVE
Immature Grans (Abs): 0 10*3/uL (ref 0.0–0.1)
Immature Granulocytes: 0 %
Lymphocytes Absolute: 1.9 10*3/uL (ref 0.7–3.1)
Lymphs: 57 %
MCH: 28.9 pg (ref 26.6–33.0)
MCHC: 33.6 g/dL (ref 31.5–35.7)
MCV: 86 fL (ref 79–97)
Monocytes Absolute: 0.3 10*3/uL (ref 0.1–0.9)
Monocytes: 10 %
Neutrophils Absolute: 1 10*3/uL — ABNORMAL LOW (ref 1.4–7.0)
Neutrophils: 30 %
Platelets: 284 10*3/uL (ref 150–450)
RBC: 4.85 x10E6/uL (ref 3.77–5.28)
RDW: 13 % (ref 11.7–15.4)
RPR Ser Ql: NONREACTIVE
Rh Factor: POSITIVE
Rubella Antibodies, IGG: 1.48 index (ref >0.99)
WBC: 3.3 10*3/uL — ABNORMAL LOW (ref 3.4–10.8)

## 2018-11-04 LAB — CULTURE, OB URINE

## 2018-11-04 LAB — URINE CULTURE, OB REFLEX

## 2018-11-14 LAB — SMN1 COPY NUMBER ANALYSIS (SMA CARRIER SCREENING)

## 2018-11-14 LAB — CYSTIC FIBROSIS GENE TEST

## 2018-11-15 ENCOUNTER — Ambulatory Visit (INDEPENDENT_AMBULATORY_CARE_PROVIDER_SITE_OTHER): Payer: Medicaid Other

## 2018-11-15 ENCOUNTER — Other Ambulatory Visit: Payer: Self-pay

## 2018-11-15 VITALS — BP 106/52 | HR 60 | Wt 123.0 lb

## 2018-11-15 DIAGNOSIS — O468X1 Other antepartum hemorrhage, first trimester: Secondary | ICD-10-CM

## 2018-11-15 DIAGNOSIS — Z3A08 8 weeks gestation of pregnancy: Secondary | ICD-10-CM | POA: Diagnosis not present

## 2018-11-15 DIAGNOSIS — Z3491 Encounter for supervision of normal pregnancy, unspecified, first trimester: Secondary | ICD-10-CM | POA: Diagnosis not present

## 2018-11-15 DIAGNOSIS — Z3480 Encounter for supervision of other normal pregnancy, unspecified trimester: Secondary | ICD-10-CM

## 2018-11-15 DIAGNOSIS — O418X10 Other specified disorders of amniotic fluid and membranes, first trimester, not applicable or unspecified: Secondary | ICD-10-CM

## 2018-11-15 NOTE — Progress Notes (Signed)
Patient here for ultrasound for viability. Patient reports no vaginal bleeding. Kathrene Alu RN  DATING AND VIABILITY SONOGRAM   [name] is a 24 y.o. year old G58P1011 with LMP Patient's last menstrual period was 09/24/2018 (exact date). which would correlate to  [redacted]w[redacted]d weeks gestation.  She has regular menstrual cycles.   She is here today for a repeat initial sonogram.    GESTATION: [place]      FETAL ACTIVITY:          Heart rate    135          The fetus is active.    Did note subchronic hemorrhage 1.68cm   GESTATIONAL AGE AND  BIOMETRICS:  Gestational criteria: Estimated Date of Delivery: 07/01/19 by early ultrasound now at [redacted]w[redacted]d  Previous Scans:1      CROWN RUMP LENGTH           1.12 cm        7-1 weeks                                                                               AVERAGE EGA(BY THIS SCAN): 7-1weeks  WORKING EDD( early ultrasound ):  07-01-2019     TECHNICIAN COMMENTS: Patient informed that the ultrasound is considered a limited obstetric ultrasound and is not intended to be a complete ultrasound exam. Patient also informed that the ultrasound is not being completed with the intent of assessing for fetal or placental anomalies or any pelvic abnormalities. Explained that the purpose of today's ultrasound is to assess for fetal heart rate. Patient acknowledges the purpose of the exam and the limitations of the study.    Kathrene Alu 11/15/2018 8:45 AM

## 2018-11-15 NOTE — Progress Notes (Signed)
Ultrasound reviewed Agree with assessment  Seabron Spates, CNM

## 2018-11-29 ENCOUNTER — Encounter: Payer: Self-pay | Admitting: Advanced Practice Midwife

## 2018-11-29 ENCOUNTER — Other Ambulatory Visit: Payer: Self-pay

## 2018-11-29 ENCOUNTER — Ambulatory Visit (INDEPENDENT_AMBULATORY_CARE_PROVIDER_SITE_OTHER): Payer: Medicaid Other | Admitting: Advanced Practice Midwife

## 2018-11-29 VITALS — BP 112/54 | HR 60 | Wt 121.0 lb

## 2018-11-29 DIAGNOSIS — Z3481 Encounter for supervision of other normal pregnancy, first trimester: Secondary | ICD-10-CM

## 2018-11-29 DIAGNOSIS — Z3480 Encounter for supervision of other normal pregnancy, unspecified trimester: Secondary | ICD-10-CM

## 2018-11-29 DIAGNOSIS — Z3A09 9 weeks gestation of pregnancy: Secondary | ICD-10-CM

## 2018-11-29 NOTE — Patient Instructions (Signed)
Genetic Testing During Pregnancy Genetic testing during pregnancy is also called prenatal genetic testing. This type of testing can determine if your baby is at risk of being born with a disorder caused by abnormal genes or chromosomes (genetic disorder). Chromosomes contain genes that control how your baby will develop in your womb. There are many different genetic disorders. Examples of genetic disorders that may be found through genetic testing include Down syndrome and cystic fibrosis. Gene changes (mutations) can be passed down through families. Genetic testing is offered to all women before or during pregnancy. You can choose whether to have genetic testing. Why is genetic testing done? Genetic testing is done during pregnancy to find out whether your child is at risk for a genetic disorder. Having genetic testing allows you to:  Discuss your test results and options with a genetic counselor.  Prepare for a baby that may be born with a genetic disorder. Learning about the disorder ahead of time helps you be better prepared to manage it. Your health care providers can also be prepared in case your baby requires special care before or after birth.  Consider whether you want to continue with the pregnancy. In some cases, genetic testing may be done to learn about the traits a child will inherit. Types of genetic tests There are two basic types of genetic testing. Screening tests indicate whether your developing baby (fetus) is at higher risk for a genetic disorder. Diagnostic tests check actual fetal cells to diagnose a genetic disorder. Screening tests     Screening tests will not harm your baby. They are recommended for all pregnant women. Types of screening tests include:  Carrier screening. This test involves checking genes from both parents by testing their blood or saliva. The test checks to find out if the parents carry a genetic mutation that may be passed to a baby. In most cases,  both parents must carry the mutation for a baby to be at risk.  First trimester screening. This test combines a blood test with sound wave imaging of your baby (fetal ultrasound). This screening test checks for a risk of Down syndrome or other defects caused by having extra chromosomes. It also checks for defects of the heart, abdomen, or skeleton.  Second trimester screening also combines a blood test with a fetal ultrasound exam. It checks for a risk of genetic defects of the face, brain, spine, heart, or limbs.  Combined or sequential screening. This type of testing combines the results of first and second trimester screening. This type of testing may be more accurate than first or second trimester screening alone.  Cell-free DNA testing. This is a blood test that detects cells released by the placenta that get into the mother's blood. It can be used to check for a risk of Down syndrome, other extra chromosome syndromes, and disorders caused by abnormal numbers of sex chromosomes. This test can be done any time after 10 weeks of pregnancy.  Diagnostic tests Diagnostic tests carry slight risks of problems, including bleeding, infection, and loss of the pregnancy. These tests are done only if your baby is at risk for a genetic disorder. You may meet with a genetic counselor to discuss the risks and benefits before having diagnostic tests. Examples of diagnostic tests include:  Chorionic villus sampling (CVS). This involves a procedure to remove and test a sample of cells taken from the placenta. The procedure may be done between 10 and 12 weeks of pregnancy.  Amniocentesis. This involves a  procedure to remove and test a sample of fluid (amniotic fluid) and cells from the sac that surrounds the developing baby. The procedure may be done between 15 and 20 weeks of pregnancy. What do the results mean? For a screening test:  If the results are negative, it often means that your child is not at higher  risk. There is still a slight chance your child could have a genetic disorder.  If the results are positive, it does not mean your child will have a genetic disorder. It may mean that your child has a higher-than-normal risk for a genetic disorder. In that case, you may want to talk with a genetic counselor about whether you should have diagnostic genetic tests. For a diagnostic test:  If the result is negative, it is unlikely that your child will have a genetic disorder.  If the test is positive for a genetic disorder, it is likely that your child will have the disorder. The test may not tell how severe the disorder will be. Talk with your health care provider about your options. Questions to ask your health care provider Before talking to your health care provider about genetic testing, find out if there is a history of genetic disorders in your family. It may also help to know your family's ethnic origins. Then ask your health care provider the following questions:  Is my baby at risk for a genetic disorder?  What are the benefits of having genetic screening?  What tests are best for me and my baby?  What are the risks of each test?  If I get a positive result on a screening test, what is the next step?  Should I meet with a genetic counselor before having a diagnostic test?  Should my partner or other members of my family be tested?  How much do the tests cost? Will my insurance cover the testing? Summary  Genetic testing is done during pregnancy to find out whether your child is at risk for a genetic disorder.  Genetic testing is offered to all women before or during pregnancy. You can choose whether to have genetic testing.  There are two basic types of genetic testing. Screening tests indicate whether your developing baby (fetus) is at higher risk for a genetic disorder. Diagnostic tests check actual fetal cells to diagnose a genetic disorder.  If a diagnostic genetic test is  positive, talk with your health care provider about your options. This information is not intended to replace advice given to you by your health care provider. Make sure you discuss any questions you have with your health care provider. Document Released: 04/26/2017 Document Revised: 06/02/2018 Document Reviewed: 04/26/2017 Elsevier Patient Education  2020 [employer]. First Trimester of Pregnancy  The first trimester of pregnancy is from week 1 until the end of week 13 (months 1 through 3). During this time, your baby will begin to develop inside you. At 6-8 weeks, the eyes and face are formed, and the heartbeat can be seen on ultrasound. At the end of 12 weeks, all the baby's organs are formed. Prenatal care is all the medical care you receive before the birth of your baby. Make sure you get good prenatal care and follow all of your doctor's instructions. Follow these instructions at home: Medicines  Take over-the-counter and prescription medicines only as told by your doctor. Some medicines are safe and some medicines are not safe during pregnancy.  Take a prenatal vitamin that contains at least 600 micrograms (mcg)  of folic acid.  If you have trouble pooping (constipation), take medicine that will make your stool soft (stool softener) if your doctor approves. Eating and drinking   Eat regular, healthy meals.  Your doctor will tell you the amount of weight gain that is right for you.  Avoid raw meat and uncooked cheese.  If you feel sick to your stomach (nauseous) or throw up (vomit): ? Eat 4 or 5 small meals a day instead of 3 large meals. ? Try eating a few soda crackers. ? Drink liquids between meals instead of during meals.  To prevent constipation: ? Eat foods that are high in fiber, like fresh fruits and vegetables, whole grains, and beans. ? Drink enough fluids to keep your pee (urine) clear or pale yellow. Activity  Exercise only as told by your doctor. Stop exercising  if you have cramps or pain in your lower belly (abdomen) or low back.  Do not exercise if it is too hot, too humid, or if you are in a place of great height (high altitude).  Try to avoid standing for long periods of time. Move your legs often if you must stand in one place for a long time.  Avoid heavy lifting.  Wear low-heeled shoes. Sit and stand up straight.  You can have sex unless your doctor tells you not to. Relieving pain and discomfort  Wear a good support bra if your breasts are sore.  Take warm water baths (sitz baths) to soothe pain or discomfort caused by hemorrhoids. Use hemorrhoid cream if your doctor says it is okay.  Rest with your legs raised if you have leg cramps or low back pain.  If you have puffy, bulging veins (varicose veins) in your legs: ? Wear support hose or compression stockings as told by your doctor. ? Raise (elevate) your feet for 15 minutes, 3-4 times a day. ? Limit salt in your food. Prenatal care  Schedule your prenatal visits by the twelfth week of pregnancy.  Write down your questions. Take them to your prenatal visits.  Keep all your prenatal visits as told by your doctor. This is important. Safety  Wear your seat belt at all times when driving.  Make a list of emergency phone numbers. The list should include numbers for family, friends, the hospital, and police and fire departments. General instructions  Ask your doctor for a referral to a local prenatal class. Begin classes no later than at the start of month 6 of your pregnancy.  Ask for help if you need counseling or if you need help with nutrition. Your doctor can give you advice or tell you where to go for help.  Do not use hot tubs, steam rooms, or saunas.  Do not douche or use tampons or scented sanitary pads.  Do not cross your legs for long periods of time.  Avoid all herbs and alcohol. Avoid drugs that are not approved by your doctor.  Do not use any tobacco products,  including cigarettes, chewing tobacco, and electronic cigarettes. If you need help quitting, ask your doctor. You may get counseling or other support to help you quit.  Avoid cat litter boxes and soil used by cats. These carry germs that can cause birth defects in the baby and can cause a loss of your baby (miscarriage) or stillbirth.  Visit your dentist. At home, brush your teeth with a soft toothbrush. Be gentle when you floss. Contact a doctor if:  You are dizzy.  You have  mild cramps or pressure in your lower belly.  You have a nagging pain in your belly area.  You continue to feel sick to your stomach, you throw up, or you have watery poop (diarrhea).  You have a bad smelling fluid coming from your vagina.  You have pain when you pee (urinate).  You have increased puffiness (swelling) in your face, hands, legs, or ankles. Get help right away if:  You have a fever.  You are leaking fluid from your vagina.  You have spotting or bleeding from your vagina.  You have very bad belly cramping or pain.  You gain or lose weight rapidly.  You throw up blood. It may look like coffee grounds.  You are around people who have [place] measles, fifth disease, or chickenpox.  You have a very bad headache.  You have shortness of breath.  You have any kind of trauma, such as from a fall or a car accident. Summary  The first trimester of pregnancy is from week 1 until the end of week 13 (months 1 through 3).  To take care of yourself and your unborn baby, you will need to eat healthy meals, take medicines only if your doctor tells you to do so, and do activities that are safe for you and your baby.  Keep all follow-up visits as told by your doctor. This is important as your doctor will have to ensure that your baby is healthy and growing well. This information is not intended to replace advice given to you by your health care provider. Make sure you discuss any questions you have with  your health care provider. Document Released: 07/29/2007 Document Revised: 06/02/2018 Document Reviewed: 02/18/2016 Elsevier Patient Education  2020 [employer].

## 2018-11-29 NOTE — Progress Notes (Signed)
Bedside ultrasound done to collect fetal heart tones. Kathrene Alu RN

## 2018-11-29 NOTE — Progress Notes (Signed)
   PRENATAL VISIT NOTE  Subjective:  Emily Cantu is a 23 y.o. G3P1011 at [redacted]w[redacted]d being seen today for ongoing prenatal care.  She is currently monitored for the following issues for this low-risk pregnancy and has Normal labor; Supervision of other normal pregnancy, antepartum; and Subchorionic hematoma in first trimester on their problem list.  Patient reports no complaints.  Contractions: Not present. Vag. Bleeding: None.  Movement: Absent. Denies leaking of fluid.   The following portions of the patient's history were reviewed and updated as appropriate: allergies, current medications, past family history, past medical history, past social history, past surgical history and problem list.   Objective:   Vitals:   11/29/18 0944  BP: (!) 112/54  Pulse: 60  Weight: 54.9 kg    Fetal Status: Fetal Heart Rate (bpm): 166 Fundal Height: 9 cm Movement: Absent     General:  Alert, oriented and cooperative. Patient is in no acute distress.  Skin: Skin is warm and dry. No rash noted.   Cardiovascular: Normal heart rate noted  Respiratory: Normal respiratory effort, no problems with respiration noted  Abdomen: Soft, gravid, appropriate for gestational age.  Pain/Pressure: Absent     Pelvic: Cervical exam deferred        Extremities: Normal range of motion.  Edema: None  Mental Status: Normal mood and affect. Normal behavior. Normal judgment and thought content.   Assessment and Plan:  Pregnancy: G3P1011 at [redacted]w[redacted]d There are no diagnoses linked to this encounter.  Reviewed lab results WNL Nausea resolved  Preterm labor symptoms and general obstetric precautions including but not limited to vaginal bleeding, contractions, leaking of fluid and fetal movement were reviewed in detail with the patient. Please refer to After Visit Summary for other counseling recommendations.   Return in about 4 weeks (around 12/27/2018) for [employer].  Future Appointments  Date Time Provider  [place]  12/13/2018  9:15 AM Seabron Spates, CNM [facility] None    Hansel Feinstein, CNM

## 2018-12-13 ENCOUNTER — Ambulatory Visit (INDEPENDENT_AMBULATORY_CARE_PROVIDER_SITE_OTHER): Payer: Medicaid Other | Admitting: Advanced Practice Midwife

## 2018-12-13 ENCOUNTER — Other Ambulatory Visit: Payer: Self-pay

## 2018-12-13 ENCOUNTER — Encounter: Payer: Self-pay | Admitting: Advanced Practice Midwife

## 2018-12-13 VITALS — BP 97/51 | HR 52 | Wt 120.0 lb

## 2018-12-13 DIAGNOSIS — Z3480 Encounter for supervision of other normal pregnancy, unspecified trimester: Secondary | ICD-10-CM

## 2018-12-13 DIAGNOSIS — Z3A11 11 weeks gestation of pregnancy: Secondary | ICD-10-CM

## 2018-12-13 DIAGNOSIS — Z3481 Encounter for supervision of other normal pregnancy, first trimester: Secondary | ICD-10-CM

## 2018-12-13 NOTE — Progress Notes (Signed)
Patient offered flu shot- denies. Kathrene Alu RN

## 2018-12-13 NOTE — Patient Instructions (Signed)

## 2018-12-13 NOTE — Progress Notes (Signed)
   PRENATAL VISIT NOTE  Subjective:  Emily Cantu is a 23 y.o. G3P1011 at [redacted]w[redacted]d being seen today for ongoing prenatal care.  She is currently monitored for the following issues for this low-risk pregnancy and has Normal labor; Supervision of other normal pregnancy, antepartum; and Subchorionic hematoma in first trimester on their problem list.  Patient reports no complaints.  Contractions: Not present. Vag. Bleeding: None.  Movement: Absent. Denies leaking of fluid.   The following portions of the patient's history were reviewed and updated as appropriate: allergies, current medications, past family history, past medical history, past social history, past surgical history and problem list.   Objective:   Vitals:   12/13/18 0930  BP: (!) 97/51  Pulse: (!) 52  Weight: 54.4 kg    Fetal Status: Fetal Heart Rate (bpm): 160 Fundal Height: 11 cm Movement: Absent     General:  Alert, oriented and cooperative. Patient is in no acute distress.  Skin: Skin is warm and dry. No rash noted.   Cardiovascular: Normal heart rate noted  Respiratory: Normal respiratory effort, no problems with respiration noted  Abdomen: Soft, gravid, appropriate for gestational age.  Pain/Pressure: Absent     Pelvic: Cervical exam deferred        Extremities: Normal range of motion.  Edema: None  Mental Status: Normal mood and affect. Normal behavior. Normal judgment and thought content.   Assessment and Plan:  Pregnancy: [id number] at [redacted]w[redacted]d 1. Supervision of other normal pregnancy, antepartum      Plan Panorama in 2 weeks then AFP at 15wks      Reviewed signs of flu and Covid-19 - [place] MFM OB COMP + 14 WK; Future  Preterm labor symptoms and general obstetric precautions including but not limited to vaginal bleeding, contractions, leaking of fluid and fetal movement were reviewed in detail with the patient. Please refer to After Visit Summary for other counseling recommendations.   Return in about 8 weeks (around  02/07/2019) for [employer].  Future Appointments  Date Time Provider [place]  12/26/2018  3:30 PM [facility] NURSE [facility] None  02/03/2019  2:30 PM [facility] [place] 1 [facility]  02/08/2019  3:00 PM Lavonia Drafts, MD [facility] None    Hansel Feinstein, CNM

## 2018-12-26 ENCOUNTER — Other Ambulatory Visit: Payer: Medicaid Other

## 2018-12-26 ENCOUNTER — Other Ambulatory Visit: Payer: Self-pay

## 2018-12-26 NOTE — Progress Notes (Addendum)
Patient presents for panorama lab draw. Patient panorama drawn and patient signed consent form. Patient plans to follow up for OB visit in one month. Patient has no complaints. Kathrene Alu RN  Attestation of Attending Supervision of RN: Evaluation and management procedures were performed by the nurse under my supervision and collaboration.  I have reviewed the nursing note and chart, and I agree with the management and plan.  Carolyn L. Harraway-Smith, M.D., Cherlynn June

## 2019-01-06 ENCOUNTER — Telehealth: Payer: Self-pay

## 2019-01-06 NOTE — Telephone Encounter (Signed)
Left message for patient to return call to office. To give her panorama results. Kathrene Alu RN  Patient called back and made aware that she is low risk panorama results and having a baby boy. Kathrene Alu RN

## 2019-02-03 ENCOUNTER — Ambulatory Visit (HOSPITAL_COMMUNITY)
Admission: RE | Admit: 2019-02-03 | Discharge: 2019-02-03 | Disposition: A | Discharge status: Home / Self Care | Payer: Medicaid Other | Source: Ambulatory Visit | Attending: Obstetrics and Gynecology | Admitting: Obstetrics and Gynecology

## 2019-02-03 ENCOUNTER — Other Ambulatory Visit: Payer: Self-pay

## 2019-02-03 DIAGNOSIS — Z3A18 18 weeks gestation of pregnancy: Secondary | ICD-10-CM | POA: Diagnosis not present

## 2019-02-03 DIAGNOSIS — Z363 Encounter for antenatal screening for malformations: Secondary | ICD-10-CM

## 2019-02-03 DIAGNOSIS — Z3480 Encounter for supervision of other normal pregnancy, unspecified trimester: Secondary | ICD-10-CM | POA: Diagnosis present

## 2019-02-04 ENCOUNTER — Encounter: Payer: Self-pay | Admitting: Advanced Practice Midwife

## 2019-02-06 ENCOUNTER — Other Ambulatory Visit (HOSPITAL_COMMUNITY): Payer: Self-pay | Admitting: *Deleted

## 2019-02-06 DIAGNOSIS — Z362 Encounter for other antenatal screening follow-up: Secondary | ICD-10-CM

## 2019-02-08 ENCOUNTER — Telehealth (INDEPENDENT_AMBULATORY_CARE_PROVIDER_SITE_OTHER): Payer: Medicaid Other | Admitting: Obstetrics & Gynecology

## 2019-02-08 ENCOUNTER — Other Ambulatory Visit: Payer: Self-pay

## 2019-02-08 ENCOUNTER — Encounter: Payer: Self-pay | Admitting: Obstetrics & Gynecology

## 2019-02-08 DIAGNOSIS — Z3482 Encounter for supervision of other normal pregnancy, second trimester: Secondary | ICD-10-CM

## 2019-02-08 DIAGNOSIS — Z3480 Encounter for supervision of other normal pregnancy, unspecified trimester: Secondary | ICD-10-CM

## 2019-02-08 DIAGNOSIS — Z3A19 19 weeks gestation of pregnancy: Secondary | ICD-10-CM

## 2019-02-08 NOTE — Progress Notes (Signed)
Patient agrees to [employer] visit today. Kathrene Alu RN

## 2019-02-08 NOTE — Progress Notes (Signed)
TELEHEALTH OBSTETRICS PRENATAL VIRTUAL VIDEO VISIT ENCOUNTER NOTE  Provider location: [facility] for [facility]   I connected with Shekinah A Neyland on 02/08/19 at  3:00 PM EST by MyChart Video Encounter at home and verified that I am speaking with the correct person using two identifiers.   I discussed the limitations, risks, security and privacy concerns of performing an evaluation and management service virtually and the availability of in person appointments. I also discussed with the patient that there may be a patient responsible charge related to this service. The patient expressed understanding and agreed to proceed. Subjective:  Emily Cantu is a 23 y.o. G3P1011 at [redacted]w[redacted]d being seen today for ongoing prenatal care.  She is currently monitored for the following issues for this low-risk pregnancy and has Normal labor; Supervision of other normal pregnancy, antepartum; and Subchorionic hematoma in first trimester on their problem list.  Patient reports no complaints.  Contractions: Not present. Vag. Bleeding: None.   . Denies any leaking of fluid.   The following portions of the patient's history were reviewed and updated as appropriate: allergies, current medications, past family history, past medical history, past social history, past surgical history and problem list.   Objective:  There were no vitals filed for this visit.  Fetal Status: LMP 09/24/2018 (Exact Date)    General:  Alert, oriented and cooperative. Patient is in no acute distress.  Respiratory: Normal respiratory effort, no problems with respiration noted  Mental Status: Normal mood and affect. Normal behavior. Normal judgment and thought content.  Rest of physical exam deferred due to type of encounter  Imaging: [place] MFM OB COMP + 14 WK  Result Date: 02/03/2019 ----------------------------------------------------------------------  OBSTETRICS REPORT                       (Signed Final  02/03/2019 03:43 pm) ---------------------------------------------------------------------- Patient Info  ID #:       [id number]                          D.O.B.:  1995-03-03 (23 yrs)  Name:       Emily Cantu                  Visit Date: 02/03/2019 02:38 pm ---------------------------------------------------------------------- Performed By  Performed By:     Emeline Darling BS,      Referred By:      Aviva Signs                    RDMS  Attending:        Noralee Space MD        [facility] ---------------------------------------------------------------------- Orders   #  Description                          Code         Ordered By   1  [place] MFM OB COMP + 14 WK  50277.41     Wynelle Bourgeois  ----------------------------------------------------------------------   #  Order #                    Accession #                 Episode #   1  [id number]                  [id number]                  [id number]  ---------------------------------------------------------------------- Indications   Encounter for antenatal screening for          Z36.3   malformations   [redacted] weeks gestation of pregnancy                Z3A.18   Low Risk NIPS  ---------------------------------------------------------------------- Fetal Evaluation  Num Of Fetuses:         1  Fetal Heart Rate(bpm):  148  Cardiac Activity:       Observed  Presentation:           Cephalic  Placenta:               Anterior  P. Cord Insertion:      Visualized  Amniotic Fluid  AFI FV:      Within normal limits                              Largest Pocket(cm)                              4.3 ---------------------------------------------------------------------- Biometry  BPD:      44.3  mm     G. Age:  19w 3d         73  %    CI:        73.64   %    70 - 86                                                          FL/HC:      17.7   %    16.1 - 18.3  HC:       164   mm     G.  Age:  19w 1d         57  %    HC/AC:      1.08        1.09 - 1.39  AC:      152.1  mm     G. Age:  20w 3d         90  %    FL/BPD:     65.5   %  FL:         29  mm     G. Age:  18w 6d         46  %    FL/AC:      19.1   %    20 - 24  NFT:       3.6  mm  Est. FW:     305  gm    0 lb 11 oz  88  % ---------------------------------------------------------------------- OB History  Gravidity:    3         Term:   1        Prem:   0        SAB:   0  TOP:          1       Ectopic:  0        Living: 1 ---------------------------------------------------------------------- Gestational Age  LMP:           18w 6d        Date:  09/24/18                 EDD:   07/01/19  U/S Today:     19w 3d                                        EDD:   06/27/19  Best:          18w 6d     Det. By:  LMP  (09/24/18)          EDD:   07/01/19 ---------------------------------------------------------------------- Anatomy  Cranium:               Appears normal         LVOT:                   Appears normal  Cavum:                 Appears normal         Aortic Arch:            Not well visualized  Ventricles:            Appears normal         Ductal Arch:            Appears normal  Choroid Plexus:        Appears normal         Diaphragm:              Appears normal  Cerebellum:            Appears normal         Stomach:                Appears normal, left                                                                        sided  Posterior Fossa:       Appears normal         Abdomen:                Appears normal  Nuchal Fold:           Appears normal         Abdominal Wall:         Appears nml (cord  insert, abd wall)  Face:                  Orbits appear          Cord Vessels:           Appears normal (3                         normal                                         vessel cord)  Lips:                  Appears normal         Kidneys:                Appear normal   Palate:                Appears normal         Bladder:                Appears normal  Thoracic:              Appears normal         Spine:                  Not well visualized  Heart:                 Not well visualized    Upper Extremities:      Appears normal  RVOT:                  Not well visualized    Lower Extremities:      Appears normal  Other:  Female gender. Heels visualized. Technically difficult due to fetal          position. ---------------------------------------------------------------------- Cervix Uterus Adnexa  Cervix  Length:            3.5  cm.  Normal appearance by transabdominal scan. ---------------------------------------------------------------------- Impression  We performed fetal anatomy scan. No makers of  aneuploidies or fetal structural defects are seen. Fetal  biometry is consistent with her previously-established dates.  Amniotic fluid is normal and good fetal activity is seen.  Patient understands the limitations of ultrasound in detecting  fetal anomalies.  On cell-free fetal DNA screening, the risks of fetal  aneuploidies are not increased. ---------------------------------------------------------------------- Recommendations  -An appointment was made for her to return in 6 weeks for  completion of fetal anatomy. ----------------------------------------------------------------------                  Noralee Space, MD Electronically Signed Final Report   02/03/2019 03:43 pm ----------------------------------------------------------------------   Assessment and Plan:  Pregnancy: G3P1011 at [redacted]w[redacted]d 1. Supervision of other normal pregnancy, antepartum Pt reports occ pain in  Cervical length 3.5cm  Pt has f/u US 03/16/2018      Preterm labor symptoms and general obstetric precautions including but not limited to vaginal bleeding, contractions, leaking of fluid and fetal movement were reviewed in detail with the patient. I discussed the assessment and treatment plan with the  patient. The patient was provided an opportunity to ask questions and all were answered. The patient agreed with the plan and demonstrated an understanding of the instructions. The patient was advised to call back or seek an  in-person office evaluation/go to MAU at [facility] for any urgent or concerning symptoms. Please refer to After Visit Summary for other counseling recommendations.   I provided 12 minutes of face-to-face time during this encounter.  No follow-ups on file.  Future Appointments  Date Time Provider [place]  02/09/2019  3:15 PM [place]: [place], [place] P[facility]-PEC PEC  03/17/2019  2:45 PM [place] 2 WH-MFCUS MFC-US    [place]avonia Drafts, [place] for [facility], [place]

## 2019-02-09 ENCOUNTER — Other Ambulatory Visit: Payer: Medicaid Other

## 2019-02-24 NOTE — L&D Delivery Note (Signed)
Delivery Note At 5:27 PM a viable female was delivered via Vaginal, Spontaneous (Presentation:   Occiput Anterior).  APGAR: 8, 9; weight pending .   Placenta status: Spontaneous;Expressed, Intact.  Cord: 3 vessels with the following complications: None.  Cord pH: NA  Anesthesia: Epidural Episiotomy: None Lacerations: None Suture Repair: NA Est. Blood Loss (mL): 53  Mom to postpartum.  Baby to Couplet care / Skin to Skin.  Emily Cantu 06/18/2019, 6:01 PM

## 2019-03-03 ENCOUNTER — Ambulatory Visit (INDEPENDENT_AMBULATORY_CARE_PROVIDER_SITE_OTHER): Payer: Medicaid Other | Admitting: Family Medicine

## 2019-03-03 ENCOUNTER — Other Ambulatory Visit: Payer: Self-pay

## 2019-03-03 VITALS — BP 94/59 | HR 69 | Wt 126.1 lb

## 2019-03-03 DIAGNOSIS — M9903 Segmental and somatic dysfunction of lumbar region: Secondary | ICD-10-CM

## 2019-03-03 DIAGNOSIS — Z3A22 22 weeks gestation of pregnancy: Secondary | ICD-10-CM

## 2019-03-03 DIAGNOSIS — Z3480 Encounter for supervision of other normal pregnancy, unspecified trimester: Secondary | ICD-10-CM

## 2019-03-03 DIAGNOSIS — O99891 Other specified diseases and conditions complicating pregnancy: Secondary | ICD-10-CM

## 2019-03-03 DIAGNOSIS — M9904 Segmental and somatic dysfunction of sacral region: Secondary | ICD-10-CM | POA: Diagnosis not present

## 2019-03-03 DIAGNOSIS — M549 Dorsalgia, unspecified: Secondary | ICD-10-CM | POA: Diagnosis not present

## 2019-03-03 DIAGNOSIS — M9905 Segmental and somatic dysfunction of pelvic region: Secondary | ICD-10-CM

## 2019-03-03 NOTE — Progress Notes (Signed)
   PRENATAL VISIT NOTE  Subjective:  Emily Cantu is a 24 y.o. G3P1011 at [redacted]w[redacted]d being seen today for ongoing prenatal care.  She is currently monitored for the following issues for this low-risk pregnancy and has Normal labor; Supervision of other normal pregnancy, antepartum; and Subchorionic hematoma in first trimester on their problem list.  Patient reports backache.  Contractions: Not present. Vag. Bleeding: None.  Movement: Present. Denies leaking of fluid.   The following portions of the patient's history were reviewed and updated as appropriate: allergies, current medications, past family history, past medical history, past social history, past surgical history and problem list. Problem list updated.  Objective:   Vitals:   03/03/19 0953 03/03/19 1002  BP: (!) 86/46 (!) 94/59  Pulse: 64 69  Weight: 126 lb 2.1 oz (57.2 kg)     Fetal Status: Fetal Heart Rate (bpm): 141   Movement: Present     General:  Alert, oriented and cooperative. Patient is in no acute distress.  Skin: Skin is warm and dry. No rash noted.   Cardiovascular: Normal heart rate noted  Respiratory: Normal respiratory effort, no problems with respiration noted  Abdomen: Soft, gravid, appropriate for gestational age. Pain/Pressure: Present     Pelvic:  Cervical exam deferred        MSK: Restriction, tenderness, tissue texture changes, and paraspinal spasm in the right lumbar spine  Neuro: Moves all four extremities with no focal neurological deficit  Extremities: Normal range of motion.  Edema: None  Mental Status: Normal mood and affect. Normal behavior. Normal judgment and thought content.   OSE: Head   Cervical   Thoracic   Rib   Lumbar L5 ESRR, L1 ESRL  Sacrum L/L  Pelvis Right ant innom    Assessment and Plan:  Pregnancy: G3P1011 at [redacted]w[redacted]d  1. Supervision of other normal pregnancy, antepartum FHT and FH normal  2. Back pain affecting pregnancy in third trimester 3. Somatic dysfunction of  lumbar region 4. Somatic dysfunction of sacral region 5. Somatic dysfunction of pelvis region OMT done after patient permission. HVLA technique utilized. 3 areas treated with improvement of tissue texture and joint mobility. Patient tolerated procedure well.     Preterm labor symptoms and general obstetric precautions including but not limited to vaginal bleeding, contractions, leaking of fluid and fetal movement were reviewed in detail with the patient. Please refer to After Visit Summary for other counseling recommendations.  Return in about 2 weeks (around 03/17/2019) for OB f/u, 2 hr GTT, In Office.  Levie Heritage, DO

## 2019-03-16 ENCOUNTER — Telehealth: Payer: Self-pay

## 2019-03-16 NOTE — Telephone Encounter (Signed)
Pt sent Mychart message stating she had food poisoning. Called pt, pt states she feels much better. Advised pt to continue drinking fluids and to call the office if she has any more symptoms.  Understanding was voiced. Jayline Kilburg l Sahaana Weitman, CMA

## 2019-03-17 ENCOUNTER — Other Ambulatory Visit: Payer: Self-pay

## 2019-03-17 ENCOUNTER — Ambulatory Visit (HOSPITAL_COMMUNITY)
Admission: RE | Admit: 2019-03-17 | Discharge: 2019-03-17 | Disposition: A | Discharge status: Home / Self Care | Payer: Medicaid Other | Source: Ambulatory Visit | Attending: Obstetrics and Gynecology | Admitting: Obstetrics and Gynecology

## 2019-03-17 ENCOUNTER — Ambulatory Visit (INDEPENDENT_AMBULATORY_CARE_PROVIDER_SITE_OTHER): Payer: Medicaid Other | Admitting: Family Medicine

## 2019-03-17 VITALS — BP 114/64 | HR 84

## 2019-03-17 DIAGNOSIS — M9904 Segmental and somatic dysfunction of sacral region: Secondary | ICD-10-CM

## 2019-03-17 DIAGNOSIS — Z362 Encounter for other antenatal screening follow-up: Secondary | ICD-10-CM | POA: Diagnosis not present

## 2019-03-17 DIAGNOSIS — M549 Dorsalgia, unspecified: Secondary | ICD-10-CM

## 2019-03-17 DIAGNOSIS — Z3480 Encounter for supervision of other normal pregnancy, unspecified trimester: Secondary | ICD-10-CM

## 2019-03-17 DIAGNOSIS — Z3A24 24 weeks gestation of pregnancy: Secondary | ICD-10-CM

## 2019-03-17 DIAGNOSIS — M9903 Segmental and somatic dysfunction of lumbar region: Secondary | ICD-10-CM

## 2019-03-17 DIAGNOSIS — O99891 Other specified diseases and conditions complicating pregnancy: Secondary | ICD-10-CM

## 2019-03-17 DIAGNOSIS — B3731 Acute candidiasis of vulva and vagina: Secondary | ICD-10-CM

## 2019-03-17 DIAGNOSIS — B373 Candidiasis of vulva and vagina: Secondary | ICD-10-CM

## 2019-03-17 DIAGNOSIS — M9905 Segmental and somatic dysfunction of pelvic region: Secondary | ICD-10-CM

## 2019-03-17 MED ORDER — FLUCONAZOLE 150 MG PO TABS: 150.0000 mg | ORAL_TABLET | Freq: Once | ORAL | 0 refills | Status: AC

## 2019-03-17 NOTE — Progress Notes (Addendum)
   PRENATAL VISIT NOTE  Subjective:  Emily Cantu is a 24 y.o. G3P1011 at [redacted]w[redacted]d being seen today for ongoing prenatal care.  She is currently monitored for the following issues for this low-risk pregnancy and has Normal labor; Supervision of other normal pregnancy, antepartum; and Subchorionic hematoma in first trimester on their problem list.  Patient reports backache. Improved after OMT last visit, then became worse about 4 days ago. Also c/o clumpy white discharge with vaginal irritation and itching. Contractions: Not present. Vag. Bleeding: None.  Movement: Present. Denies leaking of fluid.   The following portions of the patient's history were reviewed and updated as appropriate: allergies, current medications, past family history, past medical history, past social history, past surgical history and problem list. Problem list updated.  Objective:   Vitals:   03/17/19 0827  BP: 114/64  Pulse: 84    Fetal Status: Fetal Heart Rate (bpm): 145 Fundal Height: 24 cm Movement: Present     General:  Alert, oriented and cooperative. Patient is in no acute distress.  Skin: Skin is warm and dry. No rash noted.   Cardiovascular: Normal heart rate noted  Respiratory: Normal respiratory effort, no problems with respiration noted  Abdomen: Soft, gravid, appropriate for gestational age. Pain/Pressure: Absent     Pelvic:  Cervical exam deferred        MSK: Restriction, tenderness, tissue texture changes, and paraspinal spasm in the right lumbar spine  Neuro: Moves all four extremities with no focal neurological deficit  Extremities: Normal range of motion.  Edema: None  Mental Status: Normal mood and affect. Normal behavior. Normal judgment and thought content.   OSE: Head   Cervical   Thoracic   Rib   Lumbar L5 ESRR  Sacrum L/L  Pelvis Right ant innom    Assessment and Plan:  Pregnancy: G3P1011 at [redacted]w[redacted]d  1. Supervision of other normal pregnancy, antepartum FHT and FH normal -  Glucose Tolerance, 2 Hours w/1 Hour - RPR - HIV antibody (with reflex) - CBC  2. Back pain affecting pregnancy in third trimester 3. Somatic dysfunction of lumbar region 4. Somatic dysfunction of sacral region 5. Somatic dysfunction of pelvis region OMT done after patient permission. HVLA technique utilized. 3 areas treated with improvement of tissue texture and joint mobility. Patient tolerated procedure well.   6. Yeast Vulvovaginitis Diflucan x1 dose.  Preterm labor symptoms and general obstetric precautions including but not limited to vaginal bleeding, contractions, leaking of fluid and fetal movement were reviewed in detail with the patient. Please refer to After Visit Summary for other counseling recommendations.  Return in about 4 weeks (around 04/14/2019) for OB f/u, back pain, In Office.  Levie Heritage, DO

## 2019-03-17 NOTE — Progress Notes (Signed)
Patient planning to do two hour gtt today. Armandina Stammer RN

## 2019-03-18 LAB — GLUCOSE TOLERANCE, 2 HOURS W/ 1HR
Glucose, 1 hour: 46 mg/dL — ABNORMAL LOW (ref 65–179)
Glucose, 2 hour: 74 mg/dL (ref 65–152)
Glucose, Fasting: 66 mg/dL (ref 65–91)

## 2019-03-18 LAB — CBC
Hematocrit: 33.7 % — ABNORMAL LOW (ref 34.0–46.6)
Hemoglobin: 12 g/dL (ref 11.1–15.9)
MCH: 30.8 pg (ref 26.6–33.0)
MCHC: 35.6 g/dL (ref 31.5–35.7)
MCV: 87 fL (ref 79–97)
Platelets: 188 10*3/uL (ref 150–450)
RBC: 3.89 x10E6/uL (ref 3.77–5.28)
RDW: 12.7 % (ref 11.7–15.4)
WBC: 3.8 10*3/uL (ref 3.4–10.8)

## 2019-03-18 LAB — HIV ANTIBODY (ROUTINE TESTING W REFLEX): HIV Screen 4th Generation wRfx: NONREACTIVE

## 2019-03-18 LAB — RPR: RPR Ser Ql: NONREACTIVE

## 2019-04-10 ENCOUNTER — Telehealth: Payer: Self-pay

## 2019-04-10 NOTE — Telephone Encounter (Signed)
Pt called the office stating that she is having some muscle pain in her right leg. Pt states she has not taken anything. Advised pt to take some tylenol until she is able to be seen in the office.Understanding was voiced. Britta Louth l Hedy Garro, CMA

## 2019-04-11 ENCOUNTER — Other Ambulatory Visit: Payer: Self-pay | Admitting: Family Medicine

## 2019-04-11 MED ORDER — CYCLOBENZAPRINE HCL 10 MG PO TABS: 10.0000 mg | ORAL_TABLET | Freq: Three times a day (TID) | ORAL | 2 refills | Status: DC | PRN

## 2019-04-12 ENCOUNTER — Telehealth: Payer: Self-pay

## 2019-04-12 NOTE — Telephone Encounter (Signed)
-----   Message from Levie Heritage, DO sent at 04/11/2019  4:26 PM EST ----- Regarding: RE: Pain I sent a prescription for flexeril to the pharmacy. This is a muscle relaxant and should help. Please warn her that it can make her sleepy. If her pain doesn't improve with this, please let me know and I will work her in. ----- Message ----- From: Mikey Bussing, CMA Sent: 04/10/2019   3:50 PM EST To: Levie Heritage, DO Subject: RE: Pain                                       I spoke with her again, she said it starts in her back and moves around to her side. She is not having having any redness or swelling.  ----- Message ----- From: Levie Heritage, DO Sent: 04/10/2019   2:54 PM EST To: Mikey Bussing, CMA Subject: RE: Pain                                       Is she having swelling, redness in it? Where on her right leg is she feeling the pain? ----- Message ----- From: Mikey Bussing, CMA Sent: 04/10/2019   2:37 PM EST To: Levie Heritage, DO Subject: Pain                                           Pt called the office complaining of muscle pain in her right leg. We can't bring her in this week but she is scheduled to come in on 04/20/19. Is there anything that you can prescribe for her?

## 2019-04-12 NOTE — Telephone Encounter (Signed)
Called pt regarding Rx. Pt made aware that Flexeril was sent to her pharmacy and it can make her sleepy. Pt also made aware that she can call the office if the medication does not provide any relief.Understanding was voiced.  Kimberley Dastrup l Patrik Turnbaugh, CMA

## 2019-04-20 ENCOUNTER — Ambulatory Visit (INDEPENDENT_AMBULATORY_CARE_PROVIDER_SITE_OTHER): Payer: Medicaid Other | Admitting: Family Medicine

## 2019-04-20 ENCOUNTER — Other Ambulatory Visit: Payer: Self-pay

## 2019-04-20 VITALS — BP 104/64 | HR 85 | Wt 143.0 lb

## 2019-04-20 DIAGNOSIS — M9903 Segmental and somatic dysfunction of lumbar region: Secondary | ICD-10-CM

## 2019-04-20 DIAGNOSIS — O99891 Other specified diseases and conditions complicating pregnancy: Secondary | ICD-10-CM

## 2019-04-20 DIAGNOSIS — M9905 Segmental and somatic dysfunction of pelvic region: Secondary | ICD-10-CM

## 2019-04-20 DIAGNOSIS — Z3480 Encounter for supervision of other normal pregnancy, unspecified trimester: Secondary | ICD-10-CM

## 2019-04-20 DIAGNOSIS — Z3A29 29 weeks gestation of pregnancy: Secondary | ICD-10-CM

## 2019-04-20 DIAGNOSIS — M9904 Segmental and somatic dysfunction of sacral region: Secondary | ICD-10-CM

## 2019-04-20 DIAGNOSIS — M549 Dorsalgia, unspecified: Secondary | ICD-10-CM

## 2019-04-20 NOTE — Progress Notes (Signed)
   PRENATAL VISIT NOTE  Subjective:  Tienna A Cantu is a 24 y.o. G3P1011 at [redacted]w[redacted]d being seen today for ongoing prenatal care.  She is currently monitored for the following issues for this low-risk pregnancy and has Normal labor; Supervision of other normal pregnancy, antepartum; and Subchorionic hematoma in first trimester on their problem list.  Patient reports continued back pain, right hip pain. Has been having difficulty walking some, especially if she walks a lot. Flexeril somewhat helpful, but makes her drowsy..  Contractions: Irregular. Vag. Bleeding: None.  Movement: Present. Denies leaking of fluid.   The following portions of the patient's history were reviewed and updated as appropriate: allergies, current medications, past family history, past medical history, past social history, past surgical history and problem list. Problem list updated.  Objective:   Vitals:   04/20/19 1550  BP: 104/64  Pulse: 85  Weight: 143 lb (64.9 kg)    Fetal Status: Fetal Heart Rate (bpm): 141   Movement: Present     General:  Alert, oriented and cooperative. Patient is in no acute distress.  Skin: Skin is warm and dry. No rash noted.   Cardiovascular: Normal heart rate noted  Respiratory: Normal respiratory effort, no problems with respiration noted  Abdomen: Soft, gravid, appropriate for gestational age. Pain/Pressure: Present     Pelvic:  Cervical exam deferred        MSK: Restriction, tenderness, tissue texture changes, and paraspinal spasm in the right lumbar spine, right gluteal muscles  Neuro: Moves all four extremities with no focal neurological deficit  Extremities: Normal range of motion.  Edema: None  Mental Status: Normal mood and affect. Normal behavior. Normal judgment and thought content.   OSE: Head   Cervical   Thoracic   Rib   Lumbar L5 ESRR  Sacrum L/L  Pelvis Right ant innom    Assessment and Plan:  Pregnancy: G3P1011 at [redacted]w[redacted]d  1. Supervision of other normal  pregnancy, antepartum FHT and FH normal  2. Back pain affecting pregnancy in third trimester 3. Somatic dysfunction of lumbar region 4. Somatic dysfunction of sacral region 5. Somatic dysfunction of pelvis region OMT done after patient permission. HVLA and ME technique utilized. 3 areas treated with mild improvement. Patient tolerated procedure well. Having a lot of muscle spasms - will treat with flexeril over week. Stretches taught. F/u 1 week   Preterm labor symptoms and general obstetric precautions including but not limited to vaginal bleeding, contractions, leaking of fluid and fetal movement were reviewed in detail with the patient. Please refer to After Visit Summary for other counseling recommendations.  No follow-ups on file.  Levie Heritage, DO

## 2019-04-25 ENCOUNTER — Telehealth: Payer: Self-pay

## 2019-04-25 NOTE — Telephone Encounter (Signed)
Pt called the office requesting a letter for dental services. A letter was faxed to [facility] in [place]. Tannie Koskela l Skyeler Smola, CMA

## 2019-04-27 ENCOUNTER — Ambulatory Visit (INDEPENDENT_AMBULATORY_CARE_PROVIDER_SITE_OTHER): Payer: Medicaid Other | Admitting: Family Medicine

## 2019-04-27 ENCOUNTER — Other Ambulatory Visit: Payer: Self-pay

## 2019-04-27 VITALS — BP 105/54 | HR 65 | Wt 144.1 lb

## 2019-04-27 DIAGNOSIS — Z3480 Encounter for supervision of other normal pregnancy, unspecified trimester: Secondary | ICD-10-CM

## 2019-04-27 DIAGNOSIS — M9903 Segmental and somatic dysfunction of lumbar region: Secondary | ICD-10-CM

## 2019-04-27 DIAGNOSIS — O99891 Other specified diseases and conditions complicating pregnancy: Secondary | ICD-10-CM

## 2019-04-27 DIAGNOSIS — Z3A30 30 weeks gestation of pregnancy: Secondary | ICD-10-CM

## 2019-04-27 DIAGNOSIS — M9904 Segmental and somatic dysfunction of sacral region: Secondary | ICD-10-CM

## 2019-04-27 DIAGNOSIS — M549 Dorsalgia, unspecified: Secondary | ICD-10-CM

## 2019-04-27 DIAGNOSIS — M9905 Segmental and somatic dysfunction of pelvic region: Secondary | ICD-10-CM | POA: Diagnosis not present

## 2019-04-27 NOTE — Progress Notes (Signed)
   PRENATAL VISIT NOTE  Subjective:  Emily Cantu is a 24 y.o. G3P1011 at [redacted]w[redacted]d being seen today for ongoing prenatal care.  She is currently monitored for the following issues for this low-risk pregnancy and has Normal labor; Supervision of other normal pregnancy, antepartum; and Subchorionic hematoma in first trimester on their problem list.  Patient reports back pain still present. Stopped the flexeril because makes her too sedated. .  Contractions: Irregular. Vag. Bleeding: None.  Movement: Present. Denies leaking of fluid.   The following portions of the patient's history were reviewed and updated as appropriate: allergies, current medications, past family history, past medical history, past social history, past surgical history and problem list. Problem list updated.  Objective:   Vitals:   04/27/19 1059  BP: (!) 105/54  Pulse: 65  Weight: 144 lb 1.9 oz (65.4 kg)    Fetal Status: Fetal Heart Rate (bpm): 136   Movement: Present     General:  Alert, oriented and cooperative. Patient is in no acute distress.  Skin: Skin is warm and dry. No rash noted.   Cardiovascular: Normal heart rate noted  Respiratory: Normal respiratory effort, no problems with respiration noted  Abdomen: Soft, gravid, appropriate for gestational age. Pain/Pressure: Present     Pelvic:  Cervical exam deferred        MSK: Restriction, tenderness, tissue texture changes, and paraspinal spasm in the lumbar spine  Neuro: Moves all four extremities with no focal neurological deficit  Extremities: Normal range of motion.  Edema: None  Mental Status: Normal mood and affect. Normal behavior. Normal judgment and thought content.   OSE: Head   Cervical   Thoracic   Rib   Lumbar L5 ESRR  Sacrum R/R  Pelvis Right ant innom    Assessment and Plan:  Pregnancy: G3P1011 at [redacted]w[redacted]d  1. Supervision of other normal pregnancy, antepartum fht and fh normal  2. Back pain affecting pregnancy in third trimester 3.  Somatic dysfunction of lumbar region 4. Somatic dysfunction of sacral region 5. Somatic dysfunction of pelvis region OMT done after patient permission. HVLA technique utilized. 3 areas treated with improvement of tissue texture and joint mobility. Patient tolerated procedure well.    Preterm labor symptoms and general obstetric precautions including but not limited to vaginal bleeding, contractions, leaking of fluid and fetal movement were reviewed in detail with the patient. Please refer to After Visit Summary for other counseling recommendations.  No follow-ups on file.  Levie Heritage, DO

## 2019-05-16 ENCOUNTER — Inpatient Hospital Stay (HOSPITAL_COMMUNITY)
Admission: AD | Admit: 2019-05-16 | Discharge: 2019-05-16 | Disposition: A | Discharge status: Home / Self Care | Payer: Medicaid Other | Attending: Obstetrics and Gynecology | Admitting: Obstetrics and Gynecology

## 2019-05-16 ENCOUNTER — Other Ambulatory Visit: Payer: Self-pay

## 2019-05-16 ENCOUNTER — Telehealth: Payer: Self-pay

## 2019-05-16 ENCOUNTER — Encounter (HOSPITAL_COMMUNITY): Payer: Self-pay | Admitting: Obstetrics and Gynecology

## 2019-05-16 DIAGNOSIS — R1030 Lower abdominal pain, unspecified: Secondary | ICD-10-CM | POA: Insufficient documentation

## 2019-05-16 DIAGNOSIS — R109 Unspecified abdominal pain: Secondary | ICD-10-CM | POA: Diagnosis not present

## 2019-05-16 DIAGNOSIS — Z3689 Encounter for other specified antenatal screening: Secondary | ICD-10-CM

## 2019-05-16 DIAGNOSIS — O26893 Other specified pregnancy related conditions, third trimester: Secondary | ICD-10-CM

## 2019-05-16 DIAGNOSIS — M7918 Myalgia, other site: Secondary | ICD-10-CM | POA: Diagnosis not present

## 2019-05-16 DIAGNOSIS — M791 Myalgia, unspecified site: Secondary | ICD-10-CM | POA: Insufficient documentation

## 2019-05-16 DIAGNOSIS — Z3A33 33 weeks gestation of pregnancy: Secondary | ICD-10-CM | POA: Diagnosis not present

## 2019-05-16 DIAGNOSIS — O99891 Other specified diseases and conditions complicating pregnancy: Secondary | ICD-10-CM | POA: Diagnosis not present

## 2019-05-16 DIAGNOSIS — Z3480 Encounter for supervision of other normal pregnancy, unspecified trimester: Secondary | ICD-10-CM

## 2019-05-16 LAB — URINALYSIS, ROUTINE W REFLEX MICROSCOPIC
Bilirubin Urine: NEGATIVE
Glucose, UA: NEGATIVE mg/dL
Hgb urine dipstick: NEGATIVE
Ketones, ur: NEGATIVE mg/dL
Nitrite: NEGATIVE
Protein, ur: NEGATIVE mg/dL
Specific Gravity, Urine: 1.011 (ref 1.005–1.030)
pH: 8 (ref 5.0–8.0)

## 2019-05-16 LAB — WET PREP, GENITAL
Clue Cells Wet Prep HPF POC: NONE SEEN
Sperm: NONE SEEN
Trich, Wet Prep: NONE SEEN
Yeast Wet Prep HPF POC: NONE SEEN

## 2019-05-16 LAB — FETAL FIBRONECTIN: Fetal Fibronectin: NEGATIVE

## 2019-05-16 MED ORDER — ACETAMINOPHEN 500 MG PO TABS
1000.0000 mg | ORAL_TABLET | Freq: Once | ORAL | Status: AC
  Administered 2019-05-16: 1000 mg via ORAL
  Filled 2019-05-16: qty 2

## 2019-05-16 NOTE — Discharge Instructions (Signed)

## 2019-05-16 NOTE — MAU Note (Signed)
.   Emily Cantu is a 24 y.o. at [redacted]w[redacted]d here in MAU reporting: Lower abdominal pain that started x3 days ago. She also states she is having a burning sensation in hr abdomen. No VB or LOF. Patient endorses good fetal movement.   Pain score: 8 Vitals:   05/16/19 0934  BP: 117/75  Pulse: 96  Resp: 16  Temp: 97.6 F (36.4 C)  SpO2: 100%     FHT:154 Lab orders placed from triage:

## 2019-05-16 NOTE — MAU Provider Note (Signed)
History     CSN: [id number]  Arrival date and time: 05/16/19 3888   First Provider Initiated Contact with Patient 05/16/19 1056      Chief Complaint  Patient presents with  . Abdominal Pain   HPI Emily Cantu is a 24 y.o. G3P1011 at 41w3dwho presents to MAU with chief complaint of "burning" across her lower abdomen. This is a new problem, onset three days ago. Patient states she believes it is her body stretching to accommodate her baby but wanted to be sure. She rates her pain as 8/10. She denies aggravating or alleviating factors. She has not taken medication or tried other treatments for this complaint. She denies lower abdominal contractions, vaginal bleeding, leaking of fluid, decreased fetal movement, fever, falls, or recent illness.   She receives care with [facility] and her next appointment is 05/18/2019.  OB History    Gravida  3   Para  1   Term  1   Preterm      AB  1   Living  1     SAB      TAB  1   Ectopic      Multiple  0   Live Births  1           Past Medical History:  Diagnosis Date  . Abortion   . Depression     Past Surgical History:  Procedure Laterality Date  . NO PAST SURGERIES    . THERAPEUTIC ABORTION  05/28/2018    Family History  Problem Relation Age of Onset  . Hypertension Mother   . Cancer Father   . Hypertension Maternal Grandmother     Social History   Tobacco Use  . Smoking status: Never Smoker  . Smokeless tobacco: Never Used  Substance Use Topics  . Alcohol use: No  . Drug use: No    Allergies: No Known Allergies  Medications Prior to Admission  Medication Sig Dispense Refill Last Dose  . Prenatal Vit-Fe Fumarate-FA (PRENATAL VITAMINS PO) Take by mouth.   05/16/2019 at Unknown time  . AMBULATORY NON FORMULARY MEDICATION 1 Device by Other route once a week. Blood pressure cuff/Medium Monitored Regularly at home. ICD 10: Z34.90 LROB (Patient not taking: Reported on 04/20/2019) 1 kit 0   .  cyclobenzaprine (FLEXERIL) 10 MG tablet Take 1 tablet (10 mg total) by mouth 3 (three) times daily as needed for muscle spasms. (Patient not taking: Reported on 04/20/2019) 30 tablet 2     Review of Systems  Constitutional: Negative for chills, fatigue and fever.  Respiratory: Negative for shortness of breath.   Gastrointestinal: Positive for abdominal pain.  Genitourinary: Negative for dysuria, vaginal bleeding, vaginal discharge and vaginal pain.  Musculoskeletal: Negative for back pain.  All other systems reviewed and are negative.  Physical Exam   Blood pressure 117/75, pulse 96, temperature 97.6 F (36.4 C), resp. rate 16, height _0  (1.6 m), weight 65.4 kg, last menstrual period 09/24/2018, SpO2 100 %.  Physical Exam  Nursing note and vitals reviewed. Constitutional: She is oriented to person, place, and time. She appears well-developed and well-nourished.  Cardiovascular: Normal rate and normal heart sounds.  Respiratory: Effort normal and breath sounds normal. She has no decreased breath sounds.  GI: Soft. She exhibits no distension. There is no abdominal tenderness. There is no rebound and no guarding.  Gravid  Neurological: She is alert and oriented to person, place, and time.  Skin: Skin is warm and dry.  Psychiatric: She has a normal mood and affect. Her behavior is normal. Judgment and thought content normal.    MAU Course/MDM  Procedures   --Reactive fetal tracing: baseline 130, mod variability, positive accels, no decels --Toco: contractions x 4 on 90 minutes of continuous monitoring, not felt by patient --FFN negative --OB history term SVD x 1 --Abdominal "burning" resolved with rest and PO Tylenol  Patient Vitals for the past 24 hrs:  BP Temp Temp src Pulse Resp SpO2 Height Weight  05/16/19 1110 111/61 -- Oral 85 16 100 % -- --  05/16/19 0934 117/75 97.6 F (36.4 C) -- 96 16 100 % _0  (1.6 m) 65.4 kg   Results for orders placed or performed during the  hospital encounter of 05/16/19 (from the past 24 hour(s))  Urinalysis, Routine w reflex microscopic     Status: Abnormal   Collection Time: 05/16/19  9:35 AM  Result Value Ref Range   Color, Urine YELLOW YELLOW   APPearance CLOUDY (A) CLEAR   Specific Gravity, Urine 1.011 1.005 - 1.030   pH 8.0 5.0 - 8.0   Glucose, UA NEGATIVE NEGATIVE mg/dL   Hgb urine dipstick NEGATIVE NEGATIVE   Bilirubin Urine NEGATIVE NEGATIVE   Ketones, ur NEGATIVE NEGATIVE mg/dL   Protein, ur NEGATIVE NEGATIVE mg/dL   Nitrite NEGATIVE NEGATIVE   Leukocytes,Ua SMALL (A) NEGATIVE   RBC / HPF 0-5 0 - 5 RBC/hpf   WBC, UA 11-20 0 - 5 WBC/hpf   Bacteria, UA MANY (A) NONE SEEN   Squamous Epithelial / LPF 0-5 0 - 5   Mucus PRESENT    Amorphous Crystal PRESENT   Fetal fibronectin     Status: None   Collection Time: 05/16/19  9:55 AM  Result Value Ref Range   Fetal Fibronectin NEGATIVE NEGATIVE  Wet prep, genital     Status: Abnormal   Collection Time: 05/16/19  9:55 AM  Result Value Ref Range   Yeast Wet Prep HPF POC NONE SEEN NONE SEEN   Trich, Wet Prep NONE SEEN NONE SEEN   Clue Cells Wet Prep HPF POC NONE SEEN NONE SEEN   WBC, Wet Prep HPF POC MANY (A) NONE SEEN   Sperm NONE SEEN    Assessment and Plan  --24 y.o. G3P1011 at [redacted]w[redacted]d --Reactive tracing, closed cervix --MSK pain in pregnancy, continue Tylenol PRN --Discharge home in stable condition with precautions  F/U: --[facility] 05/18/2019  SDarlina Rumpf, CNM 05/16/2019, 1:19 PM

## 2019-05-16 NOTE — Telephone Encounter (Signed)
Pt called stating she has been having abdominal pain since Friday. Advised pt to go to [facility] at [facility]. Understanding was voiced. Michela Herst l Grisell Bissette, CMA

## 2019-05-17 LAB — CULTURE, OB URINE: Culture: <10000 — AB

## 2019-05-18 ENCOUNTER — Ambulatory Visit (INDEPENDENT_AMBULATORY_CARE_PROVIDER_SITE_OTHER): Payer: Medicaid Other | Admitting: Family Medicine

## 2019-05-18 ENCOUNTER — Other Ambulatory Visit: Payer: Self-pay

## 2019-05-18 VITALS — BP 103/62 | HR 76 | Wt 151.1 lb

## 2019-05-18 DIAGNOSIS — M9905 Segmental and somatic dysfunction of pelvic region: Secondary | ICD-10-CM

## 2019-05-18 DIAGNOSIS — Z3480 Encounter for supervision of other normal pregnancy, unspecified trimester: Secondary | ICD-10-CM

## 2019-05-18 DIAGNOSIS — O99891 Other specified diseases and conditions complicating pregnancy: Secondary | ICD-10-CM

## 2019-05-18 DIAGNOSIS — M9903 Segmental and somatic dysfunction of lumbar region: Secondary | ICD-10-CM

## 2019-05-18 DIAGNOSIS — Z3A35 35 weeks gestation of pregnancy: Secondary | ICD-10-CM

## 2019-05-18 DIAGNOSIS — M9904 Segmental and somatic dysfunction of sacral region: Secondary | ICD-10-CM | POA: Diagnosis not present

## 2019-05-18 DIAGNOSIS — M549 Dorsalgia, unspecified: Secondary | ICD-10-CM

## 2019-05-18 NOTE — Progress Notes (Signed)
   PRENATAL VISIT NOTE  Subjective:  Emily Cantu is a 24 y.o. G3P1011 at [redacted]w[redacted]d being seen today for ongoing prenatal care.  She is currently monitored for the following issues for this low-risk pregnancy and has Normal labor; Supervision of other normal pregnancy, antepartum; and Subchorionic hematoma in first trimester on their problem list.  Patient reports backache - better with OMT.  Contractions: Not present.  .  Movement: Present. Denies leaking of fluid.   The following portions of the patient's history were reviewed and updated as appropriate: allergies, current medications, past family history, past medical history, past social history, past surgical history and problem list. Problem list updated.  Objective:   Vitals:   05/18/19 1424  BP: 103/62  Pulse: 76    Fetal Status: Fetal Heart Rate (bpm): 140   Movement: Present     General:  Alert, oriented and cooperative. Patient is in no acute distress.  Skin: Skin is warm and dry. No rash noted.   Cardiovascular: Normal heart rate noted  Respiratory: Normal respiratory effort, no problems with respiration noted  Abdomen: Soft, gravid, appropriate for gestational age. Pain/Pressure: Present     Pelvic:  Cervical exam deferred        MSK: Restriction, tenderness, tissue texture changes, and paraspinal spasm in the lumbar spine  Neuro: Moves all four extremities with no focal neurological deficit  Extremities: Normal range of motion.  Edema: None  Mental Status: Normal mood and affect. Normal behavior. Normal judgment and thought content.   OSE: Head   Cervical   Thoracic   Rib   Lumbar L5 ESRR  Sacrum L/L  Pelvis Right ant innom.    Assessment and Plan:  Pregnancy: G3P1011 at [redacted]w[redacted]d  1. Supervision of other normal pregnancy, antepartum FHT and FH normal  2. Back pain affecting pregnancy in third trimester 3. Somatic dysfunction of lumbar region 4. Somatic dysfunction of sacral region 5. Somatic dysfunction of  pelvis region OMT done after patient permission. HVLA technique utilized. 3 areas treated with improvement of tissue texture and joint mobility. Patient tolerated procedure well.    Preterm labor symptoms and general obstetric precautions including but not limited to vaginal bleeding, contractions, leaking of fluid and fetal movement were reviewed in detail with the patient. Please refer to After Visit Summary for other counseling recommendations.  Return in about 2 weeks (around 06/01/2019).  Levie Heritage, DO

## 2019-05-22 ENCOUNTER — Telehealth: Payer: Self-pay

## 2019-05-22 NOTE — Telephone Encounter (Signed)
Pt called the office asking can we prescribe medication for dark spots on her face. Advised pt to check to see if she has regular Medicaid and if she does she can find out which Dermatologist accepts Medicaid.Pt made aware that she can discuss which creams to use at her next appt. Also advised pt to use natural Shea butter, cocoa butter, or Maderma. Understanding was voiced. Naithan Delage l Shandiin Eisenbeis, CMA

## 2019-06-01 ENCOUNTER — Other Ambulatory Visit: Payer: Self-pay

## 2019-06-01 ENCOUNTER — Ambulatory Visit (INDEPENDENT_AMBULATORY_CARE_PROVIDER_SITE_OTHER): Payer: Medicaid Other | Admitting: Family Medicine

## 2019-06-01 VITALS — BP 100/60 | HR 86 | Wt 154.1 lb

## 2019-06-01 DIAGNOSIS — Z3480 Encounter for supervision of other normal pregnancy, unspecified trimester: Secondary | ICD-10-CM

## 2019-06-01 DIAGNOSIS — Z3A35 35 weeks gestation of pregnancy: Secondary | ICD-10-CM

## 2019-06-01 DIAGNOSIS — Z3403 Encounter for supervision of normal first pregnancy, third trimester: Secondary | ICD-10-CM

## 2019-06-01 MED ORDER — FAMOTIDINE 20 MG PO TABS: 20.0000 mg | ORAL_TABLET | Freq: Two times a day (BID) | ORAL | 1 refills | Status: DC

## 2019-06-01 NOTE — Progress Notes (Signed)
   PRENATAL VISIT NOTE  Subjective:  Emily Cantu is a 24 y.o. G3P1011 at [redacted]w[redacted]d being seen today for ongoing prenatal care.  She is currently monitored for the following issues for this low-risk pregnancy and has Normal labor; Supervision of other normal pregnancy, antepartum; and Subchorionic hematoma in first trimester on their problem list.  Patient reports no complaints.  Contractions: Irregular. Vag. Bleeding: None.  Movement: Present. Denies leaking of fluid.   The following portions of the patient's history were reviewed and updated as appropriate: allergies, current medications, past family history, past medical history, past social history, past surgical history and problem list.   Objective:   Vitals:   06/01/19 1454  BP: 100/60  Pulse: 86  Weight: 154 lb 1.9 oz (69.9 kg)    Fetal Status: Fetal Heart Rate (bpm): 152   Movement: Present     General:  Alert, oriented and cooperative. Patient is in no acute distress.  Skin: Skin is warm and dry. No rash noted.   Cardiovascular: Normal heart rate noted  Respiratory: Normal respiratory effort, no problems with respiration noted  Abdomen: Soft, gravid, appropriate for gestational age.  Pain/Pressure: Present     Pelvic: Cervical exam deferred        Extremities: Normal range of motion.  Edema: None  Mental Status: Normal mood and affect. Normal behavior. Normal judgment and thought content.   Assessment and Plan:  Pregnancy: G3P1011 at [redacted]w[redacted]d  1. Supervision of other normal pregnancy, antepartum FHT and FH normal. Good fetal movement.  Preterm labor symptoms and general obstetric precautions including but not limited to vaginal bleeding, contractions, leaking of fluid and fetal movement were reviewed in detail with the patient. Please refer to After Visit Summary for other counseling recommendations.   No follow-ups on file.  Future Appointments  Date Time Provider Department Center  06/08/2019  8:15 AM Levie Heritage, DO [facility] None  06/15/2019  2:30 PM Levie Heritage, DO [facility] None    Levie Heritage, DO

## 2019-06-02 ENCOUNTER — Encounter: Payer: Medicaid Other | Admitting: Family Medicine

## 2019-06-08 ENCOUNTER — Other Ambulatory Visit (HOSPITAL_COMMUNITY)
Admission: RE | Admit: 2019-06-08 | Discharge: 2019-06-08 | Disposition: A | Discharge status: Home / Self Care | Payer: Medicaid Other | Source: Ambulatory Visit | Attending: Family Medicine | Admitting: Family Medicine

## 2019-06-08 ENCOUNTER — Ambulatory Visit (INDEPENDENT_AMBULATORY_CARE_PROVIDER_SITE_OTHER): Payer: Medicaid Other | Admitting: Family Medicine

## 2019-06-08 ENCOUNTER — Other Ambulatory Visit: Payer: Self-pay

## 2019-06-08 VITALS — BP 116/68 | HR 76 | Wt 158.0 lb

## 2019-06-08 DIAGNOSIS — Z3480 Encounter for supervision of other normal pregnancy, unspecified trimester: Secondary | ICD-10-CM

## 2019-06-08 DIAGNOSIS — O99891 Other specified diseases and conditions complicating pregnancy: Secondary | ICD-10-CM

## 2019-06-08 DIAGNOSIS — M9905 Segmental and somatic dysfunction of pelvic region: Secondary | ICD-10-CM | POA: Diagnosis not present

## 2019-06-08 DIAGNOSIS — M9904 Segmental and somatic dysfunction of sacral region: Secondary | ICD-10-CM

## 2019-06-08 DIAGNOSIS — M9903 Segmental and somatic dysfunction of lumbar region: Secondary | ICD-10-CM

## 2019-06-08 DIAGNOSIS — Z3A36 36 weeks gestation of pregnancy: Secondary | ICD-10-CM

## 2019-06-08 DIAGNOSIS — M549 Dorsalgia, unspecified: Secondary | ICD-10-CM

## 2019-06-08 NOTE — Progress Notes (Signed)
   PRENATAL VISIT NOTE  Subjective:  Emily Cantu is a 24 y.o. G3P1011 at [redacted]w[redacted]d being seen today for ongoing prenatal care.  She is currently monitored for the following issues for this low-risk pregnancy and has Normal labor; Supervision of other normal pregnancy, antepartum; and Subchorionic hematoma in first trimester on their problem list.  Patient reports right hip pain.  Contractions: Irregular.  .  Movement: Present. Denies leaking of fluid.   The following portions of the patient's history were reviewed and updated as appropriate: allergies, current medications, past family history, past medical history, past social history, past surgical history and problem list. Problem list updated.  Objective:   Vitals:   06/08/19 0829  BP: 116/68  Pulse: 76  Weight: 158 lb (71.7 kg)    Fetal Status: Fetal Heart Rate (bpm): 136 Fundal Height: 35 cm Movement: Present  Presentation: Vertex  General:  Alert, oriented and cooperative. Patient is in no acute distress.  Skin: Skin is warm and dry. No rash noted.   Cardiovascular: Normal heart rate noted  Respiratory: Normal respiratory effort, no problems with respiration noted  Abdomen: Soft, gravid, appropriate for gestational age. Pain/Pressure: Present     Pelvic:  Cervical exam deferred        MSK: Restriction, tenderness, tissue texture changes, and paraspinal spasm in the lumbar spine  Neuro: Moves all four extremities with no focal neurological deficit  Extremities: Normal range of motion.  Edema: None  Mental Status: Normal mood and affect. Normal behavior. Normal judgment and thought content.   OSE: Head   Cervical   Thoracic   Rib   Lumbar L5 ESRR  Sacrum L/L  Pelvis Right ant    Assessment and Plan:  Pregnancy: G3P1011 at [redacted]w[redacted]d  1. Supervision of other normal pregnancy, antepartum GBS, GC/CT collected. FH and FHT normal.  2. Back pain affecting pregnancy in third trimester 3. Somatic dysfunction of lumbar  region 4. Somatic dysfunction of pelvis region 5. Somatic dysfunction of sacral region OMT done after patient permission. HVLA technique utilized. 3 areas treated with improvement of tissue texture and joint mobility. Patient tolerated procedure well.     Preterm labor symptoms and general obstetric precautions including but not limited to vaginal bleeding, contractions, leaking of fluid and fetal movement were reviewed in detail with the patient. Please refer to After Visit Summary for other counseling recommendations.  No follow-ups on file.  Levie Heritage, DO

## 2019-06-09 ENCOUNTER — Encounter: Payer: Medicaid Other | Admitting: Family Medicine

## 2019-06-09 LAB — GC/CHLAMYDIA PROBE AMP (~~LOC~~) NOT AT ARMC
Chlamydia: POSITIVE — AB
Comment: NEGATIVE
Comment: NORMAL
Neisseria Gonorrhea: NEGATIVE

## 2019-06-12 ENCOUNTER — Telehealth: Payer: Self-pay

## 2019-06-12 LAB — CULTURE, BETA STREP (GROUP B ONLY): Strep Gp B Culture: POSITIVE — AB

## 2019-06-12 MED ORDER — AZITHROMYCIN 500 MG PO TABS: ORAL_TABLET | ORAL | 0 refills | Status: DC

## 2019-06-12 NOTE — Telephone Encounter (Signed)
Patient called and made aware that her gbs was positive and she will be on antibiotics during delivery.  Patient also made aware that chlamydia was positive. Patient needs to be treated now as she is 37 weeks. Advised patient of imporantance of refraining from intercourse with her partner until he is tested and/or treated. Patient states understanding. Again emphasized importance of getting this treated before baby arrives. Armandina Stammer RN

## 2019-06-15 ENCOUNTER — Encounter: Payer: Self-pay | Admitting: Family Medicine

## 2019-06-15 ENCOUNTER — Encounter: Payer: Medicaid Other | Admitting: Family Medicine

## 2019-06-15 ENCOUNTER — Other Ambulatory Visit: Payer: Self-pay

## 2019-06-15 ENCOUNTER — Ambulatory Visit (INDEPENDENT_AMBULATORY_CARE_PROVIDER_SITE_OTHER): Payer: Medicaid Other | Admitting: Family Medicine

## 2019-06-15 VITALS — BP 130/65 | HR 82 | Wt 159.0 lb

## 2019-06-15 DIAGNOSIS — Z3483 Encounter for supervision of other normal pregnancy, third trimester: Secondary | ICD-10-CM

## 2019-06-15 DIAGNOSIS — Z3480 Encounter for supervision of other normal pregnancy, unspecified trimester: Secondary | ICD-10-CM

## 2019-06-15 DIAGNOSIS — Z3A37 37 weeks gestation of pregnancy: Secondary | ICD-10-CM

## 2019-06-15 NOTE — Progress Notes (Signed)
   PRENATAL VISIT NOTE  Subjective:  Emily Cantu is a 24 y.o. G3P1011 at [redacted]w[redacted]d being seen today for ongoing prenatal care.  She is currently monitored for the following issues for this low-risk pregnancy and has Normal labor; Supervision of other normal pregnancy, antepartum; and Subchorionic hematoma in first trimester on their problem list.  Patient reports contraction.  Contractions: Irregular. Vag. Bleeding: None.  Movement: Present. Denies leaking of fluid.   The following portions of the patient's history were reviewed and updated as appropriate: allergies, current medications, past family history, past medical history, past social history, past surgical history and problem list.   Objective:   Vitals:   06/15/19 1008  BP: 130/65  Pulse: 82  Weight: 159 lb (72.1 kg)    Fetal Status: Fetal Heart Rate (bpm): 135 Fundal Height: 36 cm Movement: Present  Presentation: Vertex  General:  Alert, oriented and cooperative. Patient is in no acute distress.  Skin: Skin is warm and dry. No rash noted.   Cardiovascular: Normal heart rate noted  Respiratory: Normal respiratory effort, no problems with respiration noted  Abdomen: Soft, gravid, appropriate for gestational age.  Pain/Pressure: Present     Pelvic: Cervical exam performed in the presence of a chaperone Dilation: 1.5 Effacement (%): 70 Station: -3  Extremities: Normal range of motion.  Edema: None  Mental Status: Normal mood and affect. Normal behavior. Normal judgment and thought content.   Assessment and Plan:  Pregnancy: G3P1011 at [redacted]w[redacted]d  1. Supervision of other normal pregnancy, antepartum FHT and FH normal. Possible early labor.    Term labor symptoms and general obstetric precautions including but not limited to vaginal bleeding, contractions, leaking of fluid and fetal movement were reviewed in detail with the patient. Please refer to After Visit Summary for other counseling recommendations.   Return in about 1  week (around 06/22/2019) for OB f/u, In Office.  Future Appointments  Date Time Provider Department Center  06/23/2019  9:15 AM Willodean Rosenthal, MD [facility] None    Levie Heritage, DO

## 2019-06-15 NOTE — Progress Notes (Signed)
Patient complaining of vaginal pressure and feel like baby has dropped. Patient states she has been having contractions about 15 mins apart. Armandina Stammer RN

## 2019-06-16 ENCOUNTER — Encounter (HOSPITAL_COMMUNITY): Payer: Self-pay | Admitting: Obstetrics and Gynecology

## 2019-06-16 ENCOUNTER — Other Ambulatory Visit: Payer: Self-pay

## 2019-06-16 ENCOUNTER — Inpatient Hospital Stay (HOSPITAL_COMMUNITY)
Admission: AD | Admit: 2019-06-16 | Discharge: 2019-06-17 | Disposition: A | Discharge status: Home / Self Care | Payer: Medicaid Other | Source: Home / Self Care | Attending: Obstetrics and Gynecology | Admitting: Obstetrics and Gynecology

## 2019-06-16 DIAGNOSIS — Z3480 Encounter for supervision of other normal pregnancy, unspecified trimester: Secondary | ICD-10-CM

## 2019-06-16 DIAGNOSIS — Z3A38 38 weeks gestation of pregnancy: Secondary | ICD-10-CM | POA: Insufficient documentation

## 2019-06-16 DIAGNOSIS — O479 False labor, unspecified: Secondary | ICD-10-CM

## 2019-06-16 NOTE — MAU Note (Signed)
Contractions since late last night.  They are every 6 min apart. No leaking. No bleeding. 1.5 cm on Thursday office visit. Baby moving well.

## 2019-06-17 NOTE — MAU Provider Note (Signed)
Ms. Emily Cantu is a G3P1011 at [redacted]w[redacted]d seen in MAU for labor. RN labor check, not seen by provider.   SVE by RN Dilation: 1.5 Effacement (%): 50 Cervical Position: Posterior Station: -3 Presentation: Undeterminable Exam by:: benji stanley RN   NST - FHR: 135 bpm / moderate variability / accels present / decels absent / TOCO: regular every 7-8 mins   Plan:  D/C home with labor precautions Keep scheduled appt with [facility]  Sharen Counter, CNM  06/17/2019 1:38 AM

## 2019-06-17 NOTE — Discharge Instructions (Signed)
Things to Try After 37 weeks to Encourage Labor/Get Ready for Labor:   1.  Try the [employer] at [email or web address].[email or web address] daily to improve baby's position and encourage the onset of labor.  2. Walk a little and rest a little every day.  Change positions often.  3. Cervical Ripening: May try one or both a. Red Raspberry Leaf capsules or tea:  two 300mg  or 400mg  tablets with each meal, 2-3 times a day, or 1-3 cups of tea daily  Potential Side Effects Of Raspberry Leaf:  Most women do not experience any side effects from drinking raspberry leaf tea. However, nausea and loose stools are possible   b. Evening Primrose Oil capsules: may take 1 to 3 capsules daily. May also prick one to release the oil and insert it into your vagina at night.  Some of the potential side effects:  Upset stomach  Loose stools or diarrhea  Headaches  Nausea  4. Sex (and especially sex with orgasm) can also help the cervix ripen and encourage labor onset.   Reasons to return to MAU at [facility]:  1.  Contractions are  5 minutes apart or less, each last 1 minute, these have been going on for 1-2 hours, and you cannot walk or talk during them 2.  You have a large gush of fluid, or a trickle of fluid that will not stop and you have to wear a pad 3.  You have bleeding that is bright red, heavier than spotting--like menstrual bleeding (spotting can be normal in early labor or after a check of your cervix) 4.  You do not feel the baby moving like he/she normally does  Contractions of the uterus can occur throughout pregnancy, but they are not always a sign that you are in labor. You may have practice contractions called Braxton Hicks contractions. These false labor contractions are sometimes confused with true labor. What are [facility] contractions? Braxton Hicks contractions are tightening movements that occur in the muscles of the uterus before labor.  Unlike true labor contractions, these contractions do not result in opening (dilation) and thinning of the cervix. Toward the end of pregnancy (32-34 weeks), Braxton Hicks contractions can happen more often and may become stronger. These contractions are sometimes difficult to tell apart from true labor because they can be very uncomfortable. You should not feel embarrassed if you go to the hospital with false labor. Sometimes, the only way to tell if you are in true labor is for your health care provider to look for changes in the cervix. The health care provider will do a physical exam and may monitor your contractions. If you are not in true labor, the exam should show that your cervix is not dilating and your water has not broken. If there are no other health problems associated with your pregnancy, it is completely safe for you to be sent home with false labor. You may continue to have Braxton Hicks contractions until you go into true labor. How to tell the difference between true labor and false labor True labor  Contractions last 30-70 seconds.  Contractions become very regular.  Discomfort is usually felt in the top of the uterus, and it spreads to the lower abdomen and low back.  Contractions do not go away with walking.  Contractions usually become more intense and increase in frequency.  The cervix dilates and gets thinner. False labor  Contractions are usually shorter and not as strong  as true labor contractions.  Contractions are usually irregular.  Contractions are often felt in the front of the lower abdomen and in the groin.  Contractions may go away when you walk around or change positions while lying down.  Contractions get weaker and are shorter-lasting as time goes on.  The cervix usually does not dilate or become thin. Follow these instructions at home:   Take over-the-counter and prescription medicines only as told by your health care provider.  Keep up with  your usual exercises and follow other instructions from your health care provider.  Eat and drink lightly if you think you are going into labor.  If Braxton Hicks contractions are making you uncomfortable: ? Change your position from lying down or resting to walking, or change from walking to resting. ? Sit and rest in a tub of warm water. ? Drink enough fluid to keep your urine pale yellow. Dehydration may cause these contractions. ? Do slow and deep breathing several times an hour.  Keep all follow-up prenatal visits as told by your health care provider. This is important. Contact a health care provider if:  You have a fever.  You have continuous pain in your abdomen. Get help right away if:  Your contractions become stronger, more regular, and closer together.  You have fluid leaking or gushing from your vagina.  You pass blood-tinged mucus (bloody show).  You have bleeding from your vagina.  You have low back pain that you never had before.  You feel your baby's head pushing down and causing pelvic pressure.  Your baby is not moving inside you as much as it used to. Summary  Contractions that occur before labor are called Braxton Hicks contractions, false labor, or practice contractions.  Braxton Hicks contractions are usually shorter, weaker, farther apart, and less regular than true labor contractions. True labor contractions usually become progressively stronger and regular, and they become more frequent.  Manage discomfort from [facility] contractions by changing position, resting in a warm bath, drinking plenty of water, or practicing deep breathing. This information is not intended to replace advice given to you by your health care provider. Make sure you discuss any questions you have with your health care provider. Document Revised: 01/22/2017 Document Reviewed: 06/25/2016 Elsevier Patient Education  [place].

## 2019-06-17 NOTE — MAU Note (Signed)
I have communicated with Sharen Counter CNM and reviewed vital signs:  Vitals:   06/16/19 2350 06/17/19 0143  BP: (!) 115/48 (!) 116/55  Pulse: 83 71  Resp: 16 16  Temp: 98.5 F (36.9 C) 97.8 F (36.6 C)  SpO2: 100%     Vaginal exam:  Dilation: 1.5 Effacement (%): 50 Cervical Position: Posterior Station: -3 Presentation: Undeterminable Exam by:: Trevione Wert Forensic scientist,   Also reviewed contraction pattern and that non-stress test is reactive.  It has been documented that patient is contracting every 7  minutes with no cervical change over 1.5 hours not indicating active labor.  Patient denies any other complaints.  Based on this report provider has given order for discharge.  A discharge order and diagnosis entered by a provider.   Labor discharge instructions reviewed with patient.

## 2019-06-18 ENCOUNTER — Inpatient Hospital Stay (HOSPITAL_COMMUNITY): Payer: Medicaid Other | Admitting: Anesthesiology

## 2019-06-18 ENCOUNTER — Other Ambulatory Visit: Payer: Self-pay

## 2019-06-18 ENCOUNTER — Inpatient Hospital Stay (HOSPITAL_COMMUNITY)
Admission: AD | Admit: 2019-06-18 | Discharge: 2019-06-20 | DRG: 807 | Disposition: A | Discharge status: Home / Self Care | Payer: Medicaid Other | Attending: Obstetrics and Gynecology | Admitting: Obstetrics and Gynecology

## 2019-06-18 ENCOUNTER — Encounter (HOSPITAL_COMMUNITY): Payer: Self-pay | Admitting: Obstetrics and Gynecology

## 2019-06-18 DIAGNOSIS — Z3480 Encounter for supervision of other normal pregnancy, unspecified trimester: Secondary | ICD-10-CM

## 2019-06-18 DIAGNOSIS — O99824 Streptococcus B carrier state complicating childbirth: Secondary | ICD-10-CM | POA: Diagnosis present

## 2019-06-18 DIAGNOSIS — Z3A38 38 weeks gestation of pregnancy: Secondary | ICD-10-CM | POA: Diagnosis not present

## 2019-06-18 DIAGNOSIS — A749 Chlamydial infection, unspecified: Secondary | ICD-10-CM

## 2019-06-18 DIAGNOSIS — O26893 Other specified pregnancy related conditions, third trimester: Secondary | ICD-10-CM | POA: Diagnosis present

## 2019-06-18 DIAGNOSIS — O98813 Other maternal infectious and parasitic diseases complicating pregnancy, third trimester: Secondary | ICD-10-CM

## 2019-06-18 DIAGNOSIS — Z20822 Contact with and (suspected) exposure to covid-19: Secondary | ICD-10-CM | POA: Diagnosis present

## 2019-06-18 HISTORY — DX: Other maternal infectious and parasitic diseases complicating pregnancy, third trimester: O98.813

## 2019-06-18 HISTORY — DX: Other specified diseases and conditions complicating pregnancy: O99.891

## 2019-06-18 HISTORY — DX: Chlamydial infection, unspecified: A74.9

## 2019-06-18 HISTORY — DX: Scoliosis, unspecified: M41.9

## 2019-06-18 HISTORY — DX: Dorsalgia, unspecified: M54.9

## 2019-06-18 HISTORY — DX: Other specified disorders of amniotic fluid and membranes, unspecified trimester, not applicable or unspecified: O41.8X90

## 2019-06-18 HISTORY — DX: Other specified disorders of amniotic fluid and membranes, unspecified trimester, not applicable or unspecified: O46.8X9

## 2019-06-18 LAB — TYPE AND SCREEN
ABO/RH(D): O POS
Antibody Screen: NEGATIVE

## 2019-06-18 LAB — CBC
HCT: 41.5 % (ref 36.0–46.0)
Hemoglobin: 14.1 g/dL (ref 12.0–15.0)
MCH: 30.4 pg (ref 26.0–34.0)
MCHC: 34 g/dL (ref 30.0–36.0)
MCV: 89.4 fL (ref 80.0–100.0)
Platelets: 213 10*3/uL (ref 150–400)
RBC: 4.64 MIL/uL (ref 3.87–5.11)
RDW: 13.6 % (ref 11.5–15.5)
WBC: 7.9 10*3/uL (ref 4.0–10.5)
nRBC: 0 % (ref 0.0–0.2)

## 2019-06-18 LAB — POCT FERN TEST: POCT Fern Test: POSITIVE

## 2019-06-18 LAB — RESPIRATORY PANEL BY RT PCR (FLU A&B, COVID)
Influenza A by PCR: NEGATIVE
Influenza B by PCR: NEGATIVE
SARS Coronavirus 2 by RT PCR: NEGATIVE

## 2019-06-18 LAB — ABO/RH: ABO/RH(D): O POS

## 2019-06-18 MED ORDER — DIBUCAINE (PERIANAL) 1 % EX OINT: 1.0000 "application " | TOPICAL_OINTMENT | CUTANEOUS | Status: DC | PRN

## 2019-06-18 MED ORDER — LACTATED RINGERS IV SOLN: INTRAVENOUS | Status: DC

## 2019-06-18 MED ORDER — SENNOSIDES-DOCUSATE SODIUM 8.6-50 MG PO TABS
2.0000 | ORAL_TABLET | ORAL | Status: DC
  Administered 2019-06-18 – 2019-06-19 (×2): 2 via ORAL
  Filled 2019-06-18 (×2): qty 2

## 2019-06-18 MED ORDER — ACETAMINOPHEN 325 MG PO TABS: 650.0000 mg | ORAL_TABLET | ORAL | Status: DC | PRN

## 2019-06-18 MED ORDER — OXYCODONE-ACETAMINOPHEN 5-325 MG PO TABS: 1.0000 | ORAL_TABLET | ORAL | Status: DC | PRN

## 2019-06-18 MED ORDER — ONDANSETRON HCL 4 MG/2ML IJ SOLN: 4.0000 mg | INTRAMUSCULAR | Status: DC | PRN

## 2019-06-18 MED ORDER — IBUPROFEN 600 MG PO TABS
600.0000 mg | ORAL_TABLET | Freq: Four times a day (QID) | ORAL | Status: DC
  Administered 2019-06-18 – 2019-06-20 (×7): 600 mg via ORAL
  Filled 2019-06-18 (×8): qty 1

## 2019-06-18 MED ORDER — OXYTOCIN BOLUS FROM INFUSION
500.0000 mL | Freq: Once | INTRAVENOUS | Status: AC
  Administered 2019-06-18: 18:00:00 500 mL via INTRAVENOUS

## 2019-06-18 MED ORDER — SODIUM CHLORIDE (PF) 0.9 % IJ SOLN
INTRAMUSCULAR | Status: DC | PRN
  Administered 2019-06-18: 12 mL/h via EPIDURAL

## 2019-06-18 MED ORDER — PHENYLEPHRINE 40 MCG/ML (10ML) SYRINGE FOR IV PUSH (FOR BLOOD PRESSURE SUPPORT): 80.0000 ug | PREFILLED_SYRINGE | INTRAVENOUS | Status: DC | PRN

## 2019-06-18 MED ORDER — TETANUS-DIPHTH-ACELL PERTUSSIS 5-2.5-18.5 LF-MCG/0.5 IM SUSP: 0.5000 mL | Freq: Once | INTRAMUSCULAR | Status: DC

## 2019-06-18 MED ORDER — ZOLPIDEM TARTRATE 5 MG PO TABS: 5.0000 mg | ORAL_TABLET | Freq: Every evening | ORAL | Status: DC | PRN

## 2019-06-18 MED ORDER — ONDANSETRON HCL 4 MG PO TABS: 4.0000 mg | ORAL_TABLET | ORAL | Status: DC | PRN

## 2019-06-18 MED ORDER — TERBUTALINE SULFATE 1 MG/ML IJ SOLN: 0.2500 mg | Freq: Once | INTRAMUSCULAR | Status: DC | PRN

## 2019-06-18 MED ORDER — FENTANYL CITRATE (PF) 100 MCG/2ML IJ SOLN: 100.0000 ug | INTRAMUSCULAR | Status: DC | PRN

## 2019-06-18 MED ORDER — EPHEDRINE 5 MG/ML INJ: 10.0000 mg | INTRAVENOUS | Status: DC | PRN

## 2019-06-18 MED ORDER — PRENATAL MULTIVITAMIN CH
1.0000 | ORAL_TABLET | Freq: Every day | ORAL | Status: DC
  Administered 2019-06-19: 12:00:00 1 via ORAL
  Filled 2019-06-18 (×2): qty 1

## 2019-06-18 MED ORDER — OXYCODONE-ACETAMINOPHEN 5-325 MG PO TABS: 2.0000 | ORAL_TABLET | ORAL | Status: DC | PRN

## 2019-06-18 MED ORDER — WITCH HAZEL-GLYCERIN EX PADS: 1.0000 "application " | MEDICATED_PAD | CUTANEOUS | Status: DC | PRN

## 2019-06-18 MED ORDER — SODIUM CHLORIDE 0.9 % IV SOLN
5.0000 10*6.[IU] | Freq: Once | INTRAVENOUS | Status: AC
  Administered 2019-06-18: 5 10*6.[IU] via INTRAVENOUS
  Filled 2019-06-18: qty 5

## 2019-06-18 MED ORDER — SOD CITRATE-CITRIC ACID 500-334 MG/5ML PO SOLN: 30.0000 mL | ORAL | Status: DC | PRN

## 2019-06-18 MED ORDER — PHENYLEPHRINE 40 MCG/ML (10ML) SYRINGE FOR IV PUSH (FOR BLOOD PRESSURE SUPPORT)
80.0000 ug | PREFILLED_SYRINGE | INTRAVENOUS | Status: DC | PRN
  Filled 2019-06-18: qty 10

## 2019-06-18 MED ORDER — ACETAMINOPHEN 325 MG PO TABS
650.0000 mg | ORAL_TABLET | ORAL | Status: DC | PRN
  Administered 2019-06-19: 09:00:00 650 mg via ORAL
  Filled 2019-06-18: qty 2

## 2019-06-18 MED ORDER — BENZOCAINE-MENTHOL 20-0.5 % EX AERO
1.0000 "application " | INHALATION_SPRAY | CUTANEOUS | Status: DC | PRN
  Administered 2019-06-19: 1 via TOPICAL
  Filled 2019-06-18: qty 56

## 2019-06-18 MED ORDER — LACTATED RINGERS IV SOLN: 500.0000 mL | INTRAVENOUS | Status: DC | PRN

## 2019-06-18 MED ORDER — OXYTOCIN 40 UNITS IN NORMAL SALINE INFUSION - SIMPLE MED
2.5000 [IU]/h | INTRAVENOUS | Status: DC
  Administered 2019-06-18: 18:00:00 2.5 [IU]/h via INTRAVENOUS
  Filled 2019-06-18: qty 1000

## 2019-06-18 MED ORDER — SIMETHICONE 80 MG PO CHEW: 80.0000 mg | CHEWABLE_TABLET | ORAL | Status: DC | PRN

## 2019-06-18 MED ORDER — COCONUT OIL OIL: 1.0000 "application " | TOPICAL_OIL | Status: DC | PRN

## 2019-06-18 MED ORDER — OXYTOCIN 40 UNITS IN NORMAL SALINE INFUSION - SIMPLE MED
1.0000 m[IU]/min | INTRAVENOUS | Status: DC
  Administered 2019-06-18: 2 m[IU]/min via INTRAVENOUS

## 2019-06-18 MED ORDER — DIPHENHYDRAMINE HCL 25 MG PO CAPS: 25.0000 mg | ORAL_CAPSULE | Freq: Four times a day (QID) | ORAL | Status: DC | PRN

## 2019-06-18 MED ORDER — FENTANYL-BUPIVACAINE-NACL 0.5-0.125-0.9 MG/250ML-% EP SOLN
12.0000 mL/h | EPIDURAL | Status: DC | PRN
  Filled 2019-06-18: qty 250

## 2019-06-18 MED ORDER — FENTANYL CITRATE (PF) 100 MCG/2ML IJ SOLN
INTRAMUSCULAR | Status: AC
  Administered 2019-06-18: 10:00:00 100 ug via INTRAVENOUS
  Filled 2019-06-18: qty 2

## 2019-06-18 MED ORDER — LIDOCAINE HCL (PF) 1 % IJ SOLN: 30.0000 mL | INTRAMUSCULAR | Status: DC | PRN

## 2019-06-18 MED ORDER — DIPHENHYDRAMINE HCL 50 MG/ML IJ SOLN: 12.5000 mg | INTRAMUSCULAR | Status: DC | PRN

## 2019-06-18 MED ORDER — SODIUM CHLORIDE 0.9 % IV SOLN
2.0000 g | Freq: Once | INTRAVENOUS | Status: AC
  Administered 2019-06-18: 2 g via INTRAVENOUS
  Filled 2019-06-18: qty 2000

## 2019-06-18 MED ORDER — LACTATED RINGERS IV SOLN
500.0000 mL | Freq: Once | INTRAVENOUS | Status: AC
  Administered 2019-06-18: 10:00:00 500 mL via INTRAVENOUS

## 2019-06-18 MED ORDER — ONDANSETRON HCL 4 MG/2ML IJ SOLN: 4.0000 mg | Freq: Four times a day (QID) | INTRAMUSCULAR | Status: DC | PRN

## 2019-06-18 MED ORDER — PENICILLIN G POT IN DEXTROSE 60000 UNIT/ML IV SOLN
3.0000 10*6.[IU] | INTRAVENOUS | Status: DC
  Administered 2019-06-18: 14:00:00 3 10*6.[IU] via INTRAVENOUS
  Filled 2019-06-18 (×5): qty 50

## 2019-06-18 MED ORDER — LIDOCAINE HCL (PF) 1 % IJ SOLN
INTRAMUSCULAR | Status: DC | PRN
  Administered 2019-06-18: 2 mL via EPIDURAL
  Administered 2019-06-18: 10 mL via EPIDURAL

## 2019-06-18 NOTE — H&P (Signed)
Emily Cantu is a 24 y.o. female G3P1011 with IUP at 29w1dpresenting for SROM and contractions.  Pt states she has been having irregular, every  Few  minutes contractions, associated with none vaginal bleeding for no hours..  Membranes are ruptured, with active fetal movement. SROM this morning at 0800 with clear fluid, positive fern in MAU.    [facility]  Prenatal History/Complications:  Past Medical History: Past Medical History:  Diagnosis Date  . Abortion   . Back pain affecting pregnancy   . Depression   . Subchorionic hematoma     Past Surgical History: Past Surgical History:  Procedure Laterality Date  . NO PAST SURGERIES    . THERAPEUTIC ABORTION  05/28/2018    Obstetrical History: OB History    Gravida  3   Para  1   Term  1   Preterm      AB  1   Living  1     SAB      TAB  1   Ectopic      Multiple  0   Live Births  1            Social History: Social History   Socioeconomic History  . Marital status: Single    Spouse name: Not on file  . Number of children: 1  . Years of education: Not on file  . Highest education level: Not on file  Occupational History  . Not on file  Tobacco Use  . Smoking status: Never Smoker  . Smokeless tobacco: Never Used  Substance and Sexual Activity  . Alcohol use: No  . Drug use: No  . Sexual activity: Yes    Birth control/protection: None  Other Topics Concern  . Not on file  Social History Narrative  . Not on file   Social Determinants of Health   Financial Resource Strain:   . Difficulty of Paying Living Expenses:   Food Insecurity:   . Worried About RCharity fundraiserin the Last Year:   . RArboriculturistin the Last Year:   Transportation Needs:   . LFilm/video editor(Medical):   .Marland KitchenLack of Transportation (Non-Medical):   Physical Activity:   . Days of Exercise per Week:   . Minutes of Exercise per Session:   Stress:   . Feeling of Stress :    Social Connections:   . Frequency of Communication with Friends and Family:   . Frequency of Social Gatherings with Friends and Family:   . Attends Religious Services:   . Active Member of Clubs or Organizations:   . Attends CArchivistMeetings:   .Marland KitchenMarital Status:     Family History: Family History  Problem Relation Age of Onset  . Hypertension Mother   . Cancer Father   . Hypertension Maternal Grandmother     Allergies: No Known Allergies  Medications Prior to Admission  Medication Sig Dispense Refill Last Dose  . AMBULATORY NON FORMULARY MEDICATION 1 Device by Other route once a week. Blood pressure cuff/Medium Monitored Regularly at home. ICD 10: Z34.90 LROB (Patient not taking: Reported on 06/15/2019) 1 kit 0   . azithromycin (ZITHROMAX) 500 MG tablet Take 1,0021m(2 tablets) once by mouth. 2 tablet 0   . cyclobenzaprine (FLEXERIL) 10 MG tablet Take 1 tablet (10 mg total) by mouth 3 (three) times daily as needed for muscle spasms. 30 tablet 2 06/18/2019  at 0100  . famotidine (PEPCID) 20 MG tablet Take 1 tablet (20 mg total) by mouth 2 (two) times daily. (Patient not taking: Reported on 06/15/2019) 60 tablet 1   . Prenatal Vit-Fe Fumarate-FA (PRENATAL VITAMINS PO) Take by mouth.           Review of Systems   Constitutional: Negative for fever and chills Eyes: Negative for visual disturbances Respiratory: Negative for shortness of breath, dyspnea Cardiovascular: Negative for chest pain or palpitations  Gastrointestinal: Negative for vomiting, diarrhea and constipation.  POSITIVE for abdominal pain (contractions) Genitourinary: Negative for dysuria and urgency Musculoskeletal: Negative for back pain, joint pain, myalgias  Neurological: Negative for dizziness and headaches      Blood pressure 135/78, pulse 79, temperature 98.8 F (37.1 C), temperature source Oral, resp. rate 18, height 5' 4"  (1.626 m), weight 73 kg, last menstrual period 09/24/2018, SpO2  100 %. General appearance: alert, cooperative and no distress Lungs: normal respiratory effort Heart: regular rate and rhythm Abdomen: soft, non-tender; bowel sounds normal Extremities: Homans sign is negative, no sign of DVT DTR's 2+ Presentation: cephalic Fetal monitoring  Baseline: 135 bpm, mod var, present acel, neg decels,  Uterine activity  Contractions q 2-4 min Dilation: 4.5 Effacement (%): 70 Station: -2 Exam by:: robinson rn   Prenatal labs: ABO, Rh: --/--/PENDING (04/25 2992) Antibody: PENDING (04/25 0905) Rubella: 1.48 (09/08 1031) RPR: Non Reactive (01/22 0849)  HBsAg: Negative (09/08 1031)  HIV: Non Reactive (01/22 0849)  GBS: Positive/-- (04/15 0849)  1 hr Glucola 66, 46, 74 Genetic screening  negative Anatomy US normal  Prenatal Transfer Tool  Maternal Diabetes: No Genetic Screening: Normal Maternal Ultrasounds/Referrals: Normal Fetal Ultrasounds or other Referrals:  None Maternal Substance Abuse:  No Significant Maternal Medications:  None Significant Maternal Lab Results: None and Group B Strep positive     Results for orders placed or performed during the hospital encounter of 06/18/19 (from the past 24 hour(s))  POCT fern test   Collection Time: 06/18/19  8:53 AM  Result Value Ref Range   POCT Fern Test Positive = ruptured amniotic membanes   Respiratory Panel by RT PCR (Flu A&B, Covid) - Nasopharyngeal Swab   Collection Time: 06/18/19  9:04 AM   Specimen: Nasopharyngeal Swab  Result Value Ref Range   SARS Coronavirus 2 by RT PCR NEGATIVE NEGATIVE   Influenza A by PCR NEGATIVE NEGATIVE   Influenza B by PCR NEGATIVE NEGATIVE  CBC   Collection Time: 06/18/19  9:04 AM  Result Value Ref Range   WBC 7.9 4.0 - 10.5 K/uL   RBC 4.64 3.87 - 5.11 MIL/uL   Hemoglobin 14.1 12.0 - 15.0 g/dL   HCT 41.5 36.0 - 46.0 %   MCV 89.4 80.0 - 100.0 fL   MCH 30.4 26.0 - 34.0 pg   MCHC 34.0 30.0 - 36.0 g/dL   RDW 13.6 11.5 - 15.5 %   Platelets 213 150 - 400  K/uL   nRBC 0.0 0.0 - 0.2 %  Type and screen [place]   Collection Time: 06/18/19  9:05 AM  Result Value Ref Range   ABO/RH(D) PENDING    Antibody Screen PENDING    Sample Expiration      06/21/2019,2359 Performed at [facility], [place] [address], [place], [place] [address]     Assessment: Emily Cantu is a 24 y.o. G3P1011 with an IUP at 32w1dpresenting for SROM and SOL  Plan: #Labor: expectant management #Pain:  Per  request #FWB Cat 1 #ID: GBS: pos-will be treated with PCN  #MOF:  breast #MOC: POP #Circ: yes   Mervyn Skeeters Jsoeph Podesta 06/18/2019, 10:09 AM

## 2019-06-18 NOTE — MAU Note (Signed)
Emily Cantu is a 24 y.o. at [redacted]w[redacted]d here in MAU reporting: LOF for the past 30 minutes, states it has been clear. Worsening contractions since previous MAU visit. States contractions every 3 minutes. No bleeding. Some FM.  Onset of complaint: ongoing  Pain score: 10/10  Vitals:   06/18/19 0853  BP: 133/81  Pulse: 100  Resp: 20  Temp: 100 F (37.8 C)  SpO2: 98%     FHT: EFM applied in the room   Lab orders placed from triage: fern slide

## 2019-06-18 NOTE — Progress Notes (Signed)
   Emily Cantu is a 24 y.o. G3P1011 at [redacted]w[redacted]d  admitted for active labor and SROM.   Subjective: Coping well; comfortable with epidural; says that she feels more leaking of fluid.   Objective: Vitals:   06/18/19 1300 06/18/19 1330 06/18/19 1400 06/18/19 1430  BP: 106/73 111/66 109/66 108/65  Pulse: 95 82 90 81  Resp: 16 16 16 16   Temp: 98.8 F (37.1 C)     TempSrc: Oral     SpO2:      Weight:      Height:       Total I/O In: 1738.6 [P.O.:240; I.V.:1148.6; IV Piggyback:350] Out: 500 [Urine:500]  FHT:  FHR: 135 bpm, variability: moderate,  accelerations:  Present,  decelerations:  Present occasional variables, occasional late decelerations but none prolonged and still with moderate variability.  UC:   irregular, every 2-3 minutes SVE:   Dilation: 7.5 Effacement (%): 80 Station: -1, 0 Exam by:: [email or web address], RN  Pitocin @ 2 mu/min  Labs: Lab Results  Component Value Date   WBC 7.9 06/18/2019   HGB 14.1 06/18/2019   HCT 41.5 06/18/2019   MCV 89.4 06/18/2019   PLT 213 06/18/2019    Assessment / Plan: Augmentation of labor, progressing well  Labor: will start pitocin as patient has not made progress from earlier exam Fetal Wellbeing:  Category II; continue to monitor and change position as needed. DC pitocin if FHR concerning.  Pain Control:  Epidural Anticipated MOD:  NSVD  06/20/2019 [facility] 06/18/2019, 2:49 PM

## 2019-06-18 NOTE — Anesthesia Procedure Notes (Signed)
Epidural Patient location during procedure: OB Start time: 06/18/2019 10:36 AM End time: 06/18/2019 10:47 AM  Staffing Anesthesiologist: Lannie Fields, DO Performed: anesthesiologist   Preanesthetic Checklist Completed: patient identified, IV checked, risks and benefits discussed, monitors and equipment checked, pre-op evaluation and timeout performed  Epidural Patient position: sitting Prep: DuraPrep and site prepped and draped Patient monitoring: continuous pulse ox, blood pressure, heart rate and cardiac monitor Approach: midline Location: L3-L4 Injection technique: LOR air  Needle:  Needle type: Tuohy  Needle gauge: 17 G Needle length: 9 cm Needle insertion depth: 4.5 cm Catheter type: closed end flexible Catheter size: 19 Gauge Catheter at skin depth: 10 cm Test dose: negative  Assessment Sensory level: T8 Events: blood not aspirated, injection not painful, no injection resistance, no paresthesia and negative IV test  Additional Notes Patient identified. Risks/Benefits/Options discussed with patient including but not limited to bleeding, infection, nerve damage, paralysis, failed block, incomplete pain control, headache, blood pressure changes, nausea, vomiting, reactions to medication both or allergic, itching and postpartum back pain. Confirmed with bedside nurse the patient's most recent platelet count. Confirmed with patient that they are not currently taking any anticoagulation, have any bleeding history or any family history of bleeding disorders. Patient expressed understanding and wished to proceed. All questions were answered. Sterile technique was used throughout the entire procedure. Please see nursing notes for vital signs. Test dose was given through epidural catheter and negative prior to continuing to dose epidural or start infusion. Warning signs of high block given to the patient including shortness of breath, tingling/numbness in hands, complete motor  block, or any concerning symptoms with instructions to call for help. Patient was given instructions on fall risk and not to get out of bed. All questions and concerns addressed with instructions to call with any issues or inadequate analgesia.  Reason for block:procedure for pain

## 2019-06-18 NOTE — Discharge Summary (Signed)
Postpartum Discharge Summary     Patient Name: Emily Cantu DOB: 1995-12-04 MRN: [id number]  Date of admission: 06/18/2019 Delivering Provider: Starr Lake   Date of discharge: 06/20/2019  Admitting diagnosis: Indication for care in labor and delivery, antepartum [O75.9] Intrauterine pregnancy: [redacted]w[redacted]d    Secondary diagnosis:  Active Problems:   Vaginal delivery   Chlamydia infection affecting pregnancy in third trimester  Additional problems: NA     Discharge diagnosis: Term Pregnancy Delivered                                                                                                Post partum procedures:NA  Augmentation: Pitocin  Complications: None  Hospital course:  Onset of Labor With Vaginal Delivery     24y.o. yo G3P1011 at 310w1das admitted in Active Labor on 06/18/2019. Patient had an uncomplicated labor course as follows: She arrived at 4-5 cm and had positive fern in MAU. After epidural she made slow cervical change so pitocin started. Patient progressed quickly to complete and had a short 2nd stage without any vaginal or perineal lacerations.  Membrane Rupture Time/Date: 8:00 AM ,06/18/2019   Intrapartum Procedures: Episiotomy: None [1]                                         Lacerations:  None [1]  Patient had a delivery of a Viable infant. 06/18/2019  Information for the patient's newborn:  VeDannette, Kinkaid0[[id number]]Delivery Method: Vag-Spont     Pateint had an uncomplicated postpartum course. [facility] She is ambulating, tolerating a regular diet, passing flatus, and urinating well. Patient is discharged home in stable condition on 06/20/19.  Delivery time: 5:27 PM    Magnesium Sulfate received: No BMZ received: No Rhophylac:N/A MMR:No Transfusion:No  Physical exam  Vitals:   06/19/19 0454 06/19/19 1450 06/19/19 1941 06/20/19 0531  BP: 106/61 114/64 115/69 126/70  Pulse: 72 89 70  80  Resp: 18 16 15 18   Temp: 98.1 F (36.7 C) 97.9 F (36.6 C) 98 F (36.7 C) 98.4 F (36.9 C)  TempSrc: Oral Oral Oral Oral  SpO2: 98% 100% 100% 100%  Weight:      Height:       General: alert, cooperative and no distress Lochia: appropriate Uterine Fundus: firm Incision: N/A DVT Evaluation: No evidence of DVT seen on physical exam. Labs: Lab Results  Component Value Date   WBC 7.9 06/18/2019   HGB 14.1 06/18/2019   HCT 41.5 06/18/2019   MCV 89.4 06/18/2019   PLT 213 06/18/2019   CMP Latest Ref Rng & Units 07/15/2018  Glucose 70 - 99 mg/dL 87  BUN 6 - 20 mg/dL 15  Creatinine 0.44 - 1.00 mg/dL 0.70  Sodium 135 - 145 mmol/L 140  Potassium 3.5 - 5.1 mmol/L 4.2  Chloride 98 - 111 mmol/L 107  CO2 22 - 32 mmol/L 24  Calcium 8.9 - 10.3 mg/dL 9.7  Total Protein 6.5 - 8.1 g/dL 6.8  Total Bilirubin 0.3 - 1.2 mg/dL 0.6  Alkaline Phos 38 - 126 U/L 62  AST 15 - 41 U/L 17  ALT 0 - 44 U/L 12   Edinburgh Score: Edinburgh Postnatal Depression Scale Screening Tool 06/19/2019  I have been able to laugh and see the funny side of things. 0  I have looked forward with enjoyment to things. 0  I have blamed myself unnecessarily when things went wrong. 1  I have been anxious or worried for no good reason. 2  I have felt scared or panicky for no good reason. 2  Things have been getting on top of me. 1  I have been so unhappy that I have had difficulty sleeping. 1  I have felt sad or miserable. 0  I have been so unhappy that I have been crying. 0  The thought of harming myself has occurred to me. 0  Edinburgh Postnatal Depression Scale Total 7    Discharge instruction: per After Visit Summary and "Baby and Me Booklet".  After visit meds:  Allergies as of 06/20/2019   No Known Allergies     Medication List    STOP taking these medications   azithromycin 500 MG tablet Commonly known as: Zithromax   cyclobenzaprine 10 MG tablet Commonly known as: FLEXERIL   famotidine 20 MG  tablet Commonly known as: PEPCID     TAKE these medications   acetaminophen 325 MG tablet Commonly known as: Tylenol Take 2 tablets (650 mg total) by mouth every 6 (six) hours as needed (for pain scale < 4).   AMBULATORY NON FORMULARY MEDICATION 1 Device by Other route once a week. Blood pressure cuff/Medium Monitored Regularly at home. ICD 10: Z34.90 LROB   ibuprofen 600 MG tablet Commonly known as: ADVIL Take 1 tablet (600 mg total) by mouth every 6 (six) hours.   norethindrone 0.35 MG tablet Commonly known as: MICRONOR Take 1 tablet (0.35 mg total) by mouth daily.   PRENATAL VITAMINS PO Take by mouth.   senna-docusate 8.6-50 MG tablet Commonly known as: Senokot-S Take 2 tablets by mouth daily. Start taking on: June 21, 2019       Diet: routine diet  Activity: Advance as tolerated. Pelvic rest for 6 weeks.   Outpatient follow up:4 weeks Follow up Appt: Future Appointments  Date Time Provider [place]  07/13/2019 10:45 AM Truett Mainland, DO [facility] None   Follow up Visit:    Please schedule this patient for Postpartum visit in: 6 weeks with the following provider: Any provider Virtual For C/S patients schedule nurse incision check in weeks 2 weeks: no Low risk pregnancy complicated by: NA Delivery mode:  SVD Anticipated Birth Control:  POPs PP Procedures needed: NA  Schedule Integrated BH visit: no     Newborn Data: Live born female  Birth Weight: 2951g  APGAR: 47, 9  Newborn Delivery   Birth date/time: 06/18/2019 17:27:00 Delivery type: Vaginal, Spontaneous      Baby Feeding: Breast Disposition:home with mother   06/20/2019 Chauncey Mann, MD

## 2019-06-18 NOTE — Lactation Note (Signed)
This note was copied from a baby's chart. Lactation Consultation Note Baby 6 hrs old. Mom stated baby is latching well. Mom has elongated everted nipples. Hand expression w/no colostrum noted. Mom demonstrated hand expression. Breast tissue soft.  Educated on the importance of I&O documentation.  Newborn behavior, STS, breast massage, positioning, support, supply and demand discussed. Mom encouraged to feed baby 8-12 times/24 hours and with feeding cues. Mom encouraged to waken baby for feedings if hasn't cued in 3 hrs.  Latched in football position well. Mom denies painful latch. No swallows heard. Encouraged occasional breast massage.  Encouraged mom to call for assistance or questions.  Lactation brochure given.  Patient Name: Emily Cantu [id number] Date: 06/18/2019 Reason for consult: Initial assessment;Primapara;Early term 37-38.6wks   Maternal Data Has patient been taught Hand Expression?: Yes Does the patient have breastfeeding experience prior to this delivery?: No  Feeding Feeding Type: Breast Fed  LATCH Score Latch: Grasps breast easily, tongue down, lips flanged, rhythmical sucking.  Audible Swallowing: None  Type of Nipple: Everted at rest and after stimulation  Comfort (Breast/Nipple): Soft / non-tender  Hold (Positioning): Assistance needed to correctly position infant at breast and maintain latch.  LATCH Score: 7  Interventions Interventions: Breast feeding basics reviewed;Support pillows;Assisted with latch;Position options;Skin to skin;Breast massage;Hand express;Breast compression;Adjust position  Lactation Tools Discussed/Used WIC Program: Yes   Consult Status Consult Status: Follow-up Date: 06/19/19 Follow-up type: In-patient    Charyl Dancer 06/18/2019, 11:38 PM

## 2019-06-18 NOTE — Anesthesia Preprocedure Evaluation (Signed)
Anesthesia Evaluation  Patient identified by MRN, date of birth, ID band Patient awake    Reviewed: Allergy & Precautions, H&P , NPO status , Patient's Chart, lab work & pertinent test results  History of Anesthesia Complications Negative for: history of anesthetic complications  Airway Mallampati: II  TM Distance: >3 FB Neck ROM: full    Dental no notable dental hx. (+) Teeth Intact   Pulmonary neg pulmonary ROS,    Pulmonary exam normal breath sounds clear to auscultation       Cardiovascular negative cardio ROS Normal cardiovascular exam Rhythm:regular Rate:Normal     Neuro/Psych PSYCHIATRIC DISORDERS Depression negative neurological ROS     GI/Hepatic Neg liver ROS, GERD  Medicated and Controlled,  Endo/Other  negative endocrine ROS  Renal/GU negative Renal ROS  negative genitourinary   Musculoskeletal Scoliosis    Abdominal   Peds  Hematology  (+) anemia ,   Anesthesia Other Findings   Reproductive/Obstetrics (+) Pregnancy SROM                             Anesthesia Physical Anesthesia Plan  ASA: II and emergent  Anesthesia Plan: Epidural   Post-op Pain Management:    Induction:   PONV Risk Score and Plan: 2  Airway Management Planned: Natural Airway  Additional Equipment: None  Intra-op Plan:   Post-operative Plan:   Informed Consent: I have reviewed the patients History and Physical, chart, labs and discussed the procedure including the risks, benefits and alternatives for the proposed anesthesia with the patient or authorized representative who has indicated his/her understanding and acceptance.       Plan Discussed with:   Anesthesia Plan Comments:         Anesthesia Quick Evaluation

## 2019-06-19 LAB — RPR: RPR Ser Ql: NONREACTIVE

## 2019-06-19 NOTE — Progress Notes (Addendum)
Patient ID: Emily Cantu, female   DOB: [birth year], 24 y.o.   MRN: [id number] Post Partum Day 1  Subjective: no complaints, up ad lib, tolerating PO and + flatus  Objective: Blood pressure 106/61, pulse 72, temperature 98.1 F (36.7 C), temperature source Oral, resp. rate 18, height 5\' 4"  (1.626 m), weight 73 kg, last menstrual period 09/24/2018, SpO2 98 %, unknown if currently breastfeeding.  Physical Exam:  General: alert, cooperative and no distress Lochia: appropriate Uterine Fundus: firm DVT Evaluation: No evidence of DVT seen on physical exam.  Recent Labs    06/18/19 0904  HGB 14.1  HCT 41.5    Assessment/Plan: Plan for discharge tomorrow, Circumcision prior to discharge and Contraception (POPs)   LOS: 1 day   06/20/19 06/19/2019, 7:42 AM   I saw and evaluated the patient. I agree with the findings and the plan of care as documented in the student's note.   06/21/2019, MD [facility] Family Medicine Fellow, [facility] for [facility]

## 2019-06-19 NOTE — Progress Notes (Signed)
CSW consulted as it is reported that MOB has a hx of depression. CSW went to speak with  MOB at bedside to address further needs.   CSW congratulated MOB on the birth of infant. CSW advised MOB of CSW's role and the reason for CSW coming to see her. MOB reported that she has never been diagnosed with depression and is not sure where that diagnosis may have come from. MOB reports that she has never reported to anyone that she has felt depressed. CSW understanding and asked for permission from MOB to still give her education on PPD and SIDS. MOB agreeable. CSW provided MOB with PPD Checklist and advised MOB of who she could reach out to in the event that she feels that she is starting to develop PPD. MOB was very soft spoken and didn't say much.   CSW was advised that MOB has all needed items to care for infant at this time with no other needs.      Claude Manges Rolin Schult, MSW, LCSW [facility] [phone]

## 2019-06-19 NOTE — Anesthesia Postprocedure Evaluation (Signed)
Anesthesia Post Note  Patient: Emily Cantu  Procedure(s) Performed: AN AD HOC LABOR EPIDURAL     Patient location during evaluation: Mother Baby Anesthesia Type: Epidural Level of consciousness: awake, awake and alert and oriented Pain management: pain level not controlled (Notified RN that patient would like better pain control for perineal pain. Asked RN if patient could get dermaplast or an additional pain medication to help pain.) Vital Signs Assessment: post-procedure vital signs reviewed and stable Respiratory status: spontaneous breathing, nonlabored ventilation and respiratory function stable Cardiovascular status: stable Postop Assessment: no headache, no backache, patient able to bend at knees, no apparent nausea or vomiting, adequate PO intake and able to ambulate Anesthetic complications: no    Last Vitals:  Vitals:   06/18/19 2343 06/19/19 0454  BP: 106/61 106/61  Pulse: 81 72  Resp: 18 18  Temp: 36.6 C 36.7 C  SpO2: 99% 98%    Last Pain:  Vitals:   06/19/19 0454  TempSrc: Oral  PainSc: 0-No pain   Pain Goal: Patients Stated Pain Goal: 5 (06/18/19 0952)                 Aveion Nguyen

## 2019-06-20 MED ORDER — NORETHINDRONE 0.35 MG PO TABS
1.0000 | ORAL_TABLET | Freq: Every day | ORAL | 11 refills | Status: DC
Start: 2019-06-20 — End: 2020-08-23

## 2019-06-20 MED ORDER — IBUPROFEN 600 MG PO TABS: 600.0000 mg | ORAL_TABLET | Freq: Four times a day (QID) | ORAL | 0 refills | Status: DC

## 2019-06-20 MED ORDER — ACETAMINOPHEN 325 MG PO TABS: 650.0000 mg | ORAL_TABLET | Freq: Four times a day (QID) | ORAL | 0 refills | Status: DC | PRN

## 2019-06-20 MED ORDER — SENNOSIDES-DOCUSATE SODIUM 8.6-50 MG PO TABS: 2.0000 | ORAL_TABLET | ORAL | 0 refills | Status: DC

## 2019-06-20 NOTE — Lactation Note (Signed)
This note was copied from a baby's chart. Lactation Consultation Note  Patient Name: Emily Cantu TJQZE'S Date: 06/20/2019 Reason for consult: Follow-up assessment   P1, Baby 39 hours old and latching upon entering. Reviewed hand expression w/ good drops expressed. Mother states she has only been breastfeeding on one breast per session. Encouraged her to bf on both breasts and use hand pump after for 10 min since she is supplementing with formula. Observed latch.  Encouraged mother to compress breast during feeding to keep baby active. Suggest mother call WIC for DEBP. Mother is supplementing with formula.  Reviewed volume guidelines. Feed on demand with cues.  Goal 8-12+ times per day after first 24 hrs.  Place baby STS if not cueing.  Reviewed engorgement care and monitoring voids/stools.    Maternal Data    Feeding Feeding Type: Breast Fed  LATCH Score Latch: Grasps breast easily, tongue down, lips flanged, rhythmical sucking.  Audible Swallowing: A few with stimulation  Type of Nipple: Everted at rest and after stimulation  Comfort (Breast/Nipple): Soft / non-tender  Hold (Positioning): Assistance needed to correctly position infant at breast and maintain latch.  LATCH Score: 8  Interventions Interventions: Breast feeding basics reviewed;Assisted with latch;Hand express;Hand pump  Lactation Tools Discussed/Used     Consult Status Consult Status: Complete Date: 06/21/19 Follow-up type: In-patient    Dahlia Byes [facility] 06/20/2019, 8:37 AM

## 2019-06-22 ENCOUNTER — Ambulatory Visit: Payer: Medicaid Other | Admitting: Family Medicine

## 2019-06-23 ENCOUNTER — Encounter: Payer: Medicaid Other | Admitting: Obstetrics & Gynecology

## 2019-07-13 ENCOUNTER — Telehealth (INDEPENDENT_AMBULATORY_CARE_PROVIDER_SITE_OTHER): Payer: Medicaid Other | Admitting: Family Medicine

## 2019-07-13 NOTE — Progress Notes (Signed)
     I connected with@ on 07/13/19 at 10:45 AM EDT by: Mychart video and verified that I am speaking with the correct person using two identifiers.  Patient is located at home and provider is located at [employer] [facility] [employer] at [facility] .     The purpose of this virtual visit is to provide medical care while limiting exposure to the novel coronavirus. I discussed the limitations, risks, security and privacy concerns of performing an evaluation and management service by Dr Adrian Blackwater and the availability of in person appointments. I also discussed with the patient that there may be a patient responsible charge related to this service. By engaging in this virtual visit, you consent to the provision of healthcare.  Additionally, you authorize for your insurance to be billed for the services provided during this visit.  The patient expressed understanding and agreed to proceed.  The following staff members participated in the virtual visit:    Post Partum Visit Note Subjective:   Emily Cantu is a 24 y.o. [id number] female being evaluated for postpartum followup.  She is 4 weeks postpartum following a normal spontaneous vaginal delivery at  39 gestational weeks.  I have fully reviewed the prenatal and intrapartum course.  Postpartum course has been normal. Baby is doing well. Baby is feeding by breast. Bleeding no bleeding. Bowel function is normal. Bladder function is normal. Patient is not sexually active. Contraception method is oral progesterone-only contraceptive. Postpartum depression screening: negative.  The following portions of the patient's history were reviewed and updated as appropriate: allergies, current medications, past family history, past medical history, past social history, past surgical history and problem list.  Review of Systems Pertinent items are noted in HPI.   Objective:  There were no vitals filed for this visit. Self-Obtained       Assessment:    Normal postpartum exam.  Plan:  Essential components of care per ACOG recommendations:  1.  Mood and well being: Patient with negative depression screening today. Reviewed local resources for support.  - Patient does not use tobacco.  - hx of drug use? No  2. Infant care and feeding:  -Patient currently breastmilk feeding? Yes If breastmilk feeding discussed return to work and pumping. If needed, patient was provided letter for work to allow for every 2-3 hr pumping breaks, and to be granted a private location to express breastmilk and refrigerated area to store breastmilk. Reviewed importance of draining breast regularly to support lactation. -Social determinants of health (SDOH) reviewed in EPIC. No concerns  3. Sexuality, contraception and birth spacing - Patient does not want a pregnancy in the next year.   - Reviewed forms of contraception in tiered fashion. Patient desired oral progesterone-only contraceptive today.   - Discussed birth spacing of 18 months  4. Sleep and fatigue -Encouraged family/partner/community support of 4 hrs of uninterrupted sleep to help with mood and fatigue  5. Physical Recovery  - Discussed patients delivery and complications - Perineal healing reviewed. Patient expressed understanding - Patient has urinary incontinence? No  - Patient is safe to resume physical and sexual activity  6.  Health Maintenance - Last pap smear done 2019 and was normal with negative HPV.  15 minutes of non-face-to-face time spent with the patient    Levie Heritage, DO [facility] [employer], [facility]

## 2019-09-26 ENCOUNTER — Encounter: Payer: Self-pay | Admitting: Advanced Practice Midwife

## 2019-09-26 ENCOUNTER — Ambulatory Visit (INDEPENDENT_AMBULATORY_CARE_PROVIDER_SITE_OTHER): Payer: Medicaid Other | Admitting: Advanced Practice Midwife

## 2019-09-26 ENCOUNTER — Other Ambulatory Visit: Payer: Self-pay

## 2019-09-26 VITALS — BP 100/62 | HR 47 | Wt 148.0 lb

## 2019-09-26 DIAGNOSIS — M25552 Pain in left hip: Secondary | ICD-10-CM

## 2019-09-26 DIAGNOSIS — N898 Other specified noninflammatory disorders of vagina: Secondary | ICD-10-CM | POA: Diagnosis not present

## 2019-09-26 DIAGNOSIS — R102 Pelvic and perineal pain: Secondary | ICD-10-CM

## 2019-09-26 LAB — POCT URINE PREGNANCY: Preg Test, Ur: NEGATIVE

## 2019-09-26 MED ORDER — TERCONAZOLE 0.4 % VA CREA
1.0000 | TOPICAL_CREAM | Freq: Every day | VAGINAL | 0 refills | Status: DC
Start: 2019-09-26 — End: 2020-10-17

## 2019-09-26 MED ORDER — NORGESTIMATE-ETH ESTRADIOL 0.25-35 MG-MCG PO TABS
1.0000 | ORAL_TABLET | Freq: Every day | ORAL | 12 refills | Status: DC
Start: 2019-09-26 — End: 2020-08-23

## 2019-09-26 NOTE — Patient Instructions (Signed)
Health Maintenance, Female Adopting a healthy lifestyle and getting preventive care are important in promoting health and wellness. Ask your health care provider about:  The right schedule for you to have regular tests and exams.  Things you can do on your own to prevent diseases and keep yourself healthy. What should I know about diet, weight, and exercise? Eat a healthy diet   Eat a diet that includes plenty of vegetables, fruits, low-fat dairy products, and lean protein.  Do not eat a lot of foods that are high in solid fats, added sugars, or sodium. Maintain a healthy weight Body mass index (BMI) is used to identify weight problems. It estimates body fat based on height and weight. Your health care provider can help determine your BMI and help you achieve or maintain a healthy weight. Get regular exercise Get regular exercise. This is one of the most important things you can do for your health. Most adults should:  Exercise for at least 150 minutes each week. The exercise should increase your heart rate and make you sweat (moderate-intensity exercise).  Do strengthening exercises at least twice a week. This is in addition to the moderate-intensity exercise.  Spend less time sitting. Even light physical activity can be beneficial. Watch cholesterol and blood lipids Have your blood tested for lipids and cholesterol at 23 years of age, then have this test every 5 years. Have your cholesterol levels checked more often if:  Your lipid or cholesterol levels are high.  You are older than 23 years of age.  You are at high risk for heart disease. What should I know about cancer screening? Depending on your health history and family history, you may need to have cancer screening at various ages. This may include screening for:  Breast cancer.  Cervical cancer.  Colorectal cancer.  Skin cancer.  Lung cancer. What should I know about heart disease, diabetes, and high blood  pressure? Blood pressure and heart disease  High blood pressure causes heart disease and increases the risk of stroke. This is more likely to develop in people who have high blood pressure readings, are of African descent, or are overweight.  Have your blood pressure checked: ? Every 3-5 years if you are 68-56 years of age. ? Every year if you are 44 years old or older. Diabetes Have regular diabetes screenings. This checks your fasting blood sugar level. Have the screening done:  Once every three years after age 35 if you are at a normal weight and have a low risk for diabetes.  More often and at a younger age if you are overweight or have a high risk for diabetes. What should I know about preventing infection? Hepatitis B If you have a higher risk for hepatitis B, you should be screened for this virus. Talk with your health care provider to find out if you are at risk for hepatitis B infection. Hepatitis C Testing is recommended for:  Everyone born from [birth year] through 1964.  Anyone with known risk factors for hepatitis C. Sexually transmitted infections (STIs)  Get screened for STIs, including gonorrhea and chlamydia, if: ? You are sexually active and are younger than 23 years of age. ? You are older than 23 years of age and your health care provider tells you that you are at risk for this type of infection. ? Your sexual activity has changed since you were last screened, and you are at increased risk for chlamydia or gonorrhea. Ask your health care provider if  you are at risk.  Ask your health care provider about whether you are at high risk for HIV. Your health care provider may recommend a prescription medicine to help prevent HIV infection. If you choose to take medicine to prevent HIV, you should first get tested for HIV. You should then be tested every 3 months for as long as you are taking the medicine. Pregnancy  If you are about to stop having your period (premenopausal) and  you may become pregnant, seek counseling before you get pregnant.  Take 400 to 800 micrograms (mcg) of folic acid every day if you become pregnant.  Ask for birth control (contraception) if you want to prevent pregnancy. Osteoporosis and menopause Osteoporosis is a disease in which the bones lose minerals and strength with aging. This can result in bone fractures. If you are 36 years old or older, or if you are at risk for osteoporosis and fractures, ask your health care provider if you should:  Be screened for bone loss.  Take a calcium or vitamin D supplement to lower your risk of fractures.  Be given hormone replacement therapy (HRT) to treat symptoms of menopause. Follow these instructions at home: Lifestyle  Do not use any products that contain nicotine or tobacco, such as cigarettes, e-cigarettes, and chewing tobacco. If you need help quitting, ask your health care provider.  Do not use street drugs.  Do not share needles.  Ask your health care provider for help if you need support or information about quitting drugs. Alcohol use  Do not drink alcohol if: ? Your health care provider tells you not to drink. ? You are pregnant, may be pregnant, or are planning to become pregnant.  If you drink alcohol: ? Limit how much you use to 0-1 drink a day. ? Limit intake if you are breastfeeding.  Be aware of how much alcohol is in your drink. In the [place] one drink equals one 12 oz bottle of beer (355 mL), one 5 oz glass of wine (148 mL), or one 1 oz glass of hard liquor (44 mL). General instructions  Schedule regular health, dental, and eye exams.  Stay current with your vaccines.  Tell your health care provider if: ? You often feel depressed. ? You have ever been abused or do not feel safe at home. Summary  Adopting a healthy lifestyle and getting preventive care are important in promoting health and wellness.  Follow your health care provider's instructions about healthy  diet, exercising, and getting tested or screened for diseases.  Follow your health care provider's instructions on monitoring your cholesterol and blood pressure. This information is not intended to replace advice given to you by your health care provider. Make sure you discuss any questions you have with your health care provider. Document Revised: 02/02/2018 Document Reviewed: 02/02/2018 Elsevier Patient Education  2020 [employer]. Vaginitis Vaginitis is a condition in which the vaginal tissue swells and becomes red (inflamed). This condition is most often caused by a change in the normal balance of bacteria and yeast that live in the vagina. This change causes an overgrowth of certain bacteria or yeast, which causes the inflammation. There are different types of vaginitis, but the most common types are:  Bacterial vaginosis.  Yeast infection (candidiasis).  Trichomoniasis vaginitis. This is a sexually transmitted disease (STD).  Viral vaginitis.  Atrophic vaginitis.  Allergic vaginitis. What are the causes? The cause of this condition depends on the type of vaginitis. It can be caused by:  Bacteria (bacterial vaginosis).  Yeast, which is a fungus (yeast infection).  A parasite (trichomoniasis vaginitis).  A virus (viral vaginitis).  Low hormone levels (atrophic vaginitis). Low hormone levels can occur during pregnancy, breastfeeding, or after menopause.  Irritants, such as bubble baths, scented tampons, and feminine sprays (allergic vaginitis). Other factors can change the normal balance of the yeast and bacteria that live in the vagina. These include:  Antibiotic medicines.  Poor hygiene.  Diaphragms, vaginal sponges, spermicides, birth control pills, and intrauterine devices (IUD).  Sex.  Infection.  Uncontrolled diabetes.  A weakened defense (immune) system. What increases the risk? This condition is more likely to develop in women who:  Smoke.  Use  vaginal douches, scented tampons, or scented sanitary pads.  Wear tight-fitting pants.  Wear thong underwear.  Use oral birth control pills or an IUD.  Have sex without a condom.  Have multiple sex partners.  Have an STD.  Frequently use the spermicide nonoxynol-9.  Eat lots of foods high in sugar.  Have uncontrolled diabetes.  Have low estrogen levels.  Have a weakened immune system from an immune disorder or medical treatment.  Are pregnant or breastfeeding. What are the signs or symptoms? Symptoms vary depending on the cause of the vaginitis. Common symptoms include:  Abnormal vaginal discharge. ? The discharge is white, gray, or yellow with bacterial vaginosis. ? The discharge is thick, white, and cheesy with a yeast infection. ? The discharge is frothy and yellow or greenish with trichomoniasis.  A bad vaginal smell. The smell is fishy with bacterial vaginosis.  Vaginal itching, pain, or swelling.  Sex that is painful.  Pain or burning when urinating. Sometimes there are no symptoms. How is this diagnosed? This condition is diagnosed based on your symptoms and medical history. A physical exam, including a pelvic exam, will also be done. You may also have other tests, including:  Tests to determine the pH level (acidity or alkalinity) of your vagina.  A whiff test, to assess the odor that results when a sample of your vaginal discharge is mixed with a potassium hydroxide solution.  Tests of vaginal fluid. A sample will be examined under a microscope. How is this treated? Treatment varies depending on the type of vaginitis you have. Your treatment may include:  Antibiotic creams or pills to treat bacterial vaginosis and trichomoniasis.  Antifungal medicines, such as vaginal creams or suppositories, to treat a yeast infection.  Medicine to ease discomfort if you have viral vaginitis. Your sexual partner should also be treated.  Estrogen delivered in a  cream, pill, suppository, or vaginal ring to treat atrophic vaginitis. If vaginal dryness occurs, lubricants and moisturizing creams may help. You may need to avoid scented soaps, sprays, or douches.  Stopping use of a product that is causing allergic vaginitis. Then using a vaginal cream to treat the symptoms. Follow these instructions at home: Lifestyle  Keep your genital area clean and dry. Avoid soap, and only rinse the area with water.  Do not douche or use tampons until your health care provider says it is okay to do so. Use sanitary pads, if needed.  Do not have sex until your health care provider approves. When you can return to sex, practice safe sex and use condoms.  Wipe from front to back. This avoids the spread of bacteria from the rectum to the vagina. General instructions  Take over-the-counter and prescription medicines only as told by your health care provider.  If you were prescribed an  antibiotic medicine, take or use it as told by your health care provider. Do not stop taking or using the antibiotic even if you start to feel better.  Keep all follow-up visits as told by your health care provider. This is important. How is this prevented?  Use mild, non-scented products. Do not use things that can irritate the vagina, such as fabric softeners. Avoid the following products if they are scented: ? Feminine sprays. ? Detergents. ? Tampons. ? Feminine hygiene products. ? Soaps or bubble baths.  Let air reach your genital area. ? Wear cotton underwear to reduce moisture buildup. ? Avoid wearing underwear while you sleep. ? Avoid wearing tight pants and underwear or nylons without a cotton panel. ? Avoid wearing thong underwear.  Take off any wet clothing, such as bathing suits, as soon as possible.  Practice safe sex and use condoms. Contact a health care provider if:  You have abdominal pain.  You have a fever.  You have symptoms that last for more than 2-3  days. Get help right away if:  You have a fever and your symptoms suddenly get worse. Summary  Vaginitis is a condition in which the vaginal tissue becomes inflamed.This condition is most often caused by a change in the normal balance of bacteria and yeast that live in the vagina.  Treatment varies depending on the type of vaginitis you have.  Do not douche, use tampons , or have sex until your health care provider approves. When you can return to sex, practice safe sex and use condoms. This information is not intended to replace advice given to you by your health care provider. Make sure you discuss any questions you have with your health care provider. Document Revised: 01/22/2017 Document Reviewed: 03/17/2016 Elsevier Patient Education  2020 [employer].

## 2019-09-26 NOTE — Progress Notes (Signed)
GYNECOLOGY CLINIC ANNUAL PREVENTATIVE CARE ENCOUNTER NOTE  Subjective:   Emily Cantu is a 24 y.o. [id number] female here for a GYN problem visit. Current complaints: Had a day or two of right sided pain, along with thin mucous.  Stopped a day lator, no pain now.   Also has some intermittent sciatic pain.  Has some vaginal irritation.  Never started OCPs postpartum No longer breastfeeding.  States has not had intercourse yet.  .   Denies abnormal vaginal bleeding, discharge, problems with intercourse or other gynecologic concerns.    Gynecologic History Patient's last menstrual period was 08/29/2019. Contraception: MiniPills, not taking   Obstetric History OB History  Gravida Para Term Preterm AB Living  _0 SAB TAB Ectopic Multiple Live Births    1   0 2    # Outcome Date GA Lbr Len/2nd Weight Sex Delivery Anes PTL Lv  3 Term 06/18/19 49w1d09:15 / 00:12 6 lb 8.1 oz (2.951 kg) M Vag-Spont EPI  LIV  2 TAB 05/28/18          1 Term 12/25/16 487w3d1:34 / 01:02 6 lb 8.8 oz (2.971 kg) M Vag-Spont EPI  LIV    Past Medical History:  Diagnosis Date  . Abortion   . Back pain affecting pregnancy   . Depression   . Scoliosis   . Subchorionic hematoma     Past Surgical History:  Procedure Laterality Date  . NO PAST SURGERIES    . THERAPEUTIC ABORTION  05/28/2018    Current Outpatient Medications on File Prior to Visit  Medication Sig Dispense Refill  . acetaminophen (TYLENOL) 325 MG tablet Take 2 tablets (650 mg total) by mouth every 6 (six) hours as needed (for pain scale < 4). (Patient not taking: Reported on 09/26/2019) 30 tablet 0  . AMBULATORY NON FORMULARY MEDICATION 1 Device by Other route once a week. Blood pressure cuff/Medium Monitored Regularly at home. ICD 10: Z34.90 LROB (Patient not taking: Reported on 09/26/2019) 1 kit 0  . ibuprofen (ADVIL) 600 MG tablet Take 1 tablet (600 mg total) by mouth every 6 (six) hours. (Patient not taking: Reported on 09/26/2019) 30  tablet 0  . norethindrone (MICRONOR) 0.35 MG tablet Take 1 tablet (0.35 mg total) by mouth daily. (Patient not taking: Reported on 09/26/2019) 1 Package 11  . Prenatal Vit-Fe Fumarate-FA (PRENATAL VITAMINS PO) Take by mouth. (Patient not taking: Reported on 09/26/2019)    . senna-docusate (SENOKOT-S) 8.6-50 MG tablet Take 2 tablets by mouth daily. (Patient not taking: Reported on 09/26/2019) 30 tablet 0  . [DISCONTINUED] ferrous sulfate (FERROUSUL) 325 (65 FE) MG tablet Take 1 tablet (325 mg total) by mouth 2 (two) times daily. 60 tablet 1  . [DISCONTINUED] misoprostol (CYTOTEC) 200 MCG tablet Take 1 tablet (200 mcg total) by mouth 3 (three) times daily. 9 tablet 0   No current facility-administered medications on file prior to visit.    No Known Allergies  Social History   Socioeconomic History  . Marital status: Single    Spouse name: Not on file  . Number of children: 1  . Years of education: Not on file  . Highest education level: Not on file  Occupational History  . Not on file  Tobacco Use  . Smoking status: Never Smoker  . Smokeless tobacco: Never Used  Vaping Use  . Vaping Use: Never used  Substance and Sexual Activity  . Alcohol use: No  . Drug use: No  .  Sexual activity: Yes    Birth control/protection: None  Other Topics Concern  . Not on file  Social History Narrative  . Not on file   Social Determinants of Health   Financial Resource Strain:   . Difficulty of Paying Living Expenses:   Food Insecurity:   . Worried About Charity fundraiser in the Last Year:   . Arboriculturist in the Last Year:   Transportation Needs:   . Film/video editor (Medical):   Marland Kitchen Lack of Transportation (Non-Medical):   Physical Activity:   . Days of Exercise per Week:   . Minutes of Exercise per Session:   Stress:   . Feeling of Stress :   Social Connections:   . Frequency of Communication with Friends and Family:   . Frequency of Social Gatherings with Friends and Family:   .  Attends Religious Services:   . Active Member of Clubs or Organizations:   . Attends Archivist Meetings:   Marland Kitchen Marital Status:   Intimate Partner Violence:   . Fear of Current or Ex-Partner:   . Emotionally Abused:   Marland Kitchen Physically Abused:   . Sexually Abused:     Family History  Problem Relation Age of Onset  . Hypertension Mother   . Cancer Father   . Hypertension Maternal Grandmother     The following portions of the patient's history were reviewed and updated as appropriate: allergies, current medications, past family history, past medical history, past social history, past surgical history and problem list.  Review of Systems Pertinent items noted in HPI and remainder of comprehensive ROS otherwise negative.   Objective:  BP 100/62   Pulse (!) 47   Wt 148 lb (67.1 kg)   LMP 08/29/2019   Breastfeeding No   BMI 25.40 kg/m  CONSTITUTIONAL: Well-developed, well-nourished female in no acute distress.  HENT:  Normocephalic, atraumatic, External right and left ear normal. Oropharynx is clear and moist  SKIN: Skin is warm and dry. No rash noted. Not diaphoretic. No erythema. No pallor. [place]: Alert and oriented to person, place, and time. Normal reflexes, muscle tone coordination.  PSYCHIATRIC: Normal mood and affect. Normal behavior. Normal judgment and thought content. CARDIOVASCULAR: Normal heart rate noted, regular rhythm RESPIRATORY: Clear to auscultation bilaterally. Effort and breath sounds normal, no problems with respiration noted. BREASTS: Symmetric in size. No masses, skin changes, nipple drainage, or lymphadenopathy. ABDOMEN: Soft, normal bowel sounds, no distention noted.  No tenderness, rebound or guarding.  PELVIC: Normal appearing external genitalia; normal appearing vaginal mucosa and cervix.  No abnormal discharge noted.  Normal uterine size, no other palpable masses, no uterine or adnexal tenderness.   After exam had some vaginal irritation feeling.   MUSCULOSKELETAL: Normal range of motion. No tenderness.  No cyanosis, clubbing, or edema.    Assessment:  Intermittent pelvic pain, likely [place] Vaginitis Not breastfeeding Needs contraception Sciatica   Plan:  Urine culture sent Rx sent for Sprintec OCPs instead of Mini Pills  Warned about side effects and risk of VTE Rx Terazol cream for vaginal irritation Urine to culture Routine preventative health maintenance measures emphasized. Please refer to After Visit Summary for other counseling recommendations.   Seabron Spates, [place] for [employer]

## 2019-09-28 LAB — URINE CULTURE: Organism ID, Bacteria: NO GROWTH

## 2019-10-06 ENCOUNTER — Ambulatory Visit (INDEPENDENT_AMBULATORY_CARE_PROVIDER_SITE_OTHER): Payer: Medicaid Other

## 2019-10-06 ENCOUNTER — Other Ambulatory Visit (HOSPITAL_COMMUNITY)
Admission: RE | Admit: 2019-10-06 | Discharge: 2019-10-06 | Disposition: A | Discharge status: Home / Self Care | Payer: Medicaid Other | Source: Ambulatory Visit | Attending: Obstetrics & Gynecology | Admitting: Obstetrics & Gynecology

## 2019-10-06 ENCOUNTER — Other Ambulatory Visit: Payer: Self-pay

## 2019-10-06 VITALS — BP 101/57 | HR 62 | Wt 149.0 lb

## 2019-10-06 DIAGNOSIS — Z113 Encounter for screening for infections with a predominantly sexual mode of transmission: Secondary | ICD-10-CM | POA: Insufficient documentation

## 2019-10-06 NOTE — Progress Notes (Addendum)
Pt presents for STI screening. Pt states she has not had any new partners. Self swab was sent to the lab.  Kaianna Dolezal l Ivalee Strauser, CMA   Attestation of Attending Supervision of CMA/RN: Evaluation and management procedures were performed by the nurse under my supervision and collaboration.  I have reviewed the nursing note and chart, and I agree with the management and plan.  Carolyn L. Harraway-Smith, M.D., Evern Core

## 2019-10-09 LAB — CERVICOVAGINAL ANCILLARY ONLY
Chlamydia: NEGATIVE
Comment: NEGATIVE
Comment: NEGATIVE
Comment: NORMAL
Neisseria Gonorrhea: NEGATIVE
Trichomonas: NEGATIVE

## 2019-10-12 ENCOUNTER — Other Ambulatory Visit: Payer: Self-pay

## 2019-10-12 ENCOUNTER — Encounter: Payer: Self-pay | Admitting: Physical Therapy

## 2019-10-12 ENCOUNTER — Ambulatory Visit: Payer: Medicaid Other | Attending: Advanced Practice Midwife | Admitting: Physical Therapy

## 2019-10-12 DIAGNOSIS — R262 Difficulty in walking, not elsewhere classified: Secondary | ICD-10-CM | POA: Diagnosis present

## 2019-10-12 DIAGNOSIS — M25552 Pain in left hip: Secondary | ICD-10-CM | POA: Insufficient documentation

## 2019-10-12 DIAGNOSIS — G8929 Other chronic pain: Secondary | ICD-10-CM | POA: Insufficient documentation

## 2019-10-12 DIAGNOSIS — M545 Low back pain, unspecified: Secondary | ICD-10-CM

## 2019-10-12 DIAGNOSIS — M25652 Stiffness of left hip, not elsewhere classified: Secondary | ICD-10-CM

## 2019-10-12 DIAGNOSIS — M6281 Muscle weakness (generalized): Secondary | ICD-10-CM | POA: Insufficient documentation

## 2019-10-12 NOTE — Therapy (Addendum)
[facility] [address]  [place], [place], [address] Phone: [phone]   Fax:  [phone]  Physical Therapy Evaluation  Patient Details  Name: Emily Cantu MRN: [id number] Date of Birth: 11-03-1995 Referring Provider (PT): Hansel Feinstein, CNM   Encounter Date: 10/12/2019   PT End of Session - 10/12/19 1202    Visit Number 1    Number of Visits 7    Date for PT Re-Evaluation 11/23/19    Authorization Type [employer] Medicaid    PT Start Time 1102    PT Stop Time 1139    PT Time Calculation (min) 37 min    Activity Tolerance Patient tolerated treatment well    Behavior During Therapy [facility] for tasks assessed/performed           Past Medical History:  Diagnosis Date  . Abortion   . Back pain affecting pregnancy   . Depression   . Scoliosis   . Subchorionic hematoma     Past Surgical History:  Procedure Laterality Date  . NO PAST SURGERIES    . THERAPEUTIC ABORTION  05/28/2018    There were no vitals filed for this visit.    Subjective Assessment - 10/12/19 1104    Subjective Patient reports that she has had pain over the R posterolateral buttock while she was pregnant but since giving birth on 06/18/19, pain is now located over the L anterolateral hip. Patient had a vaginal delivery and was given an epidural. Had been treated for pain with spinal manipulations by Dr. Nehemiah Settle with some improvement. Pain worse with walking and standing up from sitting. Denies N/T. Better with sitting. Also notes chronic back pain since she was diagnosed with scoliosis when she was 13. Feels that this pain has worsened since her epidural. Worse when she sits up tall or when bending back. Pain occurs in the midback.    Pertinent History scoliosis, depression, back pain affecting pregnancy    Limitations Standing;Walking;House hold activities    How long can you sit comfortably? unlimited    How long can you stand comfortably? 5  minutes    How long can you walk comfortably? 30 min    Diagnostic tests none recent    Patient Stated Goals " want the pain to go away"    Currently in Pain? Yes    Pain Location Hip    Pain Orientation Left;Anterior;Lateral    Pain Descriptors / Indicators Aching;Sharp    Pain Type Chronic pain              [facility] PT Assessment - 10/12/19 1111      Assessment   Medical Diagnosis L hip pain    Referring Provider (PT) Hansel Feinstein, CNM    Onset Date/Surgical Date 06/21/19    Next MD Visit not scheduled    Prior Therapy no      Precautions   Precautions None      Balance Screen   Has the patient fallen in the past 6 months No    Has the patient had a decrease in activity level because of a fear of falling?  No    Is the patient reluctant to leave their home because of a fear of falling?  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, sitting    Leisure none      Cognition   Overall Cognitive Status Within Functional Limits for tasks assessed      Sensation   Light Touch Appears Intact      Coordination   Gross Motor Movements are Fluid and Coordinated Yes      Posture/Postural Control   Posture/Postural Control Postural limitations    Postural Limitations Rounded Shoulders;Weight shift right    Posture Comments minor L concave thoracic scoliosis      ROM / Strength   AROM / PROM / Strength AROM;Strength      AROM   AROM Assessment Site Hip    Right/Left Hip Right;Left    Right Hip Flexion 114    Right Hip External Rotation  44    Right  Hip Internal Rotation  48    Left Hip Flexion 104   pain over TFL   Left Hip External Rotation  30    Left Hip Internal Rotation  36      Strength   Strength Assessment Site Hip;Knee;Ankle    Right/Left Hip Right;Left    Right Hip Flexion 4+/5    Right Hip External Rotation  4+/5    Right Hip Internal Rotation 4/5    Right Hip ABduction 4/5    Right Hip ADduction 4+/5    Left Hip Flexion 4+/5    Left Hip External Rotation 4/5    Left Hip Internal Rotation 4/5   pain in TFL   Left Hip ABduction 4/5    Left Hip ADduction 4+/5    Right/Left Knee Right;Left    Right Knee Flexion 4+/5    Right Knee Extension 5/5    Left Knee Flexion 4+/5    Left Knee Extension 5/5    Right/Left Ankle Right;Left    Right Ankle Dorsiflexion 4+/5    Right Ankle Plantar Flexion 4+/5    Left Ankle Dorsiflexion 4+/5    Left Ankle Plantar Flexion 4+/5      Flexibility   Soft Tissue Assessment /Muscle Length yes    Hamstrings B WFL    Quadriceps B WNL in mod thomas    ITB L WNL    Piriformis B WNL, painful on L with KTOS and fig 4      Palpation   SI assessment  PSIS and ASIS symmetrical    Palpation comment no TTP over LB and hips; slightly increased soft tissue restriction R piriformis and QL      Ambulation/Gait   Assistive device None    Gait Pattern Step-through pattern;Lateral hip instability    Ambulation Surface Level;Indoor    Gait velocity WNL                      Objective measurements completed on examination: See above findings.               PT Education - 10/12/19 1200    Education Details prognosis, POC, HEP    Person(s) Educated Patient    Methods Explanation;Demonstration;Tactile cues;Verbal cues;Handout    Comprehension Verbalized understanding;Returned demonstration  PT Short Term Goals - 10/12/19 1208      PT SHORT TERM GOAL #1   Title Patient to be independent with initial HEP.    Time 3    Period Weeks    Status New     Target Date 11/02/19             PT Long Term Goals - 10/12/19 1208      PT LONG TERM GOAL #1   Title Patient to be independent with advanced HEP.    Time 6    Period Weeks    Status New    Target Date 11/23/19      PT LONG TERM GOAL #2   Title Patient to demonstrate L hip AROM WFL and without pain limiting.    Time 6    Period Weeks    Status New    Target Date 11/23/19      PT LONG TERM GOAL #3   Title Patient to demonstrate B LE strength >/=4+/5.    Time 6    Period Weeks    Status New    Target Date 11/23/19      PT LONG TERM GOAL #4   Title Patient to report tolerance for walking without limitation d/t pain.    Time 6    Period Weeks    Status New    Target Date 11/23/19      PT LONG TERM GOAL #5   Title Patient to report improved tolerance of upright sitting without pain.    Time 6    Period Weeks    Status New    Target Date 11/23/19                  Plan - 10/12/19 1202    Clinical Impression Statement Patient is a 24y/o F, ~4 months postpartum, presenting to OPPT with c/o chronic L hip and LBP. L hip pain began 3 days after vaginal delivery and is located over the L anterolateral hip. Worse with walking and STS transfers. Patient also reports being diagnosed with scoliosis since age 22 and having midback pain since. This is worse when sitting up tall or with lumbar extension. Patient today presenting with rounded and slightly scoliotic posture, limited and painful L hip AROM, decreased L hip strength and pain with L piriformis stretching. Patient was educated on gentle stretching and strengthening HEP- patient reported understanding. Plan to further assess lumbar spine next session. Would benefit from skilled PT services 1x/week for 6 weeks to address aforementioned impairments.    Personal Factors and Comorbidities Age;Fitness;Comorbidity 3+;Time since onset of injury/illness/exacerbation;Sex;Profession;Past/Current Experience    Comorbidities  scoliosis, depression, back pain affecting pregnancy    Examination-Activity Limitations Transfers;Bend;Caring for Others;Squat;Lift;Stand    Examination-Participation Restrictions Church;Occupation;Meal Prep;Laundry;Shop;Interpersonal Relationship;Driving;Community Activity;Cleaning    Stability/Clinical Decision Making Stable/Uncomplicated    Clinical Decision Making Low    Rehab Potential Good    PT Frequency 1x / week    PT Duration 6 weeks    PT Treatment/Interventions ADLs/Self Care Home Management;Cryotherapy;Electrical Stimulation;Ultrasound;Moist Heat;Iontophoresis 66m/ml Dexamethasone;Gait training;Stair training;Functional mobility training;Therapeutic activities;Therapeutic exercise;Balance training;Neuromuscular re-education;Manual techniques;Patient/family education;Passive range of motion;Dry needling;Energy conservation;Taping    PT Next Visit Plan reassess HEP, assess lumbar AROM, progress L hip ROM and strength    Consulted and Agree with Plan of Care Patient           Patient will benefit from skilled therapeutic intervention in order to improve the following deficits and impairments:  Decreased range of  motion, Difficulty walking, Increased fascial restricitons, Increased muscle spasms, Decreased activity tolerance, Pain, Impaired flexibility, Improper body mechanics, Postural dysfunction, Decreased strength, Hypomobility, Decreased balance  Visit Diagnosis: Pain in left hip  Stiffness of left hip, not elsewhere classified  Muscle weakness (generalized)  Chronic midline low back pain without sciatica  Difficulty in walking, not elsewhere classified     Problem List Patient Active Problem List   Diagnosis Date Noted  . Chlamydia infection affecting pregnancy in third trimester 06/18/2019  . Supervision of other normal pregnancy, antepartum 11/01/2018  . Subchorionic hematoma in first trimester 11/01/2018  . Vaginal delivery 12/24/2016     Janene Harvey, PT, DPT 10/12/19 12:11 PM   [facility] [address]  [place], [place], [address] Phone: [phone]   Fax:  [phone]  Name: Emily Cantu MRN: [id number] Date of Birth: 11/15/1995   PHYSICAL THERAPY DISCHARGE SUMMARY  Visits from Start of Care: 1  Current functional level related to goals / functional outcomes: See above clinical impression; patient did not return   Remaining deficits: See above   Education / Equipment: HEP  Plan: Patient agrees to discharge.  Patient goals were not met. Patient is being discharged due to not returning since the last visit.  ?????     Janene Harvey, PT, DPT 11/13/19 11:38 AM

## 2019-10-19 ENCOUNTER — Ambulatory Visit: Payer: Medicaid Other

## 2019-10-26 ENCOUNTER — Ambulatory Visit: Payer: Medicaid Other | Attending: Advanced Practice Midwife | Admitting: Physical Therapy

## 2019-11-02 ENCOUNTER — Ambulatory Visit: Payer: Medicaid Other

## 2019-11-09 ENCOUNTER — Encounter: Payer: Medicaid Other | Admitting: Physical Therapy

## 2019-11-16 ENCOUNTER — Encounter: Payer: Medicaid Other | Admitting: Physical Therapy

## 2019-11-23 ENCOUNTER — Encounter: Payer: Medicaid Other | Admitting: Physical Therapy

## 2019-12-25 ENCOUNTER — Ambulatory Visit: Payer: Medicaid Other | Admitting: Obstetrics & Gynecology

## 2020-01-22 ENCOUNTER — Other Ambulatory Visit (HOSPITAL_COMMUNITY)
Admission: RE | Admit: 2020-01-22 | Discharge: 2020-01-22 | Disposition: A | Discharge status: Home / Self Care | Payer: Medicaid Other | Source: Ambulatory Visit | Attending: Obstetrics & Gynecology | Admitting: Obstetrics & Gynecology

## 2020-01-22 ENCOUNTER — Other Ambulatory Visit: Payer: Self-pay

## 2020-01-22 ENCOUNTER — Ambulatory Visit (INDEPENDENT_AMBULATORY_CARE_PROVIDER_SITE_OTHER): Payer: Medicaid Other

## 2020-01-22 VITALS — BP 112/53 | HR 58 | Wt 146.1 lb

## 2020-01-22 DIAGNOSIS — N898 Other specified noninflammatory disorders of vagina: Secondary | ICD-10-CM

## 2020-01-22 DIAGNOSIS — R1031 Right lower quadrant pain: Secondary | ICD-10-CM

## 2020-01-22 LAB — POCT URINALYSIS DIPSTICK
Bilirubin, UA: NEGATIVE
Blood, UA: NEGATIVE
Glucose, UA: NEGATIVE
Ketones, UA: NEGATIVE
Leukocytes, UA: NEGATIVE
Nitrite, UA: NEGATIVE
Protein, UA: NEGATIVE
Spec Grav, UA: 1.01 (ref 1.010–1.025)
Urobilinogen, UA: 0.2 E.U./dL
pH, UA: 7 (ref 5.0–8.0)

## 2020-01-22 NOTE — Progress Notes (Signed)
Pt presents with abnormal discharge and odor x 3 weeks. Pt states she is having right lower abdominal pain and would like to be screened for STD's. UA was negative. Self swab was sent to the lab.  Emily Cantu l Emily Cantu, CMA

## 2020-01-23 NOTE — Progress Notes (Signed)
Patient was assessed and managed by nursing staff during this encounter. I have reviewed the chart and agree with the documentation and plan. I have also made any necessary editorial changes.  Christino Mcglinchey, MD 01/23/2020 10:37 AM 

## 2020-01-24 ENCOUNTER — Telehealth: Payer: Self-pay

## 2020-01-24 LAB — CERVICOVAGINAL ANCILLARY ONLY
Bacterial Vaginitis (gardnerella): NEGATIVE
Candida Glabrata: NEGATIVE
Candida Vaginitis: NEGATIVE
Chlamydia: NEGATIVE
Comment: NEGATIVE
Comment: NEGATIVE
Comment: NEGATIVE
Comment: NEGATIVE
Comment: NORMAL
Neisseria Gonorrhea: NEGATIVE

## 2020-01-24 NOTE — Telephone Encounter (Signed)
Pt called stating she received her negative test results via Mychart. Pt states she doesn't she has rt sided pain and milky discharge sometimes. Explained to pt that vaginal discharge can change close to her menstrual cycle. Advised pt to use unscented Dove and cotton underwear. Also advised pt to sleep without underwear if she can and to call the office if she starts to have more pain. Understanding was voiced. Olivea Sonnen l Oluwadamilola Deliz, CMA

## 2020-02-29 ENCOUNTER — Emergency Department (HOSPITAL_BASED_OUTPATIENT_CLINIC_OR_DEPARTMENT_OTHER)
Admission: EM | Admit: 2020-02-29 | Discharge: 2020-02-29 | Discharge status: Against Medical Advice | Payer: Medicaid Other | Attending: Emergency Medicine | Admitting: Emergency Medicine

## 2020-02-29 ENCOUNTER — Emergency Department (HOSPITAL_BASED_OUTPATIENT_CLINIC_OR_DEPARTMENT_OTHER): Payer: Medicaid Other

## 2020-02-29 ENCOUNTER — Encounter (HOSPITAL_BASED_OUTPATIENT_CLINIC_OR_DEPARTMENT_OTHER): Payer: Self-pay

## 2020-02-29 ENCOUNTER — Other Ambulatory Visit: Payer: Self-pay

## 2020-02-29 DIAGNOSIS — R509 Fever, unspecified: Secondary | ICD-10-CM | POA: Diagnosis present

## 2020-02-29 DIAGNOSIS — R6889 Other general symptoms and signs: Secondary | ICD-10-CM

## 2020-02-29 DIAGNOSIS — U071 COVID-19: Secondary | ICD-10-CM | POA: Insufficient documentation

## 2020-02-29 LAB — GROUP A STREP BY PCR: Group A Strep by PCR: NOT DETECTED

## 2020-02-29 MED ORDER — ACETAMINOPHEN 325 MG PO TABS
650.0000 mg | ORAL_TABLET | Freq: Once | ORAL | Status: AC | PRN
  Administered 2020-02-29: 650 mg via ORAL
  Filled 2020-02-29: qty 2

## 2020-02-29 NOTE — ED Triage Notes (Signed)
Pt arrives with c/o fever, chills, and body aches X4 days. Denies being around anyone with Covid, pt is not vaccinated.

## 2020-02-29 NOTE — ED Provider Notes (Signed)
[facility] Provider Note   CSN: [id number] Arrival date & time: 02/29/20  1053     History Chief Complaint  Patient presents with   Chills    Emily Cantu is a 25 y.o. female.  HPI   Patient with no significant medical history presents to the emergency department chief complaint of sore throat, fatigue and fever.  Patient states symptoms started proximately 4 days ago and has stayed consistent.  Patient endorses that she does have a bit of a scratchy throat but denies ear pain, nasal congestion, chest pain, or shortness of breath.  Patient denies recent sick contacts, is not immunocompromise, has not received her Covid vaccine.  She endorse that she works in a [employer] with a lot of other individuals.  She has not taken any medication denies alleviating factors.  Patient denies cardiac history, history of PEs, DVTs, not on hormone therapy, denies leg pain leg swelling, recent trauma or surgeries.  Patient denies headaches, chest pain, shortness of breath, abdominal pain, nausea, vomiting, diarrhea, pedal edema.  Past Medical History:  Diagnosis Date   Abortion    Back pain affecting pregnancy    Depression    Scoliosis    Subchorionic hematoma     Patient Active Problem List   Diagnosis Date Noted   Chlamydia infection affecting pregnancy in third trimester 06/18/2019   Supervision of other normal pregnancy, antepartum 11/01/2018   Subchorionic hematoma in first trimester 11/01/2018   Vaginal delivery 12/24/2016    Past Surgical History:  Procedure Laterality Date   NO PAST SURGERIES     THERAPEUTIC ABORTION  05/28/2018     OB History    Gravida  3   Para  2   Term  2   Preterm      AB  1   Living  2     SAB      IAB  1   Ectopic      Multiple  0   Live Births  2           Family History  Problem Relation Age of Onset   Hypertension Mother    Cancer Father    Hypertension Maternal Grandmother      Social History   Tobacco Use   Smoking status: Never Smoker   Smokeless tobacco: Never Used  Vaping Use   Vaping Use: Never used  Substance Use Topics   Alcohol use: No   Drug use: No    Home Medications Prior to Admission medications   Medication Sig Start Date End Date Taking? Authorizing Provider  acetaminophen (TYLENOL) 325 MG tablet Take 2 tablets (650 mg total) by mouth every 6 (six) hours as needed (for pain scale < 4). Patient not taking: Reported on 09/26/2019 06/20/19   Chauncey Mann, MD  AMBULATORY NON FORMULARY MEDICATION 1 Device by Other route once a week. Blood pressure cuff/Medium Monitored Regularly at home. ICD 10: Z34.90 LROB Patient not taking: Reported on 09/26/2019 11/01/18   Seabron Spates, CNM  ibuprofen (ADVIL) 600 MG tablet Take 1 tablet (600 mg total) by mouth every 6 (six) hours. Patient not taking: Reported on 09/26/2019 06/20/19   Chauncey Mann, MD  norethindrone (MICRONOR) 0.35 MG tablet Take 1 tablet (0.35 mg total) by mouth daily. Patient not taking: Reported on 09/26/2019 06/20/19   Chauncey Mann, MD  norgestimate-ethinyl estradiol (ORTHO-CYCLEN) 0.25-35 MG-MCG tablet Take 1 tablet by mouth daily. Patient not taking: Reported on 10/06/2019  09/26/19   Aviva Signs, CNM  Prenatal Vit-Fe Fumarate-FA (PRENATAL VITAMINS PO) Take by mouth. Patient not taking: Reported on 09/26/2019    [provider]  senna-docusate (SENOKOT-S) 8.6-50 MG tablet Take 2 tablets by mouth daily. Patient not taking: Reported on 09/26/2019 06/21/19   Joselyn Arrow, MD  terconazole (TERAZOL 7) 0.4 % vaginal cream Place 1 applicator vaginally at bedtime. Patient not taking: Reported on 10/06/2019 09/26/19   Aviva Signs, CNM  ferrous sulfate (FERROUSUL) 325 (65 FE) MG tablet Take 1 tablet (325 mg total) by mouth 2 (two) times daily. 07/28/18 07/13/19  Levie Heritage, DO  misoprostol (CYTOTEC) 200 MCG tablet Take 1 tablet (200 mcg total) by mouth 3 (three) times  daily. 07/17/18 07/13/19  Raelyn Mora, CNM    Allergies    Patient has no known allergies.  Review of Systems   Review of Systems  Constitutional: Positive for fatigue. Negative for chills and fever.  HENT: Positive for sore throat. Negative for congestion.   Respiratory: Positive for chest tightness. Negative for shortness of breath.   Cardiovascular: Negative for chest pain.  Gastrointestinal: Negative for abdominal pain.  Genitourinary: Negative for dysuria and enuresis.  Musculoskeletal: Negative for back pain.  Skin: Negative for rash.  Neurological: Negative for dizziness and headaches.  Hematological: Does not bruise/bleed easily.    Physical Exam Updated Vital Signs BP 109/63 (BP Location: Left Arm)    Pulse 96    Temp 98.5 F (36.9 C) (Oral)    Resp 16    Ht 5\' 3"  (1.6 m)    Wt 66.4 kg    LMP 02/06/2020    SpO2 100%    BMI 25.92 kg/m   Physical Exam Vitals and nursing note reviewed.  Constitutional:      General: She is not in acute distress.    Appearance: She is not ill-appearing.  HENT:     Head: Normocephalic and atraumatic.     Right Ear: Tympanic membrane, ear canal and external ear normal.     Left Ear: Tympanic membrane, ear canal and external ear normal.     Nose: No congestion.     Comments: No nasal congestion present, no erythematous turbinates.    Mouth/Throat:     Mouth: Mucous membranes are moist.     Pharynx: Oropharynx is clear. No oropharyngeal exudate or posterior oropharyngeal erythema.  Eyes:     Conjunctiva/sclera: Conjunctivae normal.  Cardiovascular:     Rate and Rhythm: Normal rate and regular rhythm.     Pulses: Normal pulses.     Heart sounds: No murmur heard. No friction rub. No gallop.   Pulmonary:     Effort: No respiratory distress.     Breath sounds: No wheezing, rhonchi or rales.  Abdominal:     Palpations: Abdomen is soft.     Tenderness: There is no abdominal tenderness.  Musculoskeletal:     Right lower leg: No  edema.     Left lower leg: No edema.     Comments: Since moving all 4 extremities at difficulty.  Skin:    General: Skin is warm and dry.  Neurological:     Mental Status: She is alert.  Psychiatric:        Mood and Affect: Mood normal.     ED Results / Procedures / Treatments   Labs (all labs ordered are listed, but only abnormal results are displayed) Labs Reviewed  GROUP A STREP BY PCR  SARS CORONAVIRUS 2 (  TAT 6-24 HRS)    EKG EKG Interpretation  Date/Time:  Thursday February 29 2020 11:11:14 EST Ventricular Rate:  101 PR Interval:  156 QRS Duration: 88 QT Interval:  356 QTC Calculation: 461 R Axis:   26 Text Interpretation: Sinus tachycardia Nonspecific T wave abnormality Abnormal ECG Since last tracing Nonspecific T wave abnormality NOW PRESENT Confirmed by Susy Frizzle [phone]) on 02/29/2020 12:16:55 PM   Radiology DG Chest Portable 1 View  Result Date: 02/29/2020 CLINICAL DATA:  Fever.  Body aches. EXAM: PORTABLE CHEST 1 VIEW COMPARISON:  04/19/2017. FINDINGS: Mediastinum and hilar structures normal. Heart size normal. Lungs are clear. No pleural effusion or pneumothorax. Thoracic spine scoliosis again noted. No acute bony abnormality. IMPRESSION: No acute cardiopulmonary disease. Electronically Signed   By: Maisie Fus  Register   On: 02/29/2020 11:40    Procedures Procedures (including critical care time)  Medications Ordered in ED Medications  acetaminophen (TYLENOL) tablet 650 mg (650 mg Oral Given 02/29/20 1124)    ED Course  I have reviewed the triage vital signs and the nursing notes.  Pertinent labs & imaging results that were available during my care of the patient were reviewed by me and considered in my medical decision making (see chart for details).    MDM Rules/Calculators/A&P                          Patient presents with URI-like symptoms.  She is alert, does not appear in acute distress, vital signs reassuring will obtain chest x-ray, EKG, strep  and.  Strep test negative, Chest x-ray does not reveal any acute findings.  EKG sinus tach without signs of ischemia no ST elevation depression noted.  Low suspicion for systemic infection as patient is nontoxic-appearing, vital signs reassuring, no obvious source infection noted on exam.  Low suspicion for pneumonia as lung sounds are clear bilaterally, x-ray did not reveal any acute findings.  I have low suspicion for PE as patient denies pleuritic chest pain, shortness of breath, patient has low risk factors.  Patient is noted to be tachycardic upon arrival but I suspect this is secondary due to fever as it resolved when patient was afebrile. low suspicion for strep throat as oropharynx was visualized, no erythema or exudates noted strep test negative.  Low suspicion patient would need  hospitalized due to viral infection or Covid as vital signs reassuring, patient is not in respiratory distress.  I suspect patient suffering from viral infection, will recommend over-the-counter pain medications, and follow-up with post Covid care if Covid positive.  If Covid negative she can follow-up with her PCP for further evaluation on exam.  Vital signs have remained stable, no indication for hospital admission. Patient given at home care as well strict return precautions.  Patient verbalized that they understood agreed to said plan.   Final Clinical Impression(s) / ED Diagnoses Final diagnoses:  Flu-like symptoms    Rx / DC Orders ED Discharge Orders    None       Carroll Sage, PA-C 02/29/20 1559    Pollyann Savoy, MD 03/05/20 [phone]

## 2020-02-29 NOTE — Discharge Instructions (Signed)
You have been seen here for URI like symptoms.  I recommend taking Tylenol for fever control and ibuprofen for pain control please follow dosing on the back of bottle.  I recommend staying hydrated and if you do not an appetite, I recommend soups as this will provide you with fluids and calories.  Your Covid test is pending I recommend self quarantine until you get your results back on MyChart.    If you are Covid positive you must self quarantine for 5 days starting on symptom onset, if at the end of those 5 days you are feeling better you may return back to school/work, if you continue to have symptoms you must self quarantine for additional 5 days.  I would like you to contact "post Covid care" as they will provide you with information how to manage your Covid symptoms if you are Covid positive.  Or if you are Covid negative and continue of symptoms after 1 to 2 weeks you may follow-up with your primary care provider  Come back to the emergency department if you develop chest pain, shortness of breath, severe abdominal pain, uncontrolled nausea, vomiting, diarrhea.  

## 2020-03-01 LAB — SARS CORONAVIRUS 2 (TAT 6-24 HRS): SARS Coronavirus 2: POSITIVE — AB

## 2020-03-08 ENCOUNTER — Ambulatory Visit: Payer: Medicaid Other

## 2020-03-12 ENCOUNTER — Other Ambulatory Visit (HOSPITAL_COMMUNITY)
Admission: RE | Admit: 2020-03-12 | Discharge: 2020-03-12 | Disposition: A | Discharge status: Home / Self Care | Payer: Medicaid Other | Source: Ambulatory Visit | Attending: Obstetrics & Gynecology | Admitting: Obstetrics & Gynecology

## 2020-03-12 ENCOUNTER — Other Ambulatory Visit: Payer: Self-pay

## 2020-03-12 ENCOUNTER — Ambulatory Visit: Payer: Medicaid Other

## 2020-03-12 VITALS — BP 130/67 | HR 53 | Wt 144.0 lb

## 2020-03-12 DIAGNOSIS — N898 Other specified noninflammatory disorders of vagina: Secondary | ICD-10-CM

## 2020-03-12 DIAGNOSIS — B379 Candidiasis, unspecified: Secondary | ICD-10-CM

## 2020-03-12 MED ORDER — FLUCONAZOLE 150 MG PO TABS: 150.0000 mg | ORAL_TABLET | Freq: Once | ORAL | 0 refills | Status: AC

## 2020-03-12 NOTE — Progress Notes (Signed)
Pt presents with itching and vaginal irritation and some odor x 5 days. Pt requests a Rx for vaginal itching. Diflucan was sent to pt's pharmacy.  Haddy Mullinax l Denisa Enterline, CMA

## 2020-03-14 LAB — CERVICOVAGINAL ANCILLARY ONLY
Bacterial Vaginitis (gardnerella): POSITIVE — AB
Candida Glabrata: NEGATIVE
Candida Vaginitis: NEGATIVE
Chlamydia: NEGATIVE
Comment: NEGATIVE
Comment: NEGATIVE
Comment: NEGATIVE
Comment: NEGATIVE
Comment: NEGATIVE
Comment: NORMAL
Neisseria Gonorrhea: NEGATIVE
Trichomonas: NEGATIVE

## 2020-03-15 ENCOUNTER — Telehealth: Payer: Self-pay

## 2020-03-15 DIAGNOSIS — B9689 Other specified bacterial agents as the cause of diseases classified elsewhere: Secondary | ICD-10-CM

## 2020-03-15 MED ORDER — METRONIDAZOLE 500 MG PO TABS: 500.0000 mg | ORAL_TABLET | Freq: Two times a day (BID) | ORAL | 0 refills | Status: DC

## 2020-03-15 NOTE — Telephone Encounter (Signed)
Patient called and made aware of results of bacterial vaginosis. Patient made aware that we should treat with Flagyl twice a day for seven day. Patient states understanding and pharmacy verified. Patient states she had been using a new kind of vaginal wipe and told her to discontinue these as it could be the cause for the BV. Armandina Stammer RN

## 2020-03-15 NOTE — Telephone Encounter (Signed)
Called pt to discuss positive BV results. Flagyl 500 mg BID x 7 days was sent to her pharmacy. Pt made aware that she should avoid alcohol while taking Flagyl as it can make her sick. Understanding was voiced.  Phoenix Riesen l Prisha Hiley, CMA

## 2020-03-15 NOTE — Telephone Encounter (Signed)
-----   Message from Jerene Bears, MD sent at 03/15/2020  6:54 AM EST ----- Pt came for self swab due to vaginal discharge/odor.  She was given diflucan.  Please let her know the testing showed BV.  Ok to treat with metronidazole 500mg  bid x 7 days or Metrogel 0.75%, one applicator nightly x 5 nights.  Metronidazole is likely less expensive.  Thank you.

## 2020-08-23 ENCOUNTER — Other Ambulatory Visit: Payer: Self-pay

## 2020-08-23 ENCOUNTER — Other Ambulatory Visit (HOSPITAL_COMMUNITY)
Admission: RE | Admit: 2020-08-23 | Discharge: 2020-08-23 | Disposition: A | Discharge status: Home / Self Care | Payer: Medicaid Other | Source: Ambulatory Visit | Attending: Family Medicine | Admitting: Family Medicine

## 2020-08-23 ENCOUNTER — Ambulatory Visit: Payer: Medicaid Other

## 2020-08-23 VITALS — BP 99/47 | Ht 63.0 in | Wt 148.0 lb

## 2020-08-23 DIAGNOSIS — Z113 Encounter for screening for infections with a predominantly sexual mode of transmission: Secondary | ICD-10-CM

## 2020-08-23 DIAGNOSIS — N898 Other specified noninflammatory disorders of vagina: Secondary | ICD-10-CM | POA: Insufficient documentation

## 2020-08-23 NOTE — Progress Notes (Signed)
SUBJECTIVE:  25 y.o. female complains of malodorous vaginal discharge for 3 day(s). Denies abnormal vaginal bleeding or significant pelvic pain or fever. No UTI symptoms. Denies history of known exposure to STD.  LMP: 07/27/20  OBJECTIVE:  She appears well, afebrile. Urine dipstick: not done.  ASSESSMENT:  Vaginal Discharge  Vaginal Odor   PLAN:  GC, chlamydia, trichomonas, BVAG, CVAG probe sent to lab. Treatment: To be determined once lab results are received ROV prn if symptoms persist or worsen.

## 2020-08-27 LAB — CERVICOVAGINAL ANCILLARY ONLY
Bacterial Vaginitis (gardnerella): POSITIVE — AB
Candida Glabrata: NEGATIVE
Candida Vaginitis: NEGATIVE
Chlamydia: NEGATIVE
Comment: NEGATIVE
Comment: NEGATIVE
Comment: NEGATIVE
Comment: NEGATIVE
Comment: NEGATIVE
Comment: NORMAL
Neisseria Gonorrhea: NEGATIVE
Trichomonas: NEGATIVE

## 2020-08-28 ENCOUNTER — Other Ambulatory Visit: Payer: Self-pay

## 2020-08-28 DIAGNOSIS — B9689 Other specified bacterial agents as the cause of diseases classified elsewhere: Secondary | ICD-10-CM

## 2020-08-28 MED ORDER — METRONIDAZOLE 500 MG PO TABS: 500.0000 mg | ORAL_TABLET | Freq: Two times a day (BID) | ORAL | 0 refills | Status: DC

## 2020-08-28 NOTE — Progress Notes (Signed)
Pt swab came back showing bacterial vaginosis. Rx sent in for flagyl 500mg  1 po BID for 7 days. MyChart message sent to patient.

## 2020-10-02 ENCOUNTER — Ambulatory Visit: Payer: Medicaid Other | Admitting: Family Medicine

## 2020-10-16 ENCOUNTER — Ambulatory Visit: Payer: Medicaid Other | Admitting: Family Medicine

## 2020-10-16 ENCOUNTER — Other Ambulatory Visit: Payer: Self-pay

## 2020-10-16 ENCOUNTER — Ambulatory Visit (INDEPENDENT_AMBULATORY_CARE_PROVIDER_SITE_OTHER): Payer: Medicaid Other

## 2020-10-16 DIAGNOSIS — Z3200 Encounter for pregnancy test, result unknown: Secondary | ICD-10-CM

## 2020-10-16 LAB — POCT URINE PREGNANCY: Preg Test, Ur: POSITIVE — AB

## 2020-10-16 NOTE — Progress Notes (Signed)
Emily Cantu presents today for UPT. She has no unusual complaints. LMP: 09/16/2020    OBJECTIVE: Appears well, in no apparent distress.  OB History     Gravida  3   Para  2   Term  2   Preterm      AB  1   Living  2      SAB      IAB  1   Ectopic      Multiple  0   Live Births  2          Home UPT Result: unknown In-Office UPT result:positive I have reviewed the patient's medical, obstetrical, social, and family histories, and medications.   ASSESSMENT: Positive pregnancy test  PLAN Prenatal care to be completed at: Will [facility] care at [facility] around 10 weeks.

## 2020-10-17 ENCOUNTER — Inpatient Hospital Stay (HOSPITAL_COMMUNITY)
Admission: AD | Admit: 2020-10-17 | Discharge: 2020-10-17 | Disposition: A | Discharge status: Home / Self Care | Payer: Medicaid Other | Attending: Obstetrics and Gynecology | Admitting: Obstetrics and Gynecology

## 2020-10-17 ENCOUNTER — Encounter (HOSPITAL_COMMUNITY): Payer: Self-pay | Admitting: Obstetrics and Gynecology

## 2020-10-17 ENCOUNTER — Telehealth: Payer: Self-pay

## 2020-10-17 ENCOUNTER — Ambulatory Visit: Payer: Medicaid Other | Admitting: Obstetrics and Gynecology

## 2020-10-17 ENCOUNTER — Other Ambulatory Visit: Payer: Self-pay

## 2020-10-17 ENCOUNTER — Inpatient Hospital Stay (HOSPITAL_COMMUNITY): Payer: Medicaid Other

## 2020-10-17 DIAGNOSIS — R109 Unspecified abdominal pain: Secondary | ICD-10-CM | POA: Insufficient documentation

## 2020-10-17 DIAGNOSIS — Z3A00 Weeks of gestation of pregnancy not specified: Secondary | ICD-10-CM | POA: Insufficient documentation

## 2020-10-17 DIAGNOSIS — N939 Abnormal uterine and vaginal bleeding, unspecified: Secondary | ICD-10-CM | POA: Diagnosis not present

## 2020-10-17 DIAGNOSIS — Z3202 Encounter for pregnancy test, result negative: Secondary | ICD-10-CM | POA: Diagnosis not present

## 2020-10-17 DIAGNOSIS — R9349 Abnormal radiologic findings on diagnostic imaging of other urinary organs: Secondary | ICD-10-CM | POA: Insufficient documentation

## 2020-10-17 DIAGNOSIS — O209 Hemorrhage in early pregnancy, unspecified: Secondary | ICD-10-CM | POA: Diagnosis present

## 2020-10-17 LAB — CBC
HCT: 41.6 % (ref 36.0–46.0)
Hemoglobin: 13.9 g/dL (ref 12.0–15.0)
MCH: 29.3 pg (ref 26.0–34.0)
MCHC: 33.4 g/dL (ref 30.0–36.0)
MCV: 87.8 fL (ref 80.0–100.0)
Platelets: 282 10*3/uL (ref 150–400)
RBC: 4.74 MIL/uL (ref 3.87–5.11)
RDW: 12.7 % (ref 11.5–15.5)
WBC: 4.5 10*3/uL (ref 4.0–10.5)
nRBC: 0 % (ref 0.0–0.2)

## 2020-10-17 LAB — URINALYSIS, ROUTINE W REFLEX MICROSCOPIC
Bacteria, UA: NONE SEEN
Bilirubin Urine: NEGATIVE
Glucose, UA: NEGATIVE mg/dL
Ketones, ur: 5 mg/dL — AB
Leukocytes,Ua: NEGATIVE
Nitrite: NEGATIVE
Protein, ur: NEGATIVE mg/dL
RBC / HPF: >50 RBC/hpf — ABNORMAL HIGH (ref 0–5)
Specific Gravity, Urine: 1.021 (ref 1.005–1.030)
pH: 6 (ref 5.0–8.0)

## 2020-10-17 LAB — WET PREP, GENITAL
Clue Cells Wet Prep HPF POC: NONE SEEN
Sperm: NONE SEEN
Trich, Wet Prep: NONE SEEN
Yeast Wet Prep HPF POC: NONE SEEN

## 2020-10-17 LAB — HIV ANTIBODY (ROUTINE TESTING W REFLEX): HIV Screen 4th Generation wRfx: NONREACTIVE

## 2020-10-17 LAB — HCG, QUANTITATIVE, PREGNANCY: hCG, Beta Chain, Quant, S: <1 m[IU]/mL (ref <5)

## 2020-10-17 NOTE — MAU Provider Note (Signed)
Chief Complaint: Vaginal Bleeding and Abdominal Pain   Event Date/Time   First Provider Initiated Contact with Patient 10/17/20 1952      SUBJECTIVE HPI: Emily Cantu is a 25 y.o. [id number] at Unknown [place] who presents to maternity admissions reporting positive pregnancy test at [facility] yesterday with onset of menstrual like bleeding last night.  She called the office and was told to come to MAU given the bleeding in early pregnancy. She denies any pain. There are no other symptoms. She has not tried any treatments.   HPI  Past Medical History:  Diagnosis Date   Abortion    Back pain affecting pregnancy    Depression    Scoliosis    Subchorionic hematoma    Past Surgical History:  Procedure Laterality Date   NO PAST SURGERIES     THERAPEUTIC ABORTION  05/28/2018   Social History   Socioeconomic History   Marital status: Single    Spouse name: Not on file   Number of children: 1   Years of education: Not on file   Highest education level: Not on file  Occupational History   Not on file  Tobacco Use   Smoking status: Never   Smokeless tobacco: Never  Vaping Use   Vaping Use: Never used  Substance and Sexual Activity   Alcohol use: No   Drug use: No   Sexual activity: Yes    Birth control/protection: None  Other Topics Concern   Not on file  Social History Narrative   Not on file   Social Determinants of Health   Financial Resource Strain: Not on file  Food Insecurity: Not on file  Transportation Needs: Not on file  Physical Activity: Not on file  Stress: Not on file  Social Connections: Not on file  Intimate Partner Violence: Not on file   No current facility-administered medications on file prior to encounter.   Current Outpatient Medications on File Prior to Encounter  Medication Sig Dispense Refill   [DISCONTINUED] ferrous sulfate (FERROUSUL) 325 (65 FE) MG tablet Take 1 tablet (325 mg total) by mouth 2 (two) times daily. 60 tablet 1   [DISCONTINUED]  misoprostol (CYTOTEC) 200 MCG tablet Take 1 tablet (200 mcg total) by mouth 3 (three) times daily. 9 tablet 0   No Known Allergies  ROS:  Review of Systems   I have reviewed patient's Past Medical Hx, Surgical Hx, Family Hx, Social Hx, medications and allergies.   Physical Exam  Patient Vitals for the past 24 hrs:  BP Temp Temp src Pulse Resp SpO2 Height Weight  10/17/20 2124 119/65 -- -- (!) 45 -- -- -- --  10/17/20 1853 118/72 98.3 F (36.8 C) Oral (!) 50 18 100 % -- --  10/17/20 1846 -- -- -- -- -- -- 5\' 4"  (1.626 m) 68.4 kg   Constitutional: Well-developed, well-nourished female in no acute distress.  Cardiovascular: normal rate Respiratory: normal effort GI: Abd soft, non-tender. Pos BS x 4 MS: Extremities nontender, no edema, normal ROM Neurologic: Alert and oriented x 4.  GU: Neg CVAT.  PELVIC EXAM: Vaginal swabs collected by RN    LAB RESULTS Results for orders placed or performed during the hospital encounter of 10/17/20 (from the past 24 hour(s))  CBC     Status: None   Collection Time: 10/17/20  7:43 PM  Result Value Ref Range   WBC 4.5 4.0 - 10.5 K/uL   RBC 4.74 3.87 - 5.11 MIL/uL   Hemoglobin 13.9 12.0 -  15.0 g/dL   HCT 17.4 08.1 - 44.8 %   MCV 87.8 80.0 - 100.0 fL   MCH 29.3 26.0 - 34.0 pg   MCHC 33.4 30.0 - 36.0 g/dL   RDW 18.5 63.1 - 49.7 %   Platelets 282 150 - 400 K/uL   nRBC 0.0 0.0 - 0.2 %  hCG, quantitative, pregnancy     Status: None   Collection Time: 10/17/20  7:43 PM  Result Value Ref Range   hCG, Beta Chain, Quant, S <1 <5 mIU/mL  HIV Antibody (routine testing w rflx)     Status: None   Collection Time: 10/17/20  7:43 PM  Result Value Ref Range   HIV Screen 4th Generation wRfx Non Reactive Non Reactive  Wet prep, genital     Status: Abnormal   Collection Time: 10/17/20  8:04 PM  Result Value Ref Range   Yeast Wet Prep HPF POC NONE SEEN NONE SEEN   Trich, Wet Prep NONE SEEN NONE SEEN   Clue Cells Wet Prep HPF POC NONE SEEN NONE SEEN    WBC, Wet Prep HPF POC FEW (A) NONE SEEN   Sperm NONE SEEN   Urinalysis, Routine w reflex microscopic Urine, Clean Catch     Status: Abnormal   Collection Time: 10/17/20  8:22 PM  Result Value Ref Range   Color, Urine YELLOW YELLOW   APPearance HAZY (A) CLEAR   Specific Gravity, Urine 1.021 1.005 - 1.030   pH 6.0 5.0 - 8.0   Glucose, UA NEGATIVE NEGATIVE mg/dL   Hgb urine dipstick LARGE (A) NEGATIVE   Bilirubin Urine NEGATIVE NEGATIVE   Ketones, ur 5 (A) NEGATIVE mg/dL   Protein, ur NEGATIVE NEGATIVE mg/dL   Nitrite NEGATIVE NEGATIVE   Leukocytes,Ua NEGATIVE NEGATIVE   RBC / HPF >50 (H) 0 - 5 RBC/hpf   WBC, UA 0-5 0 - 5 WBC/hpf   Bacteria, UA NONE SEEN NONE SEEN   Squamous Epithelial / LPF 0-5 0 - 5   Mucus PRESENT        IMAGING US OB LESS THAN 14 WEEKS WITH OB TRANSVAGINAL  Result Date: 10/17/2020 CLINICAL DATA:  Vaginal bleeding, LMP 09/15/2020 EXAM: OBSTETRIC <14 WK [place] AND TRANSVAGINAL OB US TECHNIQUE: Both transabdominal and transvaginal ultrasound examinations were performed for complete evaluation of the gestation as well as the maternal uterus, adnexal regions, and pelvic cul-de-sac. Transvaginal technique was performed to assess early pregnancy. COMPARISON:  None. FINDINGS: Intrauterine gestational sac: None identified Maternal uterus/adnexae: The uterus is retroverted. No intrauterine masses are seen. The cervix is unremarkable. There is mild simple appearing free fluid within the pelvis surrounding the uterine fundus. The ovaries are normal in size and echogenicity bilaterally. Multiple follicles are seen. No adnexal masses are seen. IMPRESSION: Pregnancy location not visualized sonographically. Differential diagnosis includes recent spontaneous abortion, IUP too early to visualize, and non-visualized ectopic pregnancy. Recommend close follow up of quantitative B-HCG levels, and follow up US as clinically warranted. Electronically Signed   By: Helyn Numbers M.D.   On:  10/17/2020 20:29    MAU Management/MDM: Orders Placed This Encounter  Procedures   Wet prep, genital   US OB LESS THAN 14 WEEKS WITH OB TRANSVAGINAL   Urinalysis, Routine w reflex microscopic Urine, Clean Catch   CBC   hCG, quantitative, pregnancy   HIV Antibody (routine testing w rflx)   Discharge patient    No orders of the defined types were placed in this encounter.   [place] without evidence of  pregnancy.  Hcg resulted as <1.  Consult Dr Alysia Penna. Differential include very early pregnancy loss or false positive pregnancy test/error in office yesterday. F/U with hcg in office on Monday.  Precautions/reasons to seek emergency care reviewed.    ASSESSMENT 1. Negative pregnancy test   2. Vaginal bleeding in pregnancy, first trimester   3. Abnormal uterine bleeding (AUB)     PLAN Discharge home Allergies as of 10/17/2020   No Known Allergies      Medication List     STOP taking these medications    metroNIDAZOLE 500 MG tablet Commonly known as: FLAGYL   terconazole 0.4 % vaginal cream Commonly known as: TERAZOL 7        Follow-up Information     [facility] Follow up.   Why: The clinic will call you with appointment for Monday, 10/21/20. Contact information: [address] [place] [id number]                Sharen Counter Certified Nurse-Midwife 10/17/2020  10:09 PM

## 2020-10-17 NOTE — Telephone Encounter (Signed)
Pt had a positive UPT in the office yesterday.Pt called today stating she is having some bleeding. Pt advised to go [facility] to be seen. Understanding was voiced. Ayra Hodgdon l Treston Coker, CMA

## 2020-10-17 NOTE — MAU Note (Signed)
Presents stating she had a +UPT yesterday.  Started having VB last night, reports wearing sanitary napkin.  Endorses mild abdominal cramping.  Denies recent intercourse. LMP 09/15/2020, unsure of exact date.

## 2020-10-18 LAB — GC/CHLAMYDIA PROBE AMP (~~LOC~~) NOT AT ARMC
Chlamydia: NEGATIVE
Comment: NEGATIVE
Comment: NORMAL
Neisseria Gonorrhea: NEGATIVE

## 2020-10-21 ENCOUNTER — Other Ambulatory Visit: Payer: Medicaid Other

## 2020-11-06 ENCOUNTER — Telehealth: Payer: Self-pay

## 2020-11-06 NOTE — Telephone Encounter (Signed)
-----   Message from Marti Sleigh, [place] sent at 11/06/2020  1:26 PM EDT ----- Regarding: Rx Please call patient about a Rx for yeast.  The last time she was here was in August and she said that her Mychart is saying that she has a yeast infection.

## 2020-11-06 NOTE — Telephone Encounter (Signed)
Called pt to discuss results. Left message for pt to call the office back, Arelyn Gauer l Darrion Wyszynski, CMA

## 2020-11-07 ENCOUNTER — Telehealth: Payer: Self-pay

## 2020-11-07 DIAGNOSIS — B379 Candidiasis, unspecified: Secondary | ICD-10-CM

## 2020-11-07 MED ORDER — TERCONAZOLE 0.4 % VA CREA: TOPICAL_CREAM | VAGINAL | 0 refills | Status: DC

## 2020-11-29 ENCOUNTER — Ambulatory Visit: Payer: Medicaid Other | Admitting: Family Medicine

## 2020-12-02 ENCOUNTER — Other Ambulatory Visit: Payer: Self-pay

## 2020-12-02 ENCOUNTER — Other Ambulatory Visit (HOSPITAL_COMMUNITY)
Admission: RE | Admit: 2020-12-02 | Discharge: 2020-12-02 | Disposition: A | Discharge status: Home / Self Care | Payer: Medicaid Other | Source: Ambulatory Visit | Attending: Obstetrics & Gynecology | Admitting: Obstetrics & Gynecology

## 2020-12-02 ENCOUNTER — Ambulatory Visit (INDEPENDENT_AMBULATORY_CARE_PROVIDER_SITE_OTHER): Payer: Medicaid Other | Admitting: Obstetrics & Gynecology

## 2020-12-02 ENCOUNTER — Encounter: Payer: Self-pay | Admitting: Obstetrics & Gynecology

## 2020-12-02 VITALS — BP 104/57 | HR 44 | Ht 63.0 in | Wt 145.0 lb

## 2020-12-02 DIAGNOSIS — N898 Other specified noninflammatory disorders of vagina: Secondary | ICD-10-CM

## 2020-12-02 DIAGNOSIS — Z3009 Encounter for other general counseling and advice on contraception: Secondary | ICD-10-CM

## 2020-12-02 DIAGNOSIS — Z01419 Encounter for gynecological examination (general) (routine) without abnormal findings: Secondary | ICD-10-CM | POA: Diagnosis not present

## 2020-12-02 NOTE — Patient Instructions (Signed)

## 2020-12-02 NOTE — Progress Notes (Signed)
Subjective:     Emily Cantu is a 25 y.o. female here for a routine exam.  Current complaints: pt reports vaginal irritation. Uses Dove unscented soap. Does not douche or use other agents. Currently sexually active. No contraception.monogamous x 5 years.. Female partner. Pt reports taht she    Gynecologic History Patient's last menstrual period was 10/16/2020 (approximate). Contraception: condoms intermittent Last Pap: no results.  Last mammogram: n/a.   Obstetric History OB History  Gravida Para Term Preterm AB Living  4 2 2   1 2   SAB IAB Ectopic Multiple Live Births    1   0 2    # Outcome Date GA Lbr Len/2nd Weight Sex Delivery Anes PTL Lv  4 Gravida           3 Term 06/18/19 [redacted]w[redacted]d 09:15 / 00:12 6 lb 8.1 oz (2.951 kg) M Vag-Spont EPI  LIV  2 IAB 05/28/18          1 Term 12/25/16 [redacted]w[redacted]d 11:34 / 01:02 6 lb 8.8 oz (2.971 kg) M Vag-Spont EPI  LIV    The following portions of the patient's history were reviewed and updated as appropriate: allergies, current medications, past family history, past medical history, past social history, past surgical history, and problem list.  Review of Systems Pertinent items are noted in HPI.    Objective:  BP (!) 104/57   Pulse (!) 44   Ht 5\' 3"  (1.6 m)   Wt 145 lb (65.8 kg)   LMP 10/16/2020 (Approximate)   Breastfeeding Unknown   BMI 25.69 kg/m   General Appearance:    Alert, cooperative, no distress, appears stated age  Head:    Normocephalic, without obvious abnormality, atraumatic  Eyes:    conjunctiva/corneas clear, EOM's intact, both eyes  Ears:    Normal external ear canals, both ears  Nose:   Nares normal, septum midline, mucosa normal, no drainage    or sinus tenderness  Throat:   Lips, mucosa, and tongue normal; teeth and gums normal  Neck:   Supple, symmetrical, trachea midline, no adenopathy;    thyroid:  no enlargement/tenderness/nodules  Back:     Symmetric, no curvature, ROM normal, no CVA tenderness  Lungs:      respirations unlabored  Chest Wall:    No tenderness or deformity   Heart:    Regular rate and rhythm  Breast Exam:    No tenderness, masses, or nipple abnormality  Abdomen:     Soft, non-tender, bowel sounds active all four quadrants,    no masses, no organomegaly  Genitalia:    Normal female without lesion, discharge or tenderness   There is a thick white discharge. I am not sure if this is physiologic or not.   Extremities:   Extremities normal, atraumatic, no cyanosis or edema  Pulses:   2+ and symmetric all extremities  Skin:   Skin color, texture, turgor normal, no rashes or lesions     Assessment:    Healthy female exam.  Reviewed all forms of birth control options available including abstinence; over the counter/barrier methods; hormonal contraceptive medication including pill, patch, ring, injection,contraceptive implant; hormonal and nonhormonal IUDs; permanent sterilization options including vasectomy and the various tubal sterilization modalities. Risks and benefits reviewed.  Questions were answered.  Information was given to patient to review.  Pt would like to read on IUDs and will f/u prn   Plan:  Emily Cantu was seen today for gynecologic exam.  Diagnoses and all orders for  this visit:  Well female exam with routine gynecological exam -     Cytology - PAP( [place]) -     HIV antibody (with reflex) -     Hepatitis C Antibody -     Hepatitis B Surface AntiGEN -     RPR  Vaginal discharge -     Cervicovaginal ancillary only( [place])  Encounter for counseling regarding contraception   F/u in 1 year or sooner prn   Emily Cantu L. Harraway-Smith, M.D., [place]

## 2020-12-03 LAB — CERVICOVAGINAL ANCILLARY ONLY
Bacterial Vaginitis (gardnerella): NEGATIVE
Candida Glabrata: NEGATIVE
Candida Vaginitis: NEGATIVE
Chlamydia: NEGATIVE
Comment: NEGATIVE
Comment: NEGATIVE
Comment: NEGATIVE
Comment: NEGATIVE
Comment: NEGATIVE
Comment: NORMAL
Neisseria Gonorrhea: NEGATIVE
Trichomonas: NEGATIVE

## 2020-12-03 LAB — CYTOLOGY - PAP: Diagnosis: NEGATIVE

## 2020-12-03 LAB — HIV ANTIBODY (ROUTINE TESTING W REFLEX): HIV Screen 4th Generation wRfx: NONREACTIVE

## 2020-12-03 LAB — HEPATITIS B SURFACE ANTIGEN: Hepatitis B Surface Ag: NEGATIVE

## 2020-12-03 LAB — HEPATITIS C ANTIBODY: Hep C Virus Ab: <0.1 s/co ratio (ref 0.0–0.9)

## 2020-12-03 LAB — RPR: RPR Ser Ql: NONREACTIVE

## 2020-12-10 ENCOUNTER — Other Ambulatory Visit (HOSPITAL_COMMUNITY)
Admission: RE | Admit: 2020-12-10 | Discharge: 2020-12-10 | Disposition: A | Discharge status: Home / Self Care | Payer: Medicaid Other | Source: Ambulatory Visit | Attending: Advanced Practice Midwife | Admitting: Advanced Practice Midwife

## 2020-12-10 ENCOUNTER — Other Ambulatory Visit: Payer: Self-pay

## 2020-12-10 ENCOUNTER — Ambulatory Visit: Payer: Medicaid Other

## 2020-12-10 DIAGNOSIS — N949 Unspecified condition associated with female genital organs and menstrual cycle: Secondary | ICD-10-CM

## 2020-12-10 DIAGNOSIS — R300 Dysuria: Secondary | ICD-10-CM

## 2020-12-10 DIAGNOSIS — N9489 Other specified conditions associated with female genital organs and menstrual cycle: Secondary | ICD-10-CM

## 2020-12-10 NOTE — Progress Notes (Addendum)
SUBJECTIVE: Emily Cantu is a 25 y.o. female who complains of burning sensation  x 3 days, without flank pain, fever, chills, or abnormal vaginal discharge or bleeding. Patient states sensation just happens randomly- not just with urination. Patient request vaginitis and STD self swab screening.  OBJECTIVE: Appears well, in no apparent distress.  Vital signs are normal. Urine dipstick shows positive for leukocytes and blood.    ASSESSMENT: Dysuria  PLAN: Treatment per orders.  Call or return to clinic prn if these symptoms worsen or fail to improve as anticipated.   Attestation of Attending Supervision of RN: Evaluation and management procedures were performed by the nurse under my supervision and collaboration.  I have reviewed the nursing note and chart, and I agree with the management and plan.  Carolyn L. Harraway-Smith, M.D., [place]

## 2020-12-12 LAB — CERVICOVAGINAL ANCILLARY ONLY
Bacterial Vaginitis (gardnerella): NEGATIVE
Candida Glabrata: NEGATIVE
Candida Vaginitis: NEGATIVE
Chlamydia: NEGATIVE
Comment: NEGATIVE
Comment: NEGATIVE
Comment: NEGATIVE
Comment: NEGATIVE
Comment: NEGATIVE
Comment: NORMAL
Neisseria Gonorrhea: NEGATIVE
Trichomonas: NEGATIVE

## 2020-12-13 ENCOUNTER — Other Ambulatory Visit: Payer: Self-pay

## 2020-12-13 DIAGNOSIS — B379 Candidiasis, unspecified: Secondary | ICD-10-CM

## 2020-12-13 LAB — URINE CULTURE

## 2020-12-13 MED ORDER — FLUCONAZOLE 150 MG PO TABS: ORAL_TABLET | ORAL | 1 refills | Status: DC

## 2020-12-13 NOTE — Progress Notes (Signed)
Pt sent Mychart message stating she is having some vaginal irritation with white clumpy discharge. Diflucan 150 mg PO was sent to her pharmacy. Anuradha Chabot l Norvell Ureste, CMA

## 2021-01-27 ENCOUNTER — Inpatient Hospital Stay (HOSPITAL_COMMUNITY): Payer: Medicaid Other

## 2021-01-27 ENCOUNTER — Other Ambulatory Visit: Payer: Self-pay

## 2021-01-27 ENCOUNTER — Encounter (HOSPITAL_COMMUNITY): Payer: Self-pay | Admitting: Obstetrics and Gynecology

## 2021-01-27 ENCOUNTER — Inpatient Hospital Stay (HOSPITAL_COMMUNITY)
Admission: AD | Admit: 2021-01-27 | Discharge: 2021-01-28 | Disposition: A | Discharge status: Home / Self Care | Payer: Medicaid Other | Attending: Obstetrics and Gynecology | Admitting: Obstetrics and Gynecology

## 2021-01-27 DIAGNOSIS — R102 Pelvic and perineal pain: Secondary | ICD-10-CM | POA: Insufficient documentation

## 2021-01-27 DIAGNOSIS — Z3201 Encounter for pregnancy test, result positive: Secondary | ICD-10-CM | POA: Insufficient documentation

## 2021-01-27 DIAGNOSIS — O210 Mild hyperemesis gravidarum: Secondary | ICD-10-CM | POA: Diagnosis present

## 2021-01-27 DIAGNOSIS — O26899 Other specified pregnancy related conditions, unspecified trimester: Secondary | ICD-10-CM

## 2021-01-27 DIAGNOSIS — O26891 Other specified pregnancy related conditions, first trimester: Secondary | ICD-10-CM | POA: Diagnosis not present

## 2021-01-27 DIAGNOSIS — Z3A01 Less than 8 weeks gestation of pregnancy: Secondary | ICD-10-CM | POA: Diagnosis not present

## 2021-01-27 DIAGNOSIS — R109 Unspecified abdominal pain: Secondary | ICD-10-CM

## 2021-01-27 DIAGNOSIS — E860 Dehydration: Secondary | ICD-10-CM

## 2021-01-27 DIAGNOSIS — Z3491 Encounter for supervision of normal pregnancy, unspecified, first trimester: Secondary | ICD-10-CM

## 2021-01-27 LAB — CBC
HCT: 41.5 % (ref 36.0–46.0)
Hemoglobin: 13.9 g/dL (ref 12.0–15.0)
MCH: 29.1 pg (ref 26.0–34.0)
MCHC: 33.5 g/dL (ref 30.0–36.0)
MCV: 86.8 fL (ref 80.0–100.0)
Platelets: 271 10*3/uL (ref 150–400)
RBC: 4.78 MIL/uL (ref 3.87–5.11)
RDW: 13.2 % (ref 11.5–15.5)
WBC: 4.6 10*3/uL (ref 4.0–10.5)
nRBC: 0 % (ref 0.0–0.2)

## 2021-01-27 LAB — HCG, QUANTITATIVE, PREGNANCY: hCG, Beta Chain, Quant, S: 56466 m[IU]/mL — ABNORMAL HIGH (ref <5)

## 2021-01-27 LAB — COMPREHENSIVE METABOLIC PANEL
ALT: 18 U/L (ref 0–44)
AST: 28 U/L (ref 15–41)
Albumin: 3.9 g/dL (ref 3.5–5.0)
Alkaline Phosphatase: 58 U/L (ref 38–126)
Anion gap: 10 (ref 5–15)
BUN: <5 mg/dL — ABNORMAL LOW (ref 6–20)
CO2: 25 mmol/L (ref 22–32)
Calcium: 9.9 mg/dL (ref 8.9–10.3)
Chloride: 101 mmol/L (ref 98–111)
Creatinine, Ser: 0.68 mg/dL (ref 0.44–1.00)
GFR, Estimated: >60 mL/min (ref >60)
Glucose, Bld: 87 mg/dL (ref 70–99)
Potassium: 3.7 mmol/L (ref 3.5–5.1)
Sodium: 136 mmol/L (ref 135–145)
Total Bilirubin: 0.3 mg/dL (ref 0.3–1.2)
Total Protein: 7.5 g/dL (ref 6.5–8.1)

## 2021-01-27 LAB — WET PREP, GENITAL
Sperm: NONE SEEN
Trich, Wet Prep: NONE SEEN
WBC, Wet Prep HPF POC: <10 (ref <10)
Yeast Wet Prep HPF POC: NONE SEEN

## 2021-01-27 LAB — POCT PREGNANCY, URINE: Preg Test, Ur: POSITIVE — AB

## 2021-01-27 LAB — URINALYSIS, ROUTINE W REFLEX MICROSCOPIC
Bilirubin Urine: NEGATIVE
Glucose, UA: NEGATIVE mg/dL
Hgb urine dipstick: NEGATIVE
Ketones, ur: 15 mg/dL — AB
Leukocytes,Ua: NEGATIVE
Nitrite: NEGATIVE
Protein, ur: NEGATIVE mg/dL
Specific Gravity, Urine: 1.015 (ref 1.005–1.030)
pH: 6.5 (ref 5.0–8.0)

## 2021-01-27 MED ORDER — LACTATED RINGERS IV BOLUS
1000.0000 mL | Freq: Once | INTRAVENOUS | Status: AC
  Administered 2021-01-27: 1000 mL via INTRAVENOUS

## 2021-01-27 MED ORDER — METOCLOPRAMIDE HCL 5 MG/ML IJ SOLN
10.0000 mg | Freq: Once | INTRAMUSCULAR | Status: AC
  Administered 2021-01-27: 10 mg via INTRAVENOUS
  Filled 2021-01-27: qty 2

## 2021-01-27 MED ORDER — FAMOTIDINE IN NACL 20-0.9 MG/50ML-% IV SOLN
20.0000 mg | Freq: Once | INTRAVENOUS | Status: AC
  Administered 2021-01-27: 20 mg via INTRAVENOUS
  Filled 2021-01-27: qty 50

## 2021-01-27 MED ORDER — SCOPOLAMINE 1 MG/3DAYS TD PT72
1.0000 | MEDICATED_PATCH | TRANSDERMAL | Status: DC
  Administered 2021-01-27: 1.5 mg via TRANSDERMAL
  Filled 2021-01-27: qty 1

## 2021-01-27 NOTE — MAU Note (Signed)
Pt has been having n/v on and off for a month. Not able to keep anything down for a few days. C/o headache as well.

## 2021-01-27 NOTE — MAU Provider Note (Signed)
History     CSN: [id number]  Arrival date and time: 01/27/21 1909   Event Date/Time   First Provider Initiated Contact with Patient 01/27/21 2221      Chief Complaint  Patient presents with   Nausea   Emesis   25 y.o. [id number] @[redacted]w[redacted]d  by LMP presenting with N/V. Reports 15 lbs weight loss since August. Endorses LLQ pain, rates 7/10. Started 4 days ago. Tried Tylenol which helped temporarily. No VB or discharge. No urinary sx. Felt feverish and chills 2 nights ago. N/V started a month ago. Reports emesis 10 times today. Cannot tolerate food or fluids. Has tried ginger chews but not helping.    OB History     Gravida  5   Para  2   Term  2   Preterm      AB  1   Living  2      SAB      IAB  1   Ectopic      Multiple  0   Live Births  2           Past Medical History:  Diagnosis Date   Abortion    Back pain affecting pregnancy    Depression    Scoliosis    Subchorionic hematoma     Past Surgical History:  Procedure Laterality Date   NO PAST SURGERIES     THERAPEUTIC ABORTION  05/28/2018    Family History  Problem Relation Age of Onset   Hypertension Mother    Cancer Father    Hypertension Maternal Grandmother     Social History   Tobacco Use   Smoking status: Never   Smokeless tobacco: Never  Vaping Use   Vaping Use: Never used  Substance Use Topics   Alcohol use: No   Drug use: No    Allergies: No Known Allergies  Medications Prior to Admission  Medication Sig Dispense Refill Last Dose   Prenatal Vit-Fe Fumarate-FA (MULTIVITAMIN-PRENATAL) 27-0.8 MG TABS tablet Take 1 tablet by mouth daily at 12 noon.   01/27/2021   fluconazole (DIFLUCAN) 150 MG tablet Take 1 tablet by mouth. Repeat in 3 days if symptoms persists. 1 tablet 1    terconazole (TERAZOL 7) 0.4 % vaginal cream 1 applicator intravaginally QHS x 3 days (Patient not taking: Reported on 12/02/2020) 45 g 0     Review of Systems  Constitutional:  Positive for chills and  fever.  Gastrointestinal:  Positive for abdominal pain, nausea and vomiting. Negative for constipation and diarrhea.  Genitourinary:  Negative for dysuria, hematuria and vaginal bleeding.  Physical Exam   Blood pressure 122/71, pulse (!) 54, temperature 98.2 F (36.8 C), resp. rate 18, height 5\' 3"  (1.6 m), weight 61.7 kg, last menstrual period 12/17/2020, unknown if currently breastfeeding.  Physical Exam Vitals and nursing note reviewed.  Constitutional:      General: She is not in acute distress.    Appearance: Normal appearance.  HENT:     Head: Normocephalic and atraumatic.  Pulmonary:     Effort: Pulmonary effort is normal. No respiratory distress.  Abdominal:     General: There is no distension.     Palpations: Abdomen is soft. There is no mass.     Tenderness: There is no abdominal tenderness. There is no guarding or rebound.     Hernia: No hernia is present.  Musculoskeletal:        General: Normal range of motion.     Cervical back: Normal  range of motion.  Skin:    General: Skin is warm and dry.  Neurological:     General: No focal deficit present.     Mental Status: She is alert and oriented to person, place, and time.  Psychiatric:        Mood and Affect: Mood normal.        Behavior: Behavior normal.   Results for orders placed or performed during the hospital encounter of 01/27/21 (from the past 24 hour(s))  Urinalysis, Routine w reflex microscopic Urine, Clean Catch     Status: Abnormal   Collection Time: 01/27/21  7:52 PM  Result Value Ref Range   Color, Urine YELLOW YELLOW   APPearance CLEAR CLEAR   Specific Gravity, Urine 1.015 1.005 - 1.030   pH 6.5 5.0 - 8.0   Glucose, UA NEGATIVE NEGATIVE mg/dL   Hgb urine dipstick NEGATIVE NEGATIVE   Bilirubin Urine NEGATIVE NEGATIVE   Ketones, ur 15 (A) NEGATIVE mg/dL   Protein, ur NEGATIVE NEGATIVE mg/dL   Nitrite NEGATIVE NEGATIVE   Leukocytes,Ua NEGATIVE NEGATIVE  Pregnancy, urine POC     Status: Abnormal    Collection Time: 01/27/21  7:53 PM  Result Value Ref Range   Preg Test, Ur POSITIVE (A) NEGATIVE  CBC     Status: None   Collection Time: 01/27/21 10:41 PM  Result Value Ref Range   WBC 4.6 4.0 - 10.5 K/uL   RBC 4.78 3.87 - 5.11 MIL/uL   Hemoglobin 13.9 12.0 - 15.0 g/dL   HCT 41.5 36.0 - 46.0 %   MCV 86.8 80.0 - 100.0 fL   MCH 29.1 26.0 - 34.0 pg   MCHC 33.5 30.0 - 36.0 g/dL   RDW 13.2 11.5 - 15.5 %   Platelets 271 150 - 400 K/uL   nRBC 0.0 0.0 - 0.2 %  Comprehensive metabolic panel     Status: Abnormal   Collection Time: 01/27/21 10:41 PM  Result Value Ref Range   Sodium 136 135 - 145 mmol/L   Potassium 3.7 3.5 - 5.1 mmol/L   Chloride 101 98 - 111 mmol/L   CO2 25 22 - 32 mmol/L   Glucose, Bld 87 70 - 99 mg/dL   BUN <5 (L) 6 - 20 mg/dL   Creatinine, Ser 0.68 0.44 - 1.00 mg/dL   Calcium 9.9 8.9 - 10.3 mg/dL   Total Protein 7.5 6.5 - 8.1 g/dL   Albumin 3.9 3.5 - 5.0 g/dL   AST 28 15 - 41 U/L   ALT 18 0 - 44 U/L   Alkaline Phosphatase 58 38 - 126 U/L   Total Bilirubin 0.3 0.3 - 1.2 mg/dL   GFR, Estimated >60 >60 mL/min   Anion gap 10 5 - 15  hCG, quantitative, pregnancy     Status: Abnormal   Collection Time: 01/27/21 10:41 PM  Result Value Ref Range   hCG, Beta Chain, Quant, S 56,466 (H) <5 mIU/mL  Wet prep, genital     Status: Abnormal   Collection Time: 01/27/21 11:04 PM   Specimen: PATH Cytology Cervicovaginal Ancillary Only  Result Value Ref Range   Yeast Wet Prep HPF POC NONE SEEN NONE SEEN   Trich, Wet Prep NONE SEEN NONE SEEN   Clue Cells Wet Prep HPF POC PRESENT (A) NONE SEEN   WBC, Wet Prep HPF POC <10 <10   Sperm NONE SEEN    US OB LESS THAN 14 WEEKS WITH OB TRANSVAGINAL  Result Date: 01/27/2021 CLINICAL DATA:  Pain.  EXAM: OBSTETRIC <14 WK [place] AND TRANSVAGINAL OB US TECHNIQUE: Both transabdominal and transvaginal ultrasound examinations were performed for complete evaluation of the gestation as well as the maternal uterus, adnexal regions, and pelvic  cul-de-sac. Transvaginal technique was performed to assess early pregnancy. COMPARISON:  10/17/2020. FINDINGS: Intrauterine gestational sac: Single Yolk sac:  Yes Embryo:  Yes Cardiac Activity: Yes Heart Rate: 108 bpm CRL:  5.0 mm   6 w   1 d                  [place] EDC: 09/21/2021 Subchorionic hemorrhage:  None visualized. Maternal uterus/adnexae: Within normal limits. A possible corpus luteal cyst is noted in the right ovary. A small amount of free fluid is noted in the pelvis. IMPRESSION: Single live intrauterine pregnancy with estimated gestational age of [redacted] weeks 1 day. [facility] 09/21/2021. No acute abnormality is identified. Electronically Signed   By: Brett Fairy M.D.   On: 01/27/2021 23:28    MAU Course  Procedures LR Reglan Scopolamine  MDM Labs and [place] ordered and reviewed. Viable IUP on [place]. Feeling better after meds. No emesis. Tolerating po. Stable for discharge.  Assessment and Plan   1. [redacted] weeks gestation of pregnancy   2. Abdominal pain affecting pregnancy   3. Normal intrauterine pregnancy on prenatal ultrasound in first trimester   4. Morning sickness    Discharge home Follow up at [facility] to start care Rx Reglan Rx Scopolamine Rx Pepcid Return precautions  Allergies as of 01/28/2021   No Known Allergies      Medication List     STOP taking these medications    fluconazole 150 MG tablet Commonly known as: DIFLUCAN   terconazole 0.4 % vaginal cream Commonly known as: TERAZOL 7       TAKE these medications    famotidine 20 MG tablet Commonly known as: PEPCID Take 1 tablet (20 mg total) by mouth at bedtime.   metoCLOPramide 10 MG tablet Commonly known as: Reglan Take 1 tablet (10 mg total) by mouth every 6 (six) hours as needed for nausea or vomiting.   multivitamin-prenatal 27-0.8 MG Tabs tablet Take 1 tablet by mouth daily at 12 noon.   scopolamine 1 MG/3DAYS Commonly known as: TRANSDERM-SCOP Place 1 patch (1.5 mg total) onto the skin every 3 (three)  days. Start taking on: January 30, 2021       Julianne Handler, [place] 01/27/2021, 10:28 PM

## 2021-01-28 DIAGNOSIS — O210 Mild hyperemesis gravidarum: Secondary | ICD-10-CM

## 2021-01-28 DIAGNOSIS — Z3A01 Less than 8 weeks gestation of pregnancy: Secondary | ICD-10-CM

## 2021-01-28 LAB — GC/CHLAMYDIA PROBE AMP (~~LOC~~) NOT AT ARMC
Chlamydia: NEGATIVE
Comment: NEGATIVE
Comment: NORMAL
Neisseria Gonorrhea: NEGATIVE

## 2021-01-28 MED ORDER — FAMOTIDINE 20 MG PO TABS: 20.0000 mg | ORAL_TABLET | Freq: Every day | ORAL | 0 refills | Status: DC

## 2021-01-28 MED ORDER — METOCLOPRAMIDE HCL 10 MG PO TABS: 10.0000 mg | ORAL_TABLET | Freq: Four times a day (QID) | ORAL | 0 refills | Status: DC | PRN

## 2021-01-28 MED ORDER — SCOPOLAMINE 1 MG/3DAYS TD PT72: 1.0000 | MEDICATED_PATCH | TRANSDERMAL | 12 refills | Status: DC

## 2021-01-31 ENCOUNTER — Inpatient Hospital Stay (HOSPITAL_COMMUNITY)
Admission: AD | Admit: 2021-01-31 | Discharge: 2021-01-31 | Disposition: A | Discharge status: Home / Self Care | Payer: Medicaid Other | Attending: Family Medicine | Admitting: Family Medicine

## 2021-01-31 ENCOUNTER — Other Ambulatory Visit: Payer: Self-pay

## 2021-01-31 ENCOUNTER — Encounter (HOSPITAL_COMMUNITY): Payer: Self-pay | Admitting: Family Medicine

## 2021-01-31 DIAGNOSIS — Z3A01 Less than 8 weeks gestation of pregnancy: Secondary | ICD-10-CM | POA: Diagnosis not present

## 2021-01-31 DIAGNOSIS — O219 Vomiting of pregnancy, unspecified: Secondary | ICD-10-CM

## 2021-01-31 LAB — URINALYSIS, ROUTINE W REFLEX MICROSCOPIC
Bilirubin Urine: NEGATIVE
Glucose, UA: NEGATIVE mg/dL
Hgb urine dipstick: NEGATIVE
Ketones, ur: 20 mg/dL — AB
Leukocytes,Ua: NEGATIVE
Nitrite: NEGATIVE
Protein, ur: NEGATIVE mg/dL
Specific Gravity, Urine: 1.018 (ref 1.005–1.030)
pH: 7 (ref 5.0–8.0)

## 2021-01-31 MED ORDER — LACTATED RINGERS IV BOLUS
1000.0000 mL | Freq: Once | INTRAVENOUS | Status: AC
  Administered 2021-01-31: 1000 mL via INTRAVENOUS

## 2021-01-31 MED ORDER — METOCLOPRAMIDE HCL 5 MG/ML IJ SOLN
10.0000 mg | Freq: Once | INTRAMUSCULAR | Status: AC
  Administered 2021-01-31: 10 mg via INTRAVENOUS
  Filled 2021-01-31: qty 2

## 2021-01-31 NOTE — MAU Note (Addendum)
Presents with c/o N/V, reports unable to keep anything down despite taking prescribed meds.  Also states she has ketones in her urine based on her My Chart

## 2021-01-31 NOTE — MAU Provider Note (Signed)
History     CSN: [id number]  Arrival date and time: 01/31/21 1527   None     Chief Complaint  Patient presents with   Emesis   Nausea   HPI Emily Cantu is a 25 y.o G5P2012 at [redacted]w[redacted]d who presents to MAU for nausea and vomiting. Patient reports she was seen in MAU several days ago for similar complaint. Is able to sometimes tolerate water, but cannot keep down any other foods or liquids. She has tried eating crackers, rice, oatmeal, spaghetti, steak and potatoes. Last had crackers at 9am, which she threw up. Was given rx for scopolamine patch, reglan, and pepcid. Last took reglan this morning at 10am, took pepcid the night before last, and took her scope patch off at 5pm today as it has already been on for 3 days.   OB History     Gravida  5   Para  2   Term  2   Preterm      AB  1   Living  2      SAB      IAB  1   Ectopic      Multiple  0   Live Births  2           Past Medical History:  Diagnosis Date   Abortion    Back pain affecting pregnancy    Depression    Scoliosis    Subchorionic hematoma     Past Surgical History:  Procedure Laterality Date   NO PAST SURGERIES     THERAPEUTIC ABORTION  05/28/2018    Family History  Problem Relation Age of Onset   Hypertension Mother    Cancer Father    Hypertension Maternal Grandmother     Social History   Tobacco Use   Smoking status: Never   Smokeless tobacco: Never  Vaping Use   Vaping Use: Never used  Substance Use Topics   Alcohol use: No   Drug use: No    Allergies: No Known Allergies  No medications prior to admission.    Review of Systems  Constitutional: Negative.   Respiratory: Negative.    Cardiovascular: Negative.   Gastrointestinal:  Positive for nausea and vomiting. Negative for abdominal pain.  Genitourinary: Negative.   Musculoskeletal: Negative.   Neurological: Negative.   Physical Exam   Blood pressure (!) 117/52, pulse (!) 50, temperature 98.3 F (36.8 C),  temperature source Oral, resp. rate 19, height 5\' 3"  (1.6 m), weight 61.1 kg, last menstrual period 12/17/2020, SpO2 100 %, unknown if currently breastfeeding.  Physical Exam Vitals and nursing note reviewed.  Constitutional:      General: She is not in acute distress.    Appearance: She is normal weight.  Eyes:     Pupils: Pupils are equal, round, and reactive to light.  Cardiovascular:     Rate and Rhythm: Bradycardia present.  Pulmonary:     Effort: Pulmonary effort is normal.  Abdominal:     Palpations: Abdomen is soft.     Tenderness: There is no abdominal tenderness.  Musculoskeletal:        General: Normal range of motion.     Cervical back: Normal range of motion.  Skin:    General: Skin is warm and dry.  Neurological:     General: No focal deficit present.     Mental Status: She is alert and oriented to person, place, and time.  Psychiatric:        Mood and  Affect: Mood normal.        Behavior: Behavior normal.        Thought Content: Thought content normal.        Judgment: Judgment normal.    MAU Course  Procedures UA LR Reglan IV   MDM Nausea improved, tolerated PO  Assessment and Plan  [redacted] weeks gestation of pregnancy Nausea and vomiting affecting pregnancy  - Discharge home in stable condition - Encouraged patient to take prescriptions as prescribed - Return to MAU as needed or for worsening symptoms - Keep OB appt as scheduled   Brand Males, CNM 01/31/2021, 7:57 PM

## 2021-02-23 NOTE — L&D Delivery Note (Signed)
[facility] Delivery Note  Emily Cantu is a 26 y.o. [id number] s/p SVD at [redacted]w[redacted]d. She was admitted for SOL.   ROM: 6h 31m with clear fluid GBS Status: Neg Maximum Maternal Temperature: 101.6  Labor Progress: Patient presented at 7 cm.  As found after epidural.  Contractions were spaced between 5 to 10 minutes and so Pitocin was initiated.  Patient continued to progress on Pitocin   Delivery Date/Time: 1821 Delivery: Called to room and patient was complete and pushing. Head delivered ROP. No nuchal cord present. Shoulder and body delivered in usual fashion. Infant with spontaneous cry, placed on mother's abdomen, dried and stimulated. Cord clamped x 2 after 5-minute delay, and cut by FOB. Cord blood drawn. Placenta delivered spontaneously, intact, with 3-vessel cord. Fundus firm with massage and Pitocin. Labia, perineum, vagina, and cervix inspected, no laceration found.  Placenta: Spontaneous, intact Complications: None Lacerations: None EBL: 75 mL Analgesia: Epidural  Infant: viable  APGARs 8 and 9   pending  Derrel Nip, MD  [facility] 09/14/2021, 6:43 PM

## 2021-03-06 ENCOUNTER — Ambulatory Visit: Payer: Medicaid Other

## 2021-03-06 ENCOUNTER — Encounter: Payer: Self-pay | Admitting: Family Medicine

## 2021-03-06 ENCOUNTER — Encounter: Payer: Self-pay | Admitting: General Practice

## 2021-03-06 ENCOUNTER — Ambulatory Visit (INDEPENDENT_AMBULATORY_CARE_PROVIDER_SITE_OTHER): Payer: Medicaid Other | Admitting: Obstetrics and Gynecology

## 2021-03-06 ENCOUNTER — Other Ambulatory Visit: Payer: Self-pay

## 2021-03-06 ENCOUNTER — Other Ambulatory Visit (HOSPITAL_COMMUNITY)
Admission: RE | Admit: 2021-03-06 | Discharge: 2021-03-06 | Disposition: A | Discharge status: Home / Self Care | Payer: Medicaid Other | Source: Ambulatory Visit | Attending: Family Medicine | Admitting: Family Medicine

## 2021-03-06 VITALS — BP 109/54 | HR 50 | Wt 130.0 lb

## 2021-03-06 DIAGNOSIS — Z3481 Encounter for supervision of other normal pregnancy, first trimester: Secondary | ICD-10-CM

## 2021-03-06 DIAGNOSIS — Z3A11 11 weeks gestation of pregnancy: Secondary | ICD-10-CM | POA: Diagnosis not present

## 2021-03-06 DIAGNOSIS — Z3480 Encounter for supervision of other normal pregnancy, unspecified trimester: Secondary | ICD-10-CM | POA: Insufficient documentation

## 2021-03-06 DIAGNOSIS — O3680X0 Pregnancy with inconclusive fetal viability, not applicable or unspecified: Secondary | ICD-10-CM

## 2021-03-06 NOTE — Progress Notes (Signed)
°  Subjective:    Emily Cantu is a [id number] [redacted]w[redacted]d being seen today for her first obstetrical visit.  Her obstetrical history is significant for two previous low risks pregnancies. Patient does intend to breast feed and is interested in a water birth. Pregnancy history fully reviewed.  Patient reports no complaints.  Vitals:   03/06/21 0913  BP: (!) 109/54  Pulse: (!) 50  Weight: 130 lb (59 kg)    HISTORY: OB History  Gravida Para Term Preterm AB Living  5 2 2   1 2   SAB IAB Ectopic Multiple Live Births    1   0 2    # Outcome Date GA Lbr Len/2nd Weight Sex Delivery Anes PTL Lv  5 Current           4 Term 06/18/19 [redacted]w[redacted]d 09:15 / 00:12 6 lb 8.1 oz (2.951 kg) M Vag-Spont EPI  LIV  3 IAB 05/28/18          2 Term 12/25/16 [redacted]w[redacted]d 11:34 / 01:02 6 lb 8.8 oz (2.971 kg) M Vag-Spont EPI  LIV  1 Gravida            Past Medical History:  Diagnosis Date   Abortion    Back pain affecting pregnancy    Chlamydia infection affecting pregnancy in third trimester 06/18/2019   Depression    Scoliosis    Subchorionic hematoma    Subchorionic hematoma in first trimester 11/01/2018   Bleeding precations Yolk sac seen at 5 wks Recheck 01/01/2019 in 2-3 weeks   Vaginal delivery 12/24/2016   Past Surgical History:  Procedure Laterality Date   NO PAST SURGERIES     THERAPEUTIC ABORTION  05/28/2018   Family History  Problem Relation Age of Onset   Hypertension Mother    Cancer Father    Hypertension Maternal Grandmother      Exam    Uterus:     Pelvic Exam:    Perineum: Normal Perineum   Vulva: normal   Vagina:  normal mucosa, normal discharge   pH:    Cervix: multiparous appearance and cervix is closed and long   Adnexa: no mass, fullness, tenderness   Bony Pelvis: gynecoid  System: Breast:  normal appearance, no masses or tenderness   Skin: normal coloration and turgor, no rashes    Neurologic: oriented, no focal deficits   Extremities: normal strength, tone, and muscle mass    HEENT extra ocular movement intact   Mouth/Teeth mucous membranes moist, pharynx normal without lesions and dental hygiene good   Neck supple and no masses   Cardiovascular: regular rate and rhythm   Respiratory:  appears well, vitals normal, no respiratory distress, acyanotic, normal RR, chest clear, no wheezing, crepitations, rhonchi, normal symmetric air entry   Abdomen: soft, non-tender; bowel sounds normal; no masses,  no organomegaly   Urinary:       Assessment:    Pregnancy: 07/28/2018 Patient Active Problem List   Diagnosis Date Noted   Supervision of other normal pregnancy, antepartum 03/06/2021        Plan:     Initial labs drawn. Prenatal vitamins. Problem list reviewed and updated. Genetic Screening discussed : panorama ordered.  Ultrasound discussed; fetal survey: ordered.  Follow up in 4 weeks. 50% of 30 min visit spent on counseling and coordination of care.  Information on waterbirth provided. Patient understands that she needs to be seen by a midwife for final approval   Emily Cantu 03/06/2021

## 2021-03-06 NOTE — Patient Instructions (Addendum)
Considering Waterbirth? Guide for patients at [facility]) Why consider waterbirth? Gentle birth for babies  Less pain medicine used in labor  May allow for passive descent/less pushing  May reduce perineal tears  More mobility and instinctive maternal position changes  Increased maternal relaxation   Is waterbirth safe? What are the risks of infection, drowning or other complications? Infection:  Very low risk (3.7 % for tub vs 4.8% for bed)  7 in 8000 waterbirths with documented infection  Poorly cleaned equipment most common cause  Slightly lower group B strep transmission rate  Drowning  Maternal:  Very low risk  Related to seizures or fainting  Newborn:  Very low risk. No evidence of increased risk of respiratory problems in multiple large studies  Physiological protection from breathing under water  Avoid underwater birth if there are any fetal complications  Once baby's head is out of the water, keep it out.  Birth complication  Some reports of cord trauma, but risk decreased by bringing baby to surface gradually  No evidence of increased risk of shoulder dystocia. Mothers can usually change positions faster in water than in a bed, possibly aiding the maneuvers to free the shoulder.   There are 2 things you MUST do to have a waterbirth with [facility]: Attend a waterbirth class at [place] at [facility]   3rd Wednesday of every month from 7-9 pm (virtual during [place]) [employer] at [email or web address] or [email or web address] or by calling [phone] Bring [place] the certificate from the class to your prenatal appointment or send via [place] with a midwife at 36 weeks* to see if you can still plan a waterbirth and to sign the consent.   *We also recommend that you schedule as many of your prenatal visits with a midwife as possible.    Helpful information: You may want to bring a bathing suit top to the hospital  to wear during labor but this is optional.  All other supplies are provided by the hospital. Please arrive at the hospital with signs of active labor, and do not wait at home until late in labor. It takes 45 min- 2 hours for COVID testing, fetal monitoring, and check in to your room to take place, plus transport and filling of the waterbirth tub.    Things that would prevent you from having a waterbirth: Unknown or Positive COVID-19 diagnosis upon admission to hospital* Premature, <37wks  Previous cesarean birth  Presence of thick meconium-stained fluid  Multiple gestation (Twins, triplets, etc.)  Uncontrolled diabetes or gestational diabetes requiring medication  Hypertension diagnosed in pregnancy or preexisting hypertension (gestational hypertension, preeclampsia, or chronic hypertension) Fetal growth restriction (your baby measures less than 10th percentile on ultrasound) Heavy vaginal bleeding  Non-reassuring fetal heart rate  Active infection (MRSA, etc.). Group B Strep is NOT a contraindication for waterbirth.  If your labor has to be induced and induction method requires continuous monitoring of the baby's heart rate  Other risks/issues identified by your obstetrical provider   Please remember that birth is unpredictable. Under certain unforeseeable circumstances your provider may advise against giving birth in the tub. These decisions will be made on a case-by-case basis and with the safety of you and your baby as our highest priority.   *Please remember that in order to have a waterbirth, you must test Negative to COVID-19 upon admission to the hospital.  Updated 06/03/20    First Trimester of Pregnancy The first trimester of pregnancy  starts on the first day of your last menstrual period until the end of week 12. This is months 1 through 3 of pregnancy. A week after a sperm fertilizes an egg, the egg will implant into the wall of the uterus and begin to develop into a baby. By  the end of 12 weeks, all the baby's organs will be formed and the baby will be 2-3 inches in size. Body changes during your first trimester Your body goes through many changes during pregnancy. The changes vary and generally return to normal after your baby is born. Physical changes You may gain or lose weight. Your breasts may begin to grow larger and become tender. The tissue that surrounds your nipples (areola) may become darker. Dark spots or blotches (chloasma or mask of pregnancy) may develop on your face. You may have changes in your hair. These can include thickening or thinning of your hair or changes in texture. Health changes You may feel nauseous, and you may vomit. You may have heartburn. You may develop headaches. You may develop constipation. Your gums may bleed and may be sensitive to brushing and flossing. Other changes You may tire easily. You may urinate more often. Your menstrual periods will stop. You may have a loss of appetite. You may develop cravings for certain kinds of food. You may have changes in your emotions from day to day. You may have more vivid and strange dreams. Follow these instructions at home: Medicines Follow your health care provider's instructions regarding medicine use. Specific medicines may be either safe or unsafe to take during pregnancy. Do not take any medicines unless told to by your health care provider. Take a prenatal vitamin that contains at least 600 micrograms (mcg) of folic acid. Eating and drinking Eat a healthy diet that includes fresh fruits and vegetables, whole grains, good sources of protein such as meat, eggs, or tofu, and low-fat dairy products. Avoid raw meat and unpasteurized juice, milk, and cheese. These carry germs that can harm you and your baby. If you feel nauseous or you vomit: Eat 4 or 5 small meals a day instead of 3 large meals. Try eating a few soda crackers. Drink liquids between meals instead of during  meals. You may need to take these actions to prevent or treat constipation: Drink enough fluid to keep your urine pale yellow. Eat foods that are high in fiber, such as beans, whole grains, and fresh fruits and vegetables. Limit foods that are high in fat and processed sugars, such as fried or sweet foods. Activity Exercise only as directed by your health care provider. Most people can continue their usual exercise routine during pregnancy. Try to exercise for 30 minutes at least 5 days a week. Stop exercising if you develop pain or cramping in the lower abdomen or lower back. Avoid exercising if it is very hot or humid or if you are at high altitude. Avoid heavy lifting. If you choose to, you may have sex unless your health care provider tells you not to. Relieving pain and discomfort Wear a good support bra to relieve breast tenderness. Rest with your legs elevated if you have leg cramps or low back pain. If you develop bulging veins (varicose veins) in your legs: Wear support hose as told by your health care provider. Elevate your feet for 15 minutes, 3-4 times a day. Limit salt in your diet. Safety Wear your seat belt at all times when driving or riding in a car. Talk with your  health care provider if someone is verbally or physically abusive to you. Talk with your health care provider if you are feeling sad or have thoughts of hurting yourself. Lifestyle Do not use hot tubs, steam rooms, or saunas. Do not douche. Do not use tampons or scented sanitary pads. Do not use herbal remedies, alcohol, illegal drugs, or medicines that are not approved by your health care provider. Chemicals in these products can harm your baby. Do not use any products that contain nicotine or tobacco, such as cigarettes, e-cigarettes, and chewing tobacco. If you need help quitting, ask your health care provider. Avoid cat litter boxes and soil used by cats. These carry germs that can cause birth defects in the  baby and possibly loss of the unborn baby (fetus) by miscarriage or stillbirth. General instructions During routine prenatal visits in the first trimester, your health care provider will do a physical exam, perform necessary tests, and ask you how things are going. Keep all follow-up visits. This is important. Ask for help if you have counseling or nutritional needs during pregnancy. Your health care provider can offer advice or refer you to specialists for help with various needs. Schedule a dentist appointment. At home, brush your teeth with a soft toothbrush. Floss gently. Write down your questions. Take them to your prenatal visits. Where to find more information American Pregnancy Association: americanpregnancy.[place] and Gynecologists: PoolDevices.com.pt Office on [employer] Health: KeywordPortfolios.com.br Contact a health care provider if you have: Dizziness. A fever. Mild pelvic cramps, pelvic pressure, or nagging pain in the abdominal area. Nausea, vomiting, or diarrhea that lasts for 24 hours or longer. A bad-smelling vaginal discharge. Pain when you urinate. Known exposure to a contagious illness, such as chickenpox, measles, Zika virus, HIV, or hepatitis. Get help right away if you have: Spotting or bleeding from your vagina. Severe abdominal cramping or pain. Shortness of breath or chest pain. Any kind of trauma, such as from a fall or a car crash. New or increased pain, swelling, or redness in an arm or leg. Summary The first trimester of pregnancy starts on the first day of your last menstrual period until the end of week 12 (months 1 through 3). Eating 4 or 5 small meals a day rather than 3 large meals may help to relieve nausea and vomiting. Do not use any products that contain nicotine or tobacco, such as cigarettes, e-cigarettes, and chewing tobacco. If you need help quitting, ask your health care provider. Keep all  follow-up visits. This is important. This information is not intended to replace advice given to you by your health care provider. Make sure you discuss any questions you have with your health care provider. Document Revised: 07/19/2019 Document Reviewed: 05/25/2019 Elsevier Patient Education  2022 [place] of Pregnancy The second trimester of pregnancy is from week 13 through week 27. This is months 4 through 6 of pregnancy. The second trimester is often a time when you feel your best. Your body has adjusted to being pregnant, and you begin to feel better physically. During the second trimester: Morning sickness has lessened or stopped completely. You may have more energy. You may have an increase in appetite. The second trimester is also a time when the unborn baby (fetus) is growing rapidly. At the end of the sixth month, the fetus may be up to 12 inches long and weigh about 1 pounds. You will likely begin to feel the baby move (quickening) between 73 and  20 weeks of pregnancy. Body changes during your second trimester Your body continues to go through many changes during your second trimester. The changes vary and generally return to normal after the baby is born. Physical changes Your weight will continue to increase. You will notice your lower abdomen bulging out. You may begin to get stretch marks on your hips, abdomen, and breasts. Your breasts will continue to grow and to become tender. Dark spots or blotches (chloasma or mask of pregnancy) may develop on your face. A dark line from your belly button to the pubic area (linea nigra) may appear. You may have changes in your hair. These can include thickening of your hair, rapid growth, and changes in texture. Some people also have hair loss during or after pregnancy, or hair that feels dry or thin. Health changes You may develop headaches. You may have heartburn. You may develop constipation. You may develop  hemorrhoids or swollen, bulging veins (varicose veins). Your gums may bleed and may be sensitive to brushing and flossing. You may urinate more often because the fetus is pressing on your bladder. You may have back pain. This is caused by: Weight gain. Pregnancy hormones that are relaxing the joints in your pelvis. A shift in weight and the muscles that support your balance. Follow these instructions at home: Medicines Follow your health care provider's instructions regarding medicine use. Specific medicines may be either safe or unsafe to take during pregnancy. Do not take any medicines unless approved by your health care provider. Take a prenatal vitamin that contains at least 600 micrograms (mcg) of folic acid. Eating and drinking Eat a healthy diet that includes fresh fruits and vegetables, whole grains, good sources of protein such as meat, eggs, or tofu, and low-fat dairy products. Avoid raw meat and unpasteurized juice, milk, and cheese. These carry germs that can harm you and your baby. You may need to take these actions to prevent or treat constipation: Drink enough fluid to keep your urine pale yellow. Eat foods that are high in fiber, such as beans, whole grains, and fresh fruits and vegetables. Limit foods that are high in fat and processed sugars, such as fried or sweet foods. Activity Exercise only as directed by your health care provider. Most people can continue their usual exercise routine during pregnancy. Try to exercise for 30 minutes at least 5 days a week. Stop exercising if you develop contractions in your uterus. Stop exercising if you develop pain or cramping in the lower abdomen or lower back. Avoid exercising if it is very hot or humid or if you are at a high altitude. Avoid heavy lifting. If you choose to, you may have sex unless your health care provider tells you not to. Relieving pain and discomfort Wear a supportive bra to prevent discomfort from breast  tenderness. Take warm sitz baths to soothe any pain or discomfort caused by hemorrhoids. Use hemorrhoid cream if your health care provider approves. Rest with your legs raised (elevated) if you have leg cramps or low back pain. If you develop varicose veins: Wear support hose as told by your health care provider. Elevate your feet for 15 minutes, 3-4 times a day. Limit salt in your diet. Safety Wear your seat belt at all times when driving or riding in a car. Talk with your health care provider if someone is verbally or physically abusive to you. Lifestyle Do not use hot tubs, steam rooms, or saunas. Do not douche. Do not use tampons or  scented sanitary pads. Avoid cat litter boxes and soil used by cats. These carry germs that can cause birth defects in the baby and possibly loss of the fetus by miscarriage or stillbirth. Do not use herbal remedies, alcohol, illegal drugs, or medicines that are not approved by your health care provider. Chemicals in these products can harm your baby. Do not use any products that contain nicotine or tobacco, such as cigarettes, e-cigarettes, and chewing tobacco. If you need help quitting, ask your health care provider. General instructions During a routine prenatal visit, your health care provider will do a physical exam and other tests. He or she will also discuss your overall health. Keep all follow-up visits. This is important. Ask your health care provider for a referral to a local prenatal education class. Ask for help if you have counseling or nutritional needs during pregnancy. Your health care provider can offer advice or refer you to specialists for help with various needs. Where to find more information American Pregnancy Association: americanpregnancy.[place] and Gynecologists: PoolDevices.com.pt Office on [employer] Health: KeywordPortfolios.com.br Contact a health care provider if you have: A  headache that does not go away when you take medicine. Vision changes or you see spots in front of your eyes. Mild pelvic cramps, pelvic pressure, or nagging pain in the abdominal area. Persistent nausea, vomiting, or diarrhea. A bad-smelling vaginal discharge or foul-smelling urine. Pain when you urinate. Sudden or extreme swelling of your face, hands, ankles, feet, or legs. A fever. Get help right away if you: Have fluid leaking from your vagina. Have spotting or bleeding from your vagina. Have severe abdominal cramping or pain. Have difficulty breathing. Have chest pain. Have fainting spells. Have not felt your baby move for the time period told by your health care provider. Have new or increased pain, swelling, or redness in an arm or leg. Summary The second trimester of pregnancy is from week 13 through week 27 (months 4 through 6). Do not use herbal remedies, alcohol, illegal drugs, or medicines that are not approved by your health care provider. Chemicals in these products can harm your baby. Exercise only as directed by your health care provider. Most people can continue their usual exercise routine during pregnancy. Keep all follow-up visits. This is important. This information is not intended to replace advice given to you by your health care provider. Make sure you discuss any questions you have with your health care provider. Document Revised: 07/19/2019 Document Reviewed: 05/25/2019 Elsevier Patient Education  2022 [employer].

## 2021-03-06 NOTE — Progress Notes (Signed)
DATING AND VIABILITY SONOGRAM   Emily Cantu is a 26 y.o. year old [id number] with LMP Patient's last menstrual period was 12/17/2020 (approximate). which would correlate to  [redacted]w[redacted]d weeks gestation.  She has regular menstrual cycles.   She is here today for a confirmatory initial sonogram.    GESTATION: SINGLETON     FETAL ACTIVITY:          Heart rate         152 bpm          The fetus is active.    GESTATIONAL AGE AND  BIOMETRICS:  Gestational criteria: Estimated Date of Delivery: 09/23/21 by LMP now at [redacted]w[redacted]d  Previous Scans:1      CROWN RUMP LENGTH         3.97 cm 4.24 cm         11-2 weeks                                                                               AVERAGE EGA(BY THIS SCAN):  11-2 weeks  WORKING EDD( LMP ):  09/23/21     Kathrene Alu 03/06/2021 2:47 PM

## 2021-03-06 NOTE — Progress Notes (Signed)
Last pap 12/02/20.

## 2021-03-07 LAB — CBC/D/PLT+RPR+RH+ABO+RUBIGG...
Antibody Screen: NEGATIVE
Basophils Absolute: 0 10*3/uL (ref 0.0–0.2)
Basos: 1 %
EOS (ABSOLUTE): 0.1 10*3/uL (ref 0.0–0.4)
Eos: 2 %
HCV Ab: <0.1 s/co ratio (ref 0.0–0.9)
HIV Screen 4th Generation wRfx: NONREACTIVE
Hematocrit: 36.2 % (ref 34.0–46.6)
Hemoglobin: 12.6 g/dL (ref 11.1–15.9)
Hepatitis B Surface Ag: NEGATIVE
Immature Grans (Abs): 0 10*3/uL (ref 0.0–0.1)
Immature Granulocytes: 0 %
Lymphocytes Absolute: 1.7 10*3/uL (ref 0.7–3.1)
Lymphs: 33 %
MCH: 29.4 pg (ref 26.6–33.0)
MCHC: 34.8 g/dL (ref 31.5–35.7)
MCV: 85 fL (ref 79–97)
Monocytes Absolute: 0.4 10*3/uL (ref 0.1–0.9)
Monocytes: 8 %
Neutrophils Absolute: 2.9 10*3/uL (ref 1.4–7.0)
Neutrophils: 56 %
Platelets: 316 10*3/uL (ref 150–450)
RBC: 4.28 x10E6/uL (ref 3.77–5.28)
RDW: 12.8 % (ref 11.7–15.4)
RPR Ser Ql: NONREACTIVE
Rh Factor: POSITIVE
Rubella Antibodies, IGG: 1.27 index (ref >0.99)
WBC: 5.1 10*3/uL (ref 3.4–10.8)

## 2021-03-07 LAB — GC/CHLAMYDIA PROBE AMP (~~LOC~~) NOT AT ARMC
Chlamydia: NEGATIVE
Comment: NEGATIVE
Comment: NORMAL
Neisseria Gonorrhea: NEGATIVE

## 2021-03-07 LAB — HCV INTERPRETATION

## 2021-03-09 LAB — URINE CULTURE

## 2021-03-17 NOTE — Progress Notes (Signed)
Left message for patient to call back for results. Cindy Hazy and [place] all with normal limits. Armandina Stammer RN

## 2021-04-01 ENCOUNTER — Other Ambulatory Visit: Payer: Self-pay

## 2021-04-01 ENCOUNTER — Other Ambulatory Visit: Payer: Self-pay | Admitting: *Deleted

## 2021-04-01 ENCOUNTER — Ambulatory Visit: Payer: Medicaid Other | Attending: Obstetrics and Gynecology

## 2021-04-01 DIAGNOSIS — Z3480 Encounter for supervision of other normal pregnancy, unspecified trimester: Secondary | ICD-10-CM | POA: Diagnosis not present

## 2021-04-01 DIAGNOSIS — O358XX0 Maternal care for other (suspected) fetal abnormality and damage, not applicable or unspecified: Secondary | ICD-10-CM | POA: Insufficient documentation

## 2021-04-01 DIAGNOSIS — Z363 Encounter for antenatal screening for malformations: Secondary | ICD-10-CM | POA: Diagnosis not present

## 2021-04-01 DIAGNOSIS — Z3A15 15 weeks gestation of pregnancy: Secondary | ICD-10-CM | POA: Insufficient documentation

## 2021-04-01 DIAGNOSIS — Z369 Encounter for antenatal screening, unspecified: Secondary | ICD-10-CM

## 2021-04-03 ENCOUNTER — Ambulatory Visit (INDEPENDENT_AMBULATORY_CARE_PROVIDER_SITE_OTHER): Payer: Medicaid Other | Admitting: Family Medicine

## 2021-04-03 ENCOUNTER — Other Ambulatory Visit: Payer: Self-pay

## 2021-04-03 VITALS — BP 98/53 | HR 54 | Wt 129.0 lb

## 2021-04-03 DIAGNOSIS — Z3A15 15 weeks gestation of pregnancy: Secondary | ICD-10-CM

## 2021-04-03 MED ORDER — ONDANSETRON 4 MG PO TBDP: 4.0000 mg | ORAL_TABLET | Freq: Four times a day (QID) | ORAL | 3 refills | Status: DC | PRN

## 2021-04-03 NOTE — Progress Notes (Signed)
° °  PRENATAL VISIT NOTE  Subjective:  Emily Cantu is a 26 y.o. [id number] at [redacted]w[redacted]d being seen today for ongoing prenatal care.  She is currently monitored for the following issues for this low-risk pregnancy and has Supervision of other normal pregnancy, antepartum on their problem list.  Patient reports nausea and vomiting.  Contractions: Not present. Vag. Bleeding: None.  Movement: Absent. Denies leaking of fluid.   The following portions of the patient's history were reviewed and updated as appropriate: allergies, current medications, past family history, past medical history, past social history, past surgical history and problem list.   Objective:   Vitals:   04/03/21 1055  BP: (!) 98/53  Pulse: (!) 54  Weight: 129 lb (58.5 kg)    Fetal Status: Fetal Heart Rate (bpm): 140   Movement: Absent     General:  Alert, oriented and cooperative. Patient is in no acute distress.  Skin: Skin is warm and dry. No rash noted.   Cardiovascular: Normal heart rate noted  Respiratory: Normal respiratory effort, no problems with respiration noted  Abdomen: Soft, gravid, appropriate for gestational age.  Pain/Pressure: Absent     Pelvic: Cervical exam deferred        Extremities: Normal range of motion.  Edema: None  Mental Status: Normal mood and affect. Normal behavior. Normal judgment and thought content.   Assessment and Plan:  Pregnancy: [id number] at [redacted]w[redacted]d 1. [redacted] weeks gestation of pregnancy Will add zofran for vomiting.  - AFP, Serum, Open Spina Bifida  Preterm labor symptoms and general obstetric precautions including but not limited to vaginal bleeding, contractions, leaking of fluid and fetal movement were reviewed in detail with the patient. Please refer to After Visit Summary for other counseling recommendations.   No follow-ups on file.  Future Appointments  Date Time Provider [place]  04/29/2021 10:30 AM [facility] NURSE [facility]  04/29/2021 10:45 AM [facility] US5 [facility]  [facility]  05/01/2021 10:55 AM Truett Mainland, DO [facility] None  05/29/2021 11:15 AM Truett Mainland, DO [facility] None    Truett Mainland, DO

## 2021-04-08 LAB — AFP, SERUM, OPEN SPINA BIFIDA
AFP MoM: 0.58
AFP Value: 22.7 ng/mL
Gest. Age on Collection Date: 15.3 weeks
Maternal Age At EDD: 25.2 yr
OSBR Risk 1 IN: 10000
Test Results:: NEGATIVE
Weight: 129 [lb_av]

## 2021-04-11 ENCOUNTER — Encounter (HOSPITAL_COMMUNITY): Payer: Self-pay | Admitting: Obstetrics and Gynecology

## 2021-04-11 ENCOUNTER — Inpatient Hospital Stay (HOSPITAL_COMMUNITY)
Admission: AD | Admit: 2021-04-11 | Discharge: 2021-04-11 | Disposition: A | Discharge status: Home / Self Care | Payer: Medicaid Other | Attending: Obstetrics and Gynecology | Admitting: Obstetrics and Gynecology

## 2021-04-11 ENCOUNTER — Other Ambulatory Visit: Payer: Self-pay

## 2021-04-11 DIAGNOSIS — G44209 Tension-type headache, unspecified, not intractable: Secondary | ICD-10-CM | POA: Diagnosis not present

## 2021-04-11 DIAGNOSIS — O99352 Diseases of the nervous system complicating pregnancy, second trimester: Secondary | ICD-10-CM | POA: Insufficient documentation

## 2021-04-11 DIAGNOSIS — R519 Headache, unspecified: Secondary | ICD-10-CM | POA: Diagnosis present

## 2021-04-11 DIAGNOSIS — Z3A16 16 weeks gestation of pregnancy: Secondary | ICD-10-CM | POA: Diagnosis not present

## 2021-04-11 DIAGNOSIS — Z3480 Encounter for supervision of other normal pregnancy, unspecified trimester: Secondary | ICD-10-CM

## 2021-04-11 LAB — URINALYSIS, ROUTINE W REFLEX MICROSCOPIC
Bilirubin Urine: NEGATIVE
Glucose, UA: NEGATIVE mg/dL
Hgb urine dipstick: NEGATIVE
Ketones, ur: 5 mg/dL — AB
Leukocytes,Ua: NEGATIVE
Nitrite: NEGATIVE
Protein, ur: NEGATIVE mg/dL
Specific Gravity, Urine: 1.016 (ref 1.005–1.030)
pH: 6 (ref 5.0–8.0)

## 2021-04-11 MED ORDER — BUTALBITAL-APAP-CAFFEINE 50-325-40 MG PO TABS
1.0000 | ORAL_TABLET | Freq: Four times a day (QID) | ORAL | 0 refills | Status: DC | PRN
Start: 2021-04-11 — End: 2021-09-16

## 2021-04-11 MED ORDER — BUTALBITAL-APAP-CAFFEINE 50-325-40 MG PO TABS
2.0000 | ORAL_TABLET | Freq: Once | ORAL | Status: AC
  Administered 2021-04-11: 2 via ORAL
  Filled 2021-04-11: qty 2

## 2021-04-11 MED ORDER — MAGNESIUM OXIDE -MG SUPPLEMENT 200 MG PO TABS
200.0000 mg | ORAL_TABLET | Freq: Every day | ORAL | 2 refills | Status: DC
Start: 2021-04-11 — End: 2021-09-16

## 2021-04-11 NOTE — MAU Provider Note (Signed)
History     CSN: [id number]  Arrival date and time: 04/11/21 1900   Event Date/Time   First Provider Initiated Contact with Patient 04/11/21 2015      Chief Complaint  Patient presents with   Headache   Emily Cantu is a 26 y.o. [id number] at [redacted]w[redacted]d who receives care at [facility].  She presents  today for Headache.  She states she has had a headache for "too long" and clarifies that has been present intermittently for the past 2 to 3 weeks.  She reports that the headache has been nonstop for the past 3 days and has not improved with eating, fluids, or ibuprofen.  She reports she took ibuprofen only once, 2 tablets today at 1 PM.  She describes the headache as a throbbing in the temporal areas that spreads across the head and rates it a 9/10.  She does also endorse some dizziness, lightheadedness, and blurred vision that has been present intermittently since yesterday from about 2 PM to 8 PM.  She also reports 1 incident of ringing in the ears with onset of headache.  She denies a history of migraines or headaches.  She does endorse a recent increase in stress.     OB History     Gravida  5   Para  2   Term  2   Preterm      AB  1   Living  2      SAB      IAB  1   Ectopic      Multiple  0   Live Births  2           Past Medical History:  Diagnosis Date   Abortion    Back pain affecting pregnancy    Chlamydia infection affecting pregnancy in third trimester 06/18/2019   Depression    Scoliosis    Subchorionic hematoma    Subchorionic hematoma in first trimester 11/01/2018   Bleeding precations Yolk sac seen at 5 wks Recheck [place] in 2-3 weeks   Vaginal delivery 12/24/2016    Past Surgical History:  Procedure Laterality Date   NO PAST SURGERIES     THERAPEUTIC ABORTION  05/28/2018    Family History  Problem Relation Age of Onset   Hypertension Mother    Cancer Father    Hypertension Maternal Grandmother     Social History   Tobacco Use   Smoking  status: Never   Smokeless tobacco: Never  Vaping Use   Vaping Use: Never used  Substance Use Topics   Alcohol use: No   Drug use: No    Allergies: No Known Allergies  Medications Prior to Admission  Medication Sig Dispense Refill Last Dose   ondansetron (ZOFRAN-ODT) 4 MG disintegrating tablet Take 1 tablet (4 mg total) by mouth every 6 (six) hours as needed for nausea. 30 tablet 3 Past Month   Prenatal Vit-Fe Fumarate-FA (MULTIVITAMIN-PRENATAL) 27-0.8 MG TABS tablet Take 1 tablet by mouth daily at 12 noon.   04/11/2021   famotidine (PEPCID) 20 MG tablet Take 1 tablet (20 mg total) by mouth at bedtime. (Patient not taking: Reported on 03/06/2021) 30 tablet 0 Unknown   metoCLOPramide (REGLAN) 10 MG tablet Take 1 tablet (10 mg total) by mouth every 6 (six) hours as needed for nausea or vomiting. (Patient not taking: Reported on 04/03/2021) 30 tablet 0 Unknown   scopolamine (TRANSDERM-SCOP) 1 MG/3DAYS Place 1 patch (1.5 mg total) onto the skin every 3 (three) days. (  Patient not taking: Reported on 03/06/2021) 10 patch 12 Unknown    Review of Systems  Constitutional:  Negative for chills and fever.  Eyes:  Positive for visual disturbance.  Respiratory:  Positive for cough (Intermittently). Negative for shortness of breath.   Gastrointestinal:  Negative for abdominal pain, constipation, diarrhea, nausea and vomiting.  Genitourinary:  Negative for difficulty urinating, dysuria, vaginal bleeding and vaginal discharge.  Musculoskeletal:  Negative for back pain.  Neurological:  Positive for dizziness, light-headedness and headaches.  Physical Exam   Blood pressure 103/62, pulse (!) 55, temperature 98.8 F (37.1 C), temperature source Oral, resp. rate 18, weight 59.7 kg, last menstrual period 12/17/2020, SpO2 99 %, unknown if currently breastfeeding. Vitals:   04/11/21 1946 04/11/21 2005 04/11/21 2144  BP: 109/63 103/62 110/62  Pulse: 64 (!) 55 70  Resp: 17 18 15   Temp: 98.3 F (36.8 C) 98.8 F  (37.1 C)   TempSrc: Oral Oral   SpO2: 100% 99% 100%  Weight: 59.7 kg      Physical Exam Constitutional:      Appearance: She is well-developed.  HENT:     Head: Normocephalic and atraumatic.  Eyes:     Extraocular Movements: Extraocular movements intact.     Pupils: Pupils are equal, round, and reactive to light.  Cardiovascular:     Rate and Rhythm: Normal rate and regular rhythm.  Pulmonary:     Effort: Pulmonary effort is normal. No respiratory distress.     Breath sounds: Normal breath sounds.  Abdominal:     Palpations: Abdomen is soft.  Musculoskeletal:        General: Normal range of motion.     Cervical back: Normal range of motion.  Skin:    General: Skin is dry.  Neurological:     Mental Status: She is alert and oriented to person, place, and time.     Cranial Nerves: Cranial nerves 2-12 are intact. No cranial nerve deficit.  Psychiatric:        Mood and Affect: Mood normal.   FHR 139 MAU Course  Procedures Results for orders placed or performed during the hospital encounter of 04/11/21 (from the past 24 hour(s))  Urinalysis, Routine w reflex microscopic     Status: Abnormal   Collection Time: 04/11/21  7:51 PM  Result Value Ref Range   Color, Urine YELLOW YELLOW   APPearance HAZY (A) CLEAR   Specific Gravity, Urine 1.016 1.005 - 1.030   pH 6.0 5.0 - 8.0   Glucose, UA NEGATIVE NEGATIVE mg/dL   Hgb urine dipstick NEGATIVE NEGATIVE   Bilirubin Urine NEGATIVE NEGATIVE   Ketones, ur 5 (A) NEGATIVE mg/dL   Protein, ur NEGATIVE NEGATIVE mg/dL   Nitrite NEGATIVE NEGATIVE   Leukocytes,Ua NEGATIVE NEGATIVE    MDM Exam Medication Prescription Assessment and Plan  26 year old, [id number]  SIUP at 16.3 weeks Tension Type HA  -Reviewed POC with patient. -Exam performed.  -Reviewed options: *IV therapy with compazine and benadryl *Oral therapy with Fioricet -Discussed risks and benefits of both options. -Patient requests oral therapy. -Informed that if no  relief noted, will consider IV therapy. -Will monitor and reassess.   [name] 04/11/2021, 8:15 PM   Reassessment (9:59 PM)  -Patient reports improvement in HA. -Discussed limited usage of Fioricet at home. -Reviewed recommendation of neurology consult for new onset HA. -Patient agreeable. Referral placed. -Discussed usage of Magnesium.  Instructed to take 200mg  daily and can increase to 400mg  daily if needed. -Patient  verbalizes understanding and without questions. -Prescriptions sent to pharmacy on file. -Encouraged to call primary office or return to MAU if symptoms worsen or with the onset of new symptoms. -Discharged to home in stable condition.  [name] MSN, CNM Advanced Practice Provider, [facility]

## 2021-04-11 NOTE — MAU Note (Signed)
Pt reports to MAU for headache that she has had intermittent for the past three weeks.  Pt reports that has just become more intense over the past couple of days.  Pt reports that she is drinking plenty of fluid but has not been eating well.  Pt states that she took ibuprofen around 1300 today and was not effective.  Pt was advised to not take ibuprofen in pregnancy by her OB office.  Denies VB or vaginal discharge.

## 2021-04-29 ENCOUNTER — Ambulatory Visit: Payer: Medicaid Other | Admitting: *Deleted

## 2021-04-29 ENCOUNTER — Other Ambulatory Visit: Payer: Self-pay

## 2021-04-29 ENCOUNTER — Ambulatory Visit: Payer: Medicaid Other | Attending: Obstetrics

## 2021-04-29 VITALS — BP 127/56 | HR 74

## 2021-04-29 DIAGNOSIS — O359XX0 Maternal care for (suspected) fetal abnormality and damage, unspecified, not applicable or unspecified: Secondary | ICD-10-CM | POA: Diagnosis not present

## 2021-04-29 DIAGNOSIS — Z362 Encounter for other antenatal screening follow-up: Secondary | ICD-10-CM | POA: Insufficient documentation

## 2021-04-29 DIAGNOSIS — Z3A19 19 weeks gestation of pregnancy: Secondary | ICD-10-CM | POA: Diagnosis not present

## 2021-04-29 DIAGNOSIS — Z369 Encounter for antenatal screening, unspecified: Secondary | ICD-10-CM | POA: Diagnosis not present

## 2021-05-01 ENCOUNTER — Ambulatory Visit (INDEPENDENT_AMBULATORY_CARE_PROVIDER_SITE_OTHER): Payer: Medicaid Other | Admitting: Family Medicine

## 2021-05-01 ENCOUNTER — Other Ambulatory Visit: Payer: Self-pay

## 2021-05-01 VITALS — BP 108/50 | HR 57 | Wt 137.0 lb

## 2021-05-01 DIAGNOSIS — Z3480 Encounter for supervision of other normal pregnancy, unspecified trimester: Secondary | ICD-10-CM

## 2021-05-01 DIAGNOSIS — Z3A19 19 weeks gestation of pregnancy: Secondary | ICD-10-CM

## 2021-05-01 NOTE — Progress Notes (Signed)
? ?  PRENATAL VISIT NOTE ? ?Subjective:  ?Emily Cantu is a 26 y.o. [id number] at [redacted]w[redacted]d being seen today for ongoing prenatal care.  She is currently monitored for the following issues for this low-risk pregnancy and has Supervision of other normal pregnancy, antepartum on their problem list. ? ?Patient reports no complaints.  Contractions: Not present. Vag. Bleeding: None.  Movement: Present. Denies leaking of fluid.  ? ?The following portions of the patient's history were reviewed and updated as appropriate: allergies, current medications, past family history, past medical history, past social history, past surgical history and problem list.  ? ?Objective:  ? ?Vitals:  ? 05/01/21 1105  ?BP: (!) 108/50  ?Pulse: (!) 57  ?Weight: 137 lb (62.1 kg)  ? ? ?Fetal Status: Fetal Heart Rate (bpm): 130   Movement: Present    ? ?General:  Alert, oriented and cooperative. Patient is in no acute distress.  ?Skin: Skin is warm and dry. No rash noted.   ?Cardiovascular: Normal heart rate noted  ?Respiratory: Normal respiratory effort, no problems with respiration noted  ?Abdomen: Soft, gravid, appropriate for gestational age.  Pain/Pressure: Absent     ?Pelvic: Cervical exam deferred        ?Extremities: Normal range of motion.  Edema: None  ?Mental Status: Normal mood and affect. Normal behavior. Normal judgment and thought content.  ? ?Assessment and Plan:  ?Pregnancy: [id number] at [redacted]w[redacted]d ?1. [redacted] weeks gestation of pregnancy ? ?2. Supervision of other normal pregnancy, antepartum ?FHT and FH normal ? ?Preterm labor symptoms and general obstetric precautions including but not limited to vaginal bleeding, contractions, leaking of fluid and fetal movement were reviewed in detail with the patient. ?Please refer to After Visit Summary for other counseling recommendations.  ? ?No follow-ups on file. ? ?Future Appointments  ?Date Time Provider Department Center  ?05/19/2021 10:30 AM Ocie Doyne, MD [facility] None  ?05/29/2021 11:15 AM  Levie Heritage, DO [facility] None  ?06/25/2021  8:35 AM Levie Heritage, DO [facility] None  ? ? ?Levie Heritage, DO ?

## 2021-05-19 ENCOUNTER — Encounter: Payer: Self-pay | Admitting: Psychiatry

## 2021-05-19 ENCOUNTER — Ambulatory Visit: Payer: Medicaid Other | Admitting: Psychiatry

## 2021-05-29 ENCOUNTER — Ambulatory Visit (INDEPENDENT_AMBULATORY_CARE_PROVIDER_SITE_OTHER): Payer: Medicaid Other | Admitting: Family Medicine

## 2021-05-29 VITALS — BP 99/60 | HR 69 | Wt 144.0 lb

## 2021-05-29 DIAGNOSIS — Z3A23 23 weeks gestation of pregnancy: Secondary | ICD-10-CM

## 2021-05-29 DIAGNOSIS — Z3480 Encounter for supervision of other normal pregnancy, unspecified trimester: Secondary | ICD-10-CM

## 2021-05-29 NOTE — Progress Notes (Signed)
? ?  PRENATAL VISIT NOTE ? ?Subjective:  ?Emily Cantu is a 26 y.o. [id number] at [redacted]w[redacted]d being seen today for ongoing prenatal care.  She is currently monitored for the following issues for this low-risk pregnancy and has Supervision of other normal pregnancy, antepartum on their problem list. ? ?Patient reports  had some contractions a couple days ago. No recent sex. Some round ligament pain now, but nothing else .  Contractions: Not present. Vag. Bleeding: None.  Movement: Present. Denies leaking of fluid.  ? ?The following portions of the patient's history were reviewed and updated as appropriate: allergies, current medications, past family history, past medical history, past social history, past surgical history and problem list.  ? ?Objective:  ? ?Vitals:  ? 05/29/21 1116  ?BP: 99/60  ?Pulse: 69  ?Weight: 144 lb (65.3 kg)  ? ? ?Fetal Status: Fetal Heart Rate (bpm): 132   Movement: Present    ? ?General:  Alert, oriented and cooperative. Patient is in no acute distress.  ?Skin: Skin is warm and dry. No rash noted.   ?Cardiovascular: Normal heart rate noted  ?Respiratory: Normal respiratory effort, no problems with respiration noted  ?Abdomen: Soft, gravid, appropriate for gestational age.  Pain/Pressure: Absent     ?Pelvic: Cervical exam deferred        ?Extremities: Normal range of motion.  Edema: None  ?Mental Status: Normal mood and affect. Normal behavior. Normal judgment and thought content.  ? ?Assessment and Plan:  ?Pregnancy: [id number] at [redacted]w[redacted]d ?1. [redacted] weeks gestation of pregnancy ?FHT and FH normal. [place] normal. Will watch for further contractions - go to MAU if she is having them. ? ?Preterm labor symptoms and general obstetric precautions including but not limited to vaginal bleeding, contractions, leaking of fluid and fetal movement were reviewed in detail with the patient. ?Please refer to After Visit Summary for other counseling recommendations.  ? ?No follow-ups on file. ? ?Future Appointments  ?Date Time  Provider [place]  ?06/25/2021  8:35 AM Nehemiah Settle Tanna Savoy, DO [facility] None  ?07/17/2021 10:35 AM Nehemiah Settle Tanna Savoy, DO [facility] None  ?07/31/2021 11:15 AM Truett Mainland, DO [facility] None  ?08/14/2021 10:55 AM Nehemiah Settle Tanna Savoy, DO [facility] None  ? ? ?Truett Mainland, DO ?

## 2021-06-16 ENCOUNTER — Other Ambulatory Visit (HOSPITAL_COMMUNITY)
Admission: RE | Admit: 2021-06-16 | Discharge: 2021-06-16 | Disposition: A | Discharge status: Home / Self Care | Payer: Medicaid Other | Source: Ambulatory Visit | Attending: Obstetrics & Gynecology | Admitting: Obstetrics & Gynecology

## 2021-06-16 ENCOUNTER — Ambulatory Visit: Payer: Medicaid Other

## 2021-06-16 ENCOUNTER — Ambulatory Visit (INDEPENDENT_AMBULATORY_CARE_PROVIDER_SITE_OTHER): Payer: Medicaid Other | Admitting: Obstetrics & Gynecology

## 2021-06-16 VITALS — BP 109/59 | HR 60 | Wt 149.0 lb

## 2021-06-16 DIAGNOSIS — N898 Other specified noninflammatory disorders of vagina: Secondary | ICD-10-CM | POA: Insufficient documentation

## 2021-06-16 DIAGNOSIS — B3731 Acute candidiasis of vulva and vagina: Secondary | ICD-10-CM | POA: Diagnosis not present

## 2021-06-16 NOTE — Progress Notes (Addendum)
SUBJECTIVE:  ?26 y.o. female complains of white vaginal discharge for 2  day(s). ?Denies abnormal vaginal bleeding or significant pelvic pain or ?fever. No UTI symptoms. Denies history of known exposure to STD. ? ?Patient's last menstrual period was 12/17/2020 (approximate). ? ?OBJECTIVE:  ?She appears well, afebrile. ?Urine dipstick: not done. ? ?ASSESSMENT:  ?Vaginal Discharge  ?Vaginal Odor ? ? ?PLAN:  BVAG, CVAG probe sent to lab. ?Treatment: To be determined once lab results are received ?ROV prn if symptoms persist or worsen.  ? ?Emily Cantu, CMA  ? ?Attestation of Attending Supervision of CMA/RN: Evaluation and management procedures were performed by the nurse under my supervision and collaboration.  I have reviewed the nursing note and chart, and I agree with the management and plan. ? ?Carolyn L. Harraway-Smith, M.D., [place]  ?

## 2021-06-18 ENCOUNTER — Encounter: Payer: Self-pay | Admitting: Obstetrics & Gynecology

## 2021-06-18 ENCOUNTER — Other Ambulatory Visit: Payer: Self-pay | Admitting: Obstetrics & Gynecology

## 2021-06-18 LAB — CERVICOVAGINAL ANCILLARY ONLY
Bacterial Vaginitis (gardnerella): NEGATIVE
Candida Glabrata: NEGATIVE
Candida Vaginitis: POSITIVE — AB
Comment: NEGATIVE
Comment: NEGATIVE
Comment: NEGATIVE

## 2021-06-18 MED ORDER — TERCONAZOLE 0.4 % VA CREA: 1.0000 | TOPICAL_CREAM | Freq: Every day | VAGINAL | 0 refills | Status: DC

## 2021-06-18 NOTE — Progress Notes (Signed)
See note below

## 2021-06-18 NOTE — Addendum Note (Signed)
Addended by: Willodean Rosenthal on: 06/18/2021 04:49 PM ? ? Modules accepted: Level of Service ? ?

## 2021-06-19 ENCOUNTER — Other Ambulatory Visit: Payer: Self-pay

## 2021-06-19 DIAGNOSIS — B3731 Acute candidiasis of vulva and vagina: Secondary | ICD-10-CM

## 2021-06-19 MED ORDER — TERCONAZOLE 0.4 % VA CREA: 1.0000 | TOPICAL_CREAM | Freq: Every day | VAGINAL | 0 refills | Status: DC

## 2021-06-19 NOTE — Progress Notes (Signed)
Patient called and made aware that she does have a yeast infection. Patient is pregnant and will use the terazol cream. Pharmacy verified.Armandina Stammer RN  ?

## 2021-06-25 ENCOUNTER — Encounter: Payer: Self-pay | Admitting: General Practice

## 2021-06-25 ENCOUNTER — Ambulatory Visit (INDEPENDENT_AMBULATORY_CARE_PROVIDER_SITE_OTHER): Payer: Medicaid Other | Admitting: Family Medicine

## 2021-06-25 VITALS — BP 105/59 | HR 57 | Wt 151.0 lb

## 2021-06-25 DIAGNOSIS — Z3480 Encounter for supervision of other normal pregnancy, unspecified trimester: Secondary | ICD-10-CM

## 2021-06-25 DIAGNOSIS — Z3A27 27 weeks gestation of pregnancy: Secondary | ICD-10-CM

## 2021-06-25 NOTE — Progress Notes (Signed)
? ?  PRENATAL VISIT NOTE ? ?Subjective:  ?Emily Cantu is a 26 y.o. [id number] at [redacted]w[redacted]d being seen today for ongoing prenatal care.  She is currently monitored for the following issues for this low-risk pregnancy and has Supervision of other normal pregnancy, antepartum on their problem list. ? ?Patient reports no complaints.  Contractions: Not present. Vag. Bleeding: None.  Movement: Present. Denies leaking of fluid.  ? ?The following portions of the patient's history were reviewed and updated as appropriate: allergies, current medications, past family history, past medical history, past social history, past surgical history and problem list.  ? ?Objective:  ? ?Vitals:  ? 06/25/21 0847  ?BP: (!) 105/59  ?Pulse: (!) 57  ?Weight: 151 lb (68.5 kg)  ? ? ?Fetal Status: Fetal Heart Rate (bpm): 130   Movement: Present    ? ?General:  Alert, oriented and cooperative. Patient is in no acute distress.  ?Skin: Skin is warm and dry. No rash noted.   ?Cardiovascular: Normal heart rate noted  ?Respiratory: Normal respiratory effort, no problems with respiration noted  ?Abdomen: Soft, gravid, appropriate for gestational age.  Pain/Pressure: Absent     ?Pelvic: Cervical exam deferred        ?Extremities: Normal range of motion.  Edema: None  ?Mental Status: Normal mood and affect. Normal behavior. Normal judgment and thought content.  ? ?Assessment and Plan:  ?Pregnancy: [id number] at [redacted]w[redacted]d ?1. [redacted] weeks gestation of pregnancy ?- Glucose Tolerance, 2 Hours w/1 Hour ?- RPR ?- HIV antibody (with reflex) ?- CBC ? ?2. Supervision of other normal pregnancy, antepartum ?FHT and FH normal ?- Glucose Tolerance, 2 Hours w/1 Hour ?- RPR ?- HIV antibody (with reflex) ?- CBC ? ?Preterm labor symptoms and general obstetric precautions including but not limited to vaginal bleeding, contractions, leaking of fluid and fetal movement were reviewed in detail with the patient. ?Please refer to After Visit Summary for other counseling recommendations.   ? ?No follow-ups on file. ? ?Future Appointments  ?Date Time Provider [place]  ?07/17/2021 10:35 AM Nehemiah Settle Tanna Savoy, DO [facility] None  ?07/31/2021 11:15 AM Truett Mainland, DO [facility] None  ?08/14/2021 10:55 AM Nehemiah Settle Tanna Savoy, DO [facility] None  ?08/28/2021 11:15 AM Truett Mainland, DO [facility] None  ?09/04/2021 11:15 AM Anyanwu, Sallyanne Havers, MD [facility] None  ?09/11/2021 10:55 AM Anyanwu, Sallyanne Havers, MD [facility] None  ?09/18/2021 11:15 AM Donnamae Jude, MD [facility] None  ? ? ?Truett Mainland, DO ?

## 2021-06-26 LAB — CBC
Hematocrit: 39.2 % (ref 34.0–46.6)
Hemoglobin: 13.7 g/dL (ref 11.1–15.9)
MCH: 30.4 pg (ref 26.6–33.0)
MCHC: 34.9 g/dL (ref 31.5–35.7)
MCV: 87 fL (ref 79–97)
Platelets: 212 10*3/uL (ref 150–450)
RBC: 4.51 x10E6/uL (ref 3.77–5.28)
RDW: 12.5 % (ref 11.7–15.4)
WBC: 4.5 10*3/uL (ref 3.4–10.8)

## 2021-06-26 LAB — RPR: RPR Ser Ql: NONREACTIVE

## 2021-06-26 LAB — HIV ANTIBODY (ROUTINE TESTING W REFLEX): HIV Screen 4th Generation wRfx: NONREACTIVE

## 2021-06-26 LAB — GLUCOSE TOLERANCE, 2 HOURS W/ 1HR
Glucose, 1 hour: 52 mg/dL — ABNORMAL LOW (ref 70–179)
Glucose, 2 hour: 50 mg/dL — ABNORMAL LOW (ref 70–152)
Glucose, Fasting: 66 mg/dL — ABNORMAL LOW (ref 70–91)

## 2021-07-17 ENCOUNTER — Ambulatory Visit (INDEPENDENT_AMBULATORY_CARE_PROVIDER_SITE_OTHER): Payer: Medicaid Other | Admitting: Family Medicine

## 2021-07-17 VITALS — BP 116/60 | HR 84 | Wt 156.0 lb

## 2021-07-17 DIAGNOSIS — Z3A30 30 weeks gestation of pregnancy: Secondary | ICD-10-CM

## 2021-07-17 DIAGNOSIS — Z3480 Encounter for supervision of other normal pregnancy, unspecified trimester: Secondary | ICD-10-CM

## 2021-07-17 NOTE — Progress Notes (Signed)
   PRENATAL VISIT NOTE  Subjective:  Emily Cantu is a 26 y.o. [id number] at [redacted]w[redacted]d being seen today for ongoing prenatal care.  She is currently monitored for the following issues for this low-risk pregnancy and has Supervision of other normal pregnancy, antepartum on their problem list.  Patient reports no complaints.  Contractions: Irritability. Vag. Bleeding: None.  Movement: Present. Denies leaking of fluid.   The following portions of the patient's history were reviewed and updated as appropriate: allergies, current medications, past family history, past medical history, past social history, past surgical history and problem list.   Objective:   Vitals:   07/17/21 1041  BP: 116/60  Pulse: 84  Weight: 156 lb (70.8 kg)    Fetal Status: Fetal Heart Rate (bpm): 128 Fundal Height: 29 cm Movement: Present     General:  Alert, oriented and cooperative. Patient is in no acute distress.  Skin: Skin is warm and dry. No rash noted.   Cardiovascular: Normal heart rate noted  Respiratory: Normal respiratory effort, no problems with respiration noted  Abdomen: Soft, gravid, appropriate for gestational age.  Pain/Pressure: Absent     Pelvic: Cervical exam deferred        Extremities: Normal range of motion.  Edema: None  Mental Status: Normal mood and affect. Normal behavior. Normal judgment and thought content.   Assessment and Plan:  Pregnancy: G5P2012 at [redacted]w[redacted]d 1. [redacted] weeks gestation of pregnancy  2. Supervision of other normal pregnancy, antepartum FHT and FH normal.  Desires waterbirth. Has taken waterbirth class. Will have patient meet with midwife.  Preterm labor symptoms and general obstetric precautions including but not limited to vaginal bleeding, contractions, leaking of fluid and fetal movement were reviewed in detail with the patient. Please refer to After Visit Summary for other counseling recommendations.   No follow-ups on file.  Future Appointments  Date Time Provider  Department Center  07/31/2021 11:15 AM Levie Heritage, DO [facility] None  08/14/2021 10:55 AM Levie Heritage, DO [facility] None  08/28/2021 11:15 AM Levie Heritage, DO [facility] None  09/02/2021  9:15 AM Aviva Signs, CNM [facility] None  09/09/2021 10:35 AM Aviva Signs, CNM [facility] None  09/16/2021 10:35 AM Aviva Signs, CNM [facility] None    Levie Heritage, DO

## 2021-07-30 IMAGING — DX DG CHEST 1V PORT
1 series · 1 of 1 positions shown · non-contrast
Comparison: 04/19/2017.

CLINICAL DATA: Fever.  Body aches.

EXAM:
PORTABLE CHEST 1 VIEW

[chest ap]
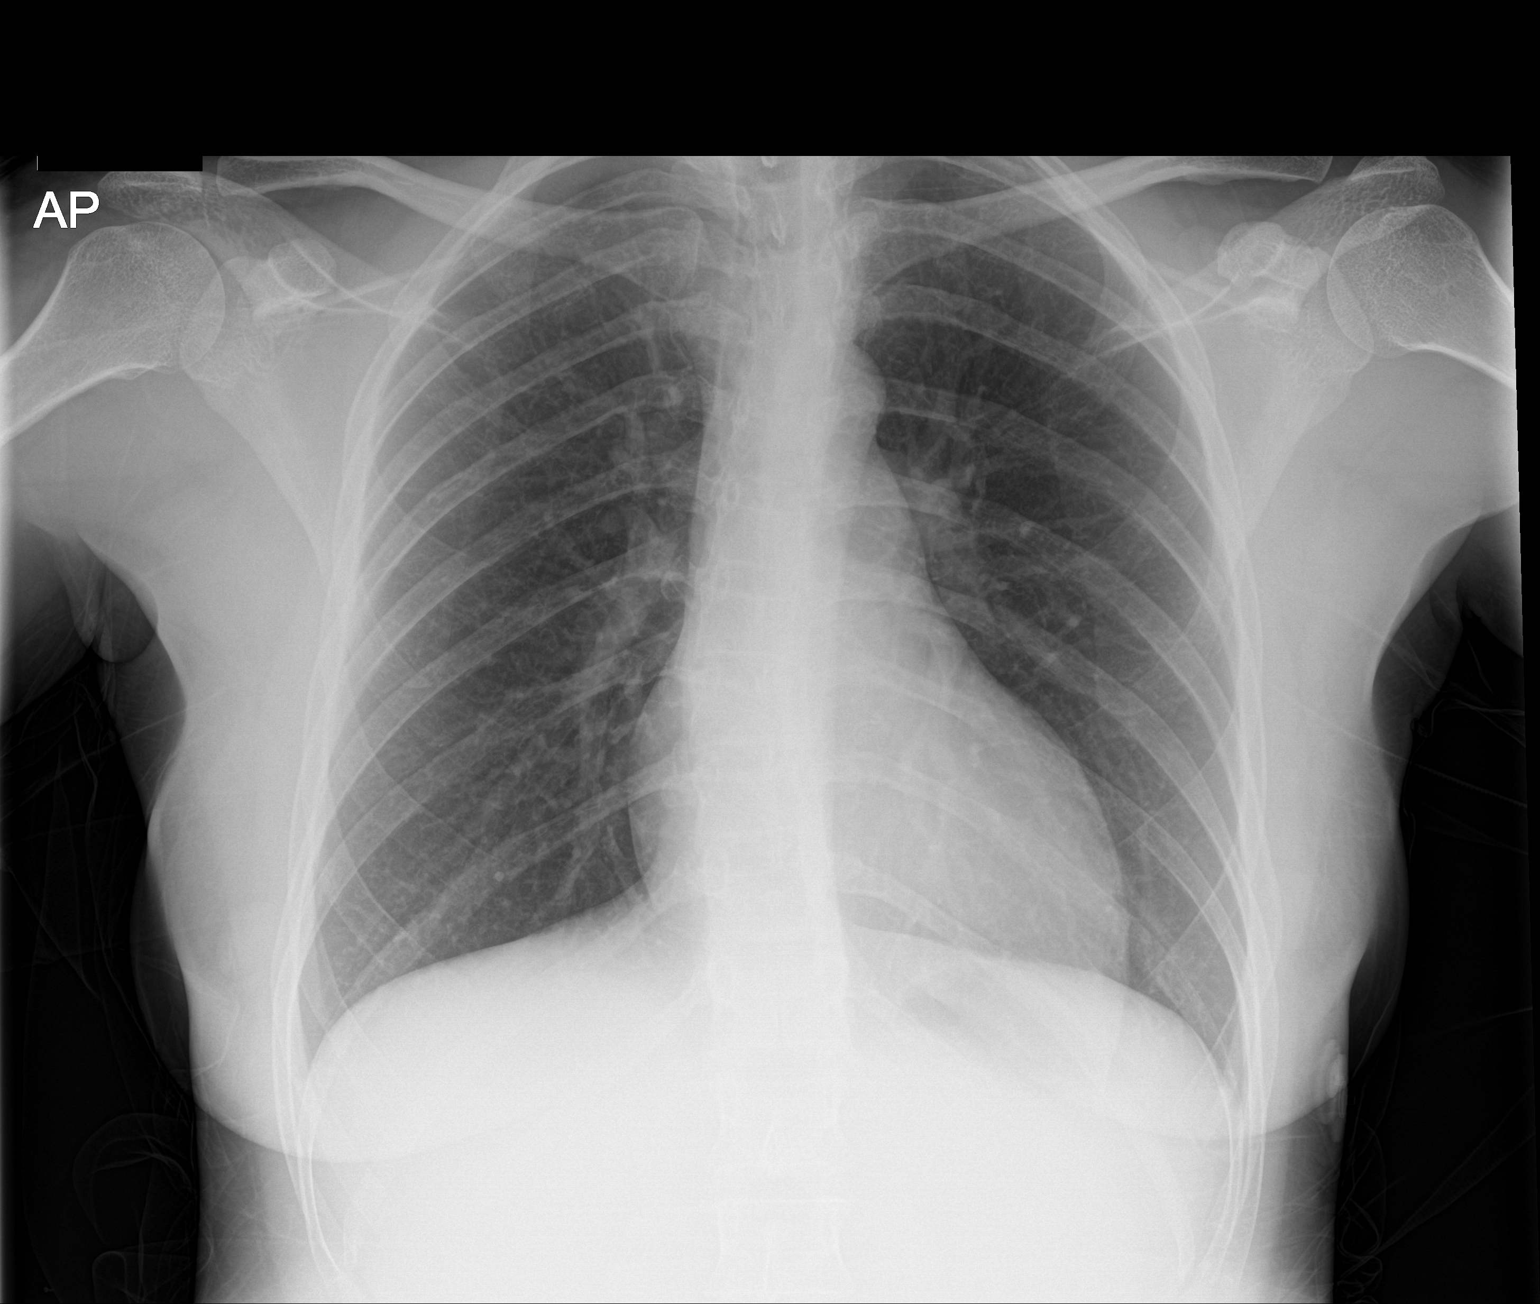

[1 of 1 positions shown; findings below may reference images not displayed]

FINDINGS: Mediastinum and hilar structures normal. Heart size normal. Lungs
are clear. No pleural effusion or pneumothorax. Thoracic spine
scoliosis again noted. No acute bony abnormality.
IMPRESSION: No acute cardiopulmonary disease.

## 2021-07-31 ENCOUNTER — Ambulatory Visit (INDEPENDENT_AMBULATORY_CARE_PROVIDER_SITE_OTHER): Payer: Medicaid Other | Admitting: Family Medicine

## 2021-07-31 VITALS — BP 112/61 | HR 75 | Wt 157.0 lb

## 2021-07-31 DIAGNOSIS — Z3A32 32 weeks gestation of pregnancy: Secondary | ICD-10-CM

## 2021-07-31 DIAGNOSIS — Z3480 Encounter for supervision of other normal pregnancy, unspecified trimester: Secondary | ICD-10-CM

## 2021-07-31 NOTE — Progress Notes (Signed)
   PRENATAL VISIT NOTE  Subjective:  Emily Cantu is a 26 y.o. [id number] at [redacted]w[redacted]d being seen today for ongoing prenatal care.  She is currently monitored for the following issues for this low-risk pregnancy and has Supervision of other normal pregnancy, antepartum on their problem list.  Patient reports  right hip pain .  Contractions: Irritability. Vag. Bleeding: None.  Movement: Present. Denies leaking of fluid.   The following portions of the patient's history were reviewed and updated as appropriate: allergies, current medications, past family history, past medical history, past social history, past surgical history and problem list.   Objective:   Vitals:   07/31/21 1128  BP: 112/61  Pulse: 75  Weight: 157 lb (71.2 kg)    Fetal Status: Fetal Heart Rate (bpm): 130 Fundal Height: 32 cm Movement: Present     General:  Alert, oriented and cooperative. Patient is in no acute distress.  Skin: Skin is warm and dry. No rash noted.   Cardiovascular: Normal heart rate noted  Respiratory: Normal respiratory effort, no problems with respiration noted  Abdomen: Soft, gravid, appropriate for gestational age.  Pain/Pressure: Absent     Pelvic: Cervical exam deferred        Extremities: Normal range of motion.  Edema: None  Mental Status: Normal mood and affect. Normal behavior. Normal judgment and thought content.   Assessment and Plan:  Pregnancy: [id number] at [redacted]w[redacted]d 1. Supervision of other normal pregnancy, antepartum FHT and FH normal  2. [redacted] weeks gestation of pregnancy   Preterm labor symptoms and general obstetric precautions including but not limited to vaginal bleeding, contractions, leaking of fluid and fetal movement were reviewed in detail with the patient. Please refer to After Visit Summary for other counseling recommendations.   No follow-ups on file.  Future Appointments  Date Time Provider [place]  08/14/2021 10:55 AM Truett Mainland, DO [facility] None   08/28/2021 11:15 AM Truett Mainland, DO [facility] None  09/02/2021  9:15 AM Seabron Spates, CNM [facility] None  09/09/2021 10:35 AM Seabron Spates, CNM [facility] None  09/16/2021 10:35 AM Seabron Spates, CNM [facility] None    Truett Mainland, DO

## 2021-08-14 ENCOUNTER — Ambulatory Visit (INDEPENDENT_AMBULATORY_CARE_PROVIDER_SITE_OTHER): Payer: Medicaid Other | Admitting: Family Medicine

## 2021-08-14 VITALS — BP 111/53 | HR 68 | Wt 160.0 lb

## 2021-08-14 DIAGNOSIS — Z3480 Encounter for supervision of other normal pregnancy, unspecified trimester: Secondary | ICD-10-CM

## 2021-08-14 DIAGNOSIS — Z3A34 34 weeks gestation of pregnancy: Secondary | ICD-10-CM

## 2021-08-14 NOTE — Progress Notes (Signed)
Patient complaining of headache. Armandina Stammer RN

## 2021-08-14 NOTE — Progress Notes (Signed)
   PRENATAL VISIT NOTE  Subjective:  Emily Cantu is a 26 y.o. [id number] at [redacted]w[redacted]d being seen today for ongoing prenatal care.  She is currently monitored for the following issues for this low-risk pregnancy and has Supervision of other normal pregnancy, antepartum on their problem list.  Patient reports  occasional headache .  Contractions: Irritability. Vag. Bleeding: None.  Movement: Present. Denies leaking of fluid.   The following portions of the patient's history were reviewed and updated as appropriate: allergies, current medications, past family history, past medical history, past social history, past surgical history and problem list.   Objective:   Vitals:   08/14/21 1055  BP: (!) 111/53  Pulse: 68  Weight: 160 lb (72.6 kg)    Fetal Status: Fetal Heart Rate (bpm): 145 Fundal Height: 34 cm Movement: Present     General:  Alert, oriented and cooperative. Patient is in no acute distress.  Skin: Skin is warm and dry. No rash noted.   Cardiovascular: Normal heart rate noted  Respiratory: Normal respiratory effort, no problems with respiration noted  Abdomen: Soft, gravid, appropriate for gestational age.  Pain/Pressure: Absent     Pelvic: Cervical exam deferred        Extremities: Normal range of motion.  Edema: None  Mental Status: Normal mood and affect. Normal behavior. Normal judgment and thought content.   Assessment and Plan:  Pregnancy: G5P2012 at [redacted]w[redacted]d 1. [redacted] weeks gestation of pregnancy  2. Supervision of other normal pregnancy, antepartum FHT and FH normal  Preterm labor symptoms and general obstetric precautions including but not limited to vaginal bleeding, contractions, leaking of fluid and fetal movement were reviewed in detail with the patient. Please refer to After Visit Summary for other counseling recommendations.   No follow-ups on file.  Future Appointments  Date Time Provider Department Center  08/28/2021 11:15 AM Levie Heritage, DO [facility] None   09/02/2021  9:15 AM Aviva Signs, CNM [facility] None  09/09/2021 10:35 AM Aviva Signs, CNM [facility] None  09/16/2021 10:35 AM Aviva Signs, CNM [facility] None    Levie Heritage, DO

## 2021-08-28 ENCOUNTER — Ambulatory Visit (INDEPENDENT_AMBULATORY_CARE_PROVIDER_SITE_OTHER): Payer: Medicaid Other | Admitting: Family Medicine

## 2021-08-28 ENCOUNTER — Other Ambulatory Visit (HOSPITAL_COMMUNITY)
Admission: RE | Admit: 2021-08-28 | Discharge: 2021-08-28 | Disposition: A | Discharge status: Home / Self Care | Payer: Medicaid Other | Source: Ambulatory Visit | Attending: Family Medicine | Admitting: Family Medicine

## 2021-08-28 VITALS — BP 104/59 | HR 73 | Wt 161.0 lb

## 2021-08-28 DIAGNOSIS — Z3483 Encounter for supervision of other normal pregnancy, third trimester: Secondary | ICD-10-CM | POA: Diagnosis not present

## 2021-08-28 DIAGNOSIS — Z3A36 36 weeks gestation of pregnancy: Secondary | ICD-10-CM

## 2021-08-28 DIAGNOSIS — Z3493 Encounter for supervision of normal pregnancy, unspecified, third trimester: Secondary | ICD-10-CM | POA: Diagnosis present

## 2021-08-28 DIAGNOSIS — Z3480 Encounter for supervision of other normal pregnancy, unspecified trimester: Secondary | ICD-10-CM

## 2021-08-28 NOTE — Progress Notes (Signed)
   PRENATAL VISIT NOTE  Subjective:  Emily Cantu is a 26 y.o. [id number] at [redacted]w[redacted]d being seen today for ongoing prenatal care.  She is currently monitored for the following issues for this low-risk pregnancy and has Supervision of other normal pregnancy, antepartum on their problem list.  Patient reports occasional contractions and right hip pain .  Contractions: Irritability. Vag. Bleeding: None.  Movement: Present. Denies leaking of fluid.   The following portions of the patient's history were reviewed and updated as appropriate: allergies, current medications, past family history, past medical history, past social history, past surgical history and problem list.   Objective:   Vitals:   08/28/21 1113  BP: (!) 104/59  Pulse: 73  Weight: 161 lb (73 kg)    Fetal Status: Fetal Heart Rate (bpm): 128 Fundal Height: 36 cm Movement: Present  Presentation: Vertex  General:  Alert, oriented and cooperative. Patient is in no acute distress.  Skin: Skin is warm and dry. No rash noted.   Cardiovascular: Normal heart rate noted  Respiratory: Normal respiratory effort, no problems with respiration noted  Abdomen: Soft, gravid, appropriate for gestational age.  Pain/Pressure: Present     Pelvic: Cervical exam performed in the presence of a chaperone Dilation: 1 Effacement (%): 50 Station: -3  Extremities: Normal range of motion.  Edema: None  Mental Status: Normal mood and affect. Normal behavior. Normal judgment and thought content.   Assessment and Plan:  Pregnancy: G5P2012 at [redacted]w[redacted]d 1. [redacted] weeks gestation of pregnancy - Culture, beta strep (group b only) - GC/Chlamydia probe amp ([place])not at [facility]  2. Supervision of other normal pregnancy, antepartum FHT and FH normal Not sure about doing a water birth. Assured patient that she can change her mind. Meeting with CNM next week.  Preterm labor symptoms and general obstetric precautions including but not limited to vaginal bleeding,  contractions, leaking of fluid and fetal movement were reviewed in detail with the patient. Please refer to After Visit Summary for other counseling recommendations.   No follow-ups on file.  Future Appointments  Date Time Provider Department Center  09/02/2021  9:15 AM Aviva Signs, CNM [facility] None  09/09/2021 10:35 AM Aviva Signs, CNM [facility] None  09/16/2021 10:35 AM Aviva Signs, CNM [facility] None    Levie Heritage, DO

## 2021-08-29 LAB — GC/CHLAMYDIA PROBE AMP (~~LOC~~) NOT AT ARMC
Chlamydia: NEGATIVE
Comment: NEGATIVE
Comment: NORMAL
Neisseria Gonorrhea: NEGATIVE

## 2021-09-01 LAB — CULTURE, BETA STREP (GROUP B ONLY): Strep Gp B Culture: NEGATIVE

## 2021-09-02 ENCOUNTER — Ambulatory Visit (INDEPENDENT_AMBULATORY_CARE_PROVIDER_SITE_OTHER): Payer: Medicaid Other | Admitting: Advanced Practice Midwife

## 2021-09-02 VITALS — BP 115/73 | HR 73 | Wt 163.0 lb

## 2021-09-02 DIAGNOSIS — Z3A37 37 weeks gestation of pregnancy: Secondary | ICD-10-CM

## 2021-09-02 DIAGNOSIS — Z3480 Encounter for supervision of other normal pregnancy, unspecified trimester: Secondary | ICD-10-CM

## 2021-09-02 NOTE — Progress Notes (Signed)
   PRENATAL VISIT NOTE  Subjective:  Emily Cantu is a 26 y.o. B2I2035 at [redacted]w[redacted]d being seen today for ongoing prenatal care.  She is currently monitored for the following issues for this low-risk pregnancy and has Supervision of other normal pregnancy, antepartum on their problem list.  Patient reports no complaints.  Contractions: Irritability. Vag. Bleeding: None.  Movement: Present. Denies leaking of fluid.   The following portions of the patient's history were reviewed and updated as appropriate: allergies, current medications, past family history, past medical history, past social history, past surgical history and problem list.   Objective:   Vitals:   09/02/21 0920  BP: 115/73  Pulse: 73  Weight: 163 lb (73.9 kg)    Fetal Status: Fetal Heart Rate (bpm): 125   Movement: Present     General:  Alert, oriented and cooperative. Patient is in no acute distress.  Skin: Skin is warm and dry. No rash noted.   Cardiovascular: Normal heart rate noted  Respiratory: Normal respiratory effort, no problems with respiration noted  Abdomen: Soft, gravid, appropriate for gestational age.  Pain/Pressure: Present     Pelvic: Cervical exam deferred        Extremities: Normal range of motion.  Edema: None  Mental Status: Normal mood and affect. Normal behavior. Normal judgment and thought content.   Assessment and Plan:  Pregnancy: [id number] at [redacted]w[redacted]d 1. Supervision of other normal pregnancy, antepartum      Discussed Waterbirth in detail      Has gone to class but forgot certificate      Not sure she wants to do WB, discussed she can decide whenever she wants to   Had epidurals with other births.  Needs to bring certificate in  2. [redacted] weeks gestation of pregnancy     Discussed contraception.  Initially wanted BTL but now not sure because she does not want anesthesia problems. Discussed 30 day consent, declines to sign today      Discussed other methods of LARC, considering IUD  Term labor  symptoms and general obstetric precautions including but not limited to vaginal bleeding, contractions, leaking of fluid and fetal movement were reviewed in detail with the patient. Please refer to After Visit Summary for other counseling recommendations.   No follow-ups on file.  Future Appointments  Date Time Provider Department Center  09/09/2021 10:35 AM Aviva Signs, CNM [facility] None  09/16/2021 10:35 AM Aviva Signs, CNM [facility] None    Wynelle Bourgeois, CNM

## 2021-09-04 ENCOUNTER — Encounter: Payer: Medicaid Other | Admitting: Obstetrics & Gynecology

## 2021-09-09 ENCOUNTER — Ambulatory Visit: Payer: Medicaid Other | Admitting: Advanced Practice Midwife

## 2021-09-09 VITALS — BP 105/62 | HR 71 | Wt 163.0 lb

## 2021-09-11 ENCOUNTER — Encounter: Payer: Medicaid Other | Admitting: Obstetrics & Gynecology

## 2021-09-14 ENCOUNTER — Inpatient Hospital Stay (HOSPITAL_COMMUNITY)
Admission: AD | Admit: 2021-09-14 | Discharge: 2021-09-16 | DRG: 807 | Disposition: A | Discharge status: Home / Self Care | Payer: Medicaid Other | Attending: Obstetrics & Gynecology | Admitting: Obstetrics & Gynecology

## 2021-09-14 ENCOUNTER — Encounter (HOSPITAL_COMMUNITY): Payer: Self-pay | Admitting: Obstetrics & Gynecology

## 2021-09-14 ENCOUNTER — Inpatient Hospital Stay (HOSPITAL_COMMUNITY): Payer: Medicaid Other | Admitting: Anesthesiology

## 2021-09-14 ENCOUNTER — Other Ambulatory Visit: Payer: Self-pay

## 2021-09-14 ENCOUNTER — Inpatient Hospital Stay (EMERGENCY_DEPARTMENT_HOSPITAL)
Admission: AD | Admit: 2021-09-14 | Discharge: 2021-09-14 | Disposition: A | Discharge status: Home / Self Care | Payer: Medicaid Other | Source: Home / Self Care | Attending: Obstetrics & Gynecology | Admitting: Obstetrics & Gynecology

## 2021-09-14 ENCOUNTER — Encounter (HOSPITAL_COMMUNITY): Payer: Self-pay | Admitting: Family Medicine

## 2021-09-14 DIAGNOSIS — O26893 Other specified pregnancy related conditions, third trimester: Principal | ICD-10-CM | POA: Diagnosis present

## 2021-09-14 DIAGNOSIS — O479 False labor, unspecified: Secondary | ICD-10-CM | POA: Diagnosis not present

## 2021-09-14 DIAGNOSIS — Z3A38 38 weeks gestation of pregnancy: Secondary | ICD-10-CM | POA: Insufficient documentation

## 2021-09-14 DIAGNOSIS — Z3480 Encounter for supervision of other normal pregnancy, unspecified trimester: Secondary | ICD-10-CM

## 2021-09-14 DIAGNOSIS — O471 False labor at or after 37 completed weeks of gestation: Secondary | ICD-10-CM | POA: Insufficient documentation

## 2021-09-14 DIAGNOSIS — Z3689 Encounter for other specified antenatal screening: Secondary | ICD-10-CM

## 2021-09-14 LAB — TYPE AND SCREEN
ABO/RH(D): O POS
Antibody Screen: NEGATIVE

## 2021-09-14 LAB — CBC
HCT: 38.3 % (ref 36.0–46.0)
Hemoglobin: 13.1 g/dL (ref 12.0–15.0)
MCH: 30.7 pg (ref 26.0–34.0)
MCHC: 34.2 g/dL (ref 30.0–36.0)
MCV: 89.7 fL (ref 80.0–100.0)
Platelets: 189 10*3/uL (ref 150–400)
RBC: 4.27 MIL/uL (ref 3.87–5.11)
RDW: 13.2 % (ref 11.5–15.5)
WBC: 8.8 10*3/uL (ref 4.0–10.5)
nRBC: 0 % (ref 0.0–0.2)

## 2021-09-14 LAB — RPR: RPR Ser Ql: NONREACTIVE

## 2021-09-14 MED ORDER — LACTATED RINGERS IV SOLN: 500.0000 mL | INTRAVENOUS | Status: DC | PRN

## 2021-09-14 MED ORDER — OXYTOCIN BOLUS FROM INFUSION
333.0000 mL | Freq: Once | INTRAVENOUS | Status: AC
  Administered 2021-09-14: 333 mL via INTRAVENOUS

## 2021-09-14 MED ORDER — OXYCODONE-ACETAMINOPHEN 5-325 MG PO TABS: 2.0000 | ORAL_TABLET | ORAL | Status: DC | PRN

## 2021-09-14 MED ORDER — LIDOCAINE HCL (PF) 1 % IJ SOLN
INTRAMUSCULAR | Status: DC | PRN
  Administered 2021-09-14: 2 mL via EPIDURAL
  Administered 2021-09-14: 10 mL via EPIDURAL

## 2021-09-14 MED ORDER — ONDANSETRON HCL 4 MG/2ML IJ SOLN: 4.0000 mg | Freq: Four times a day (QID) | INTRAMUSCULAR | Status: DC | PRN

## 2021-09-14 MED ORDER — DIBUCAINE (PERIANAL) 1 % EX OINT: 1.0000 | TOPICAL_OINTMENT | CUTANEOUS | Status: DC | PRN

## 2021-09-14 MED ORDER — AMMONIA AROMATIC IN INHA
RESPIRATORY_TRACT | Status: AC
  Filled 2021-09-14: qty 10

## 2021-09-14 MED ORDER — FENTANYL CITRATE (PF) 100 MCG/2ML IJ SOLN
50.0000 ug | Freq: Once | INTRAMUSCULAR | Status: AC
  Administered 2021-09-14: 50 ug via INTRAMUSCULAR
  Filled 2021-09-14: qty 2

## 2021-09-14 MED ORDER — SOD CITRATE-CITRIC ACID 500-334 MG/5ML PO SOLN: 30.0000 mL | ORAL | Status: DC | PRN

## 2021-09-14 MED ORDER — TERBUTALINE SULFATE 1 MG/ML IJ SOLN: 0.2500 mg | Freq: Once | INTRAMUSCULAR | Status: DC | PRN

## 2021-09-14 MED ORDER — ACETAMINOPHEN 325 MG PO TABS
650.0000 mg | ORAL_TABLET | ORAL | Status: DC | PRN
  Administered 2021-09-14: 650 mg via ORAL
  Filled 2021-09-14: qty 2

## 2021-09-14 MED ORDER — OXYTOCIN-SODIUM CHLORIDE 30-0.9 UT/500ML-% IV SOLN
1.0000 m[IU]/min | INTRAVENOUS | Status: DC
  Administered 2021-09-14: 2 m[IU]/min via INTRAVENOUS

## 2021-09-14 MED ORDER — WITCH HAZEL-GLYCERIN EX PADS: 1.0000 | MEDICATED_PAD | CUTANEOUS | Status: DC | PRN

## 2021-09-14 MED ORDER — LACTATED RINGERS IV SOLN: INTRAVENOUS | Status: DC

## 2021-09-14 MED ORDER — COCONUT OIL OIL: 1.0000 | TOPICAL_OIL | Status: DC | PRN

## 2021-09-14 MED ORDER — EPHEDRINE 5 MG/ML INJ: 10.0000 mg | INTRAVENOUS | Status: DC | PRN

## 2021-09-14 MED ORDER — ONDANSETRON HCL 4 MG PO TABS: 4.0000 mg | ORAL_TABLET | ORAL | Status: DC | PRN

## 2021-09-14 MED ORDER — SENNOSIDES-DOCUSATE SODIUM 8.6-50 MG PO TABS
2.0000 | ORAL_TABLET | ORAL | Status: DC
  Administered 2021-09-15 – 2021-09-16 (×2): 2 via ORAL
  Filled 2021-09-14 (×2): qty 2

## 2021-09-14 MED ORDER — PHENYLEPHRINE 80 MCG/ML (10ML) SYRINGE FOR IV PUSH (FOR BLOOD PRESSURE SUPPORT)
80.0000 ug | PREFILLED_SYRINGE | INTRAVENOUS | Status: DC | PRN
  Filled 2021-09-14: qty 10

## 2021-09-14 MED ORDER — ZOLPIDEM TARTRATE 5 MG PO TABS: 5.0000 mg | ORAL_TABLET | Freq: Every evening | ORAL | 0 refills | Status: DC | PRN

## 2021-09-14 MED ORDER — OXYTOCIN-SODIUM CHLORIDE 30-0.9 UT/500ML-% IV SOLN
2.5000 [IU]/h | INTRAVENOUS | Status: DC
  Administered 2021-09-14: 2.5 [IU]/h via INTRAVENOUS
  Filled 2021-09-14: qty 500

## 2021-09-14 MED ORDER — ACETAMINOPHEN 325 MG PO TABS
650.0000 mg | ORAL_TABLET | ORAL | Status: DC | PRN
  Administered 2021-09-15 – 2021-09-16 (×4): 650 mg via ORAL
  Filled 2021-09-14 (×4): qty 2

## 2021-09-14 MED ORDER — DIPHENHYDRAMINE HCL 50 MG/ML IJ SOLN: 12.5000 mg | INTRAMUSCULAR | Status: DC | PRN

## 2021-09-14 MED ORDER — LACTATED RINGERS IV SOLN
500.0000 mL | Freq: Once | INTRAVENOUS | Status: AC
Start: 2021-09-14 — End: 2021-09-14
  Administered 2021-09-14: 500 mL via INTRAVENOUS

## 2021-09-14 MED ORDER — ONDANSETRON HCL 4 MG/2ML IJ SOLN: 4.0000 mg | INTRAMUSCULAR | Status: DC | PRN

## 2021-09-14 MED ORDER — FLEET ENEMA 7-19 GM/118ML RE ENEM: 1.0000 | ENEMA | RECTAL | Status: DC | PRN

## 2021-09-14 MED ORDER — FENTANYL-BUPIVACAINE-NACL 0.5-0.125-0.9 MG/250ML-% EP SOLN
EPIDURAL | Status: DC | PRN
  Administered 2021-09-14: 12 mL/h via EPIDURAL

## 2021-09-14 MED ORDER — LIDOCAINE HCL (PF) 1 % IJ SOLN: 30.0000 mL | INTRAMUSCULAR | Status: DC | PRN

## 2021-09-14 MED ORDER — TETANUS-DIPHTH-ACELL PERTUSSIS 5-2.5-18.5 LF-MCG/0.5 IM SUSY: 0.5000 mL | PREFILLED_SYRINGE | Freq: Once | INTRAMUSCULAR | Status: DC

## 2021-09-14 MED ORDER — IBUPROFEN 600 MG PO TABS
600.0000 mg | ORAL_TABLET | Freq: Four times a day (QID) | ORAL | Status: DC
  Administered 2021-09-14 – 2021-09-16 (×7): 600 mg via ORAL
  Filled 2021-09-14 (×7): qty 1

## 2021-09-14 MED ORDER — PRENATAL MULTIVITAMIN CH
1.0000 | ORAL_TABLET | Freq: Every day | ORAL | Status: DC
  Administered 2021-09-15 – 2021-09-16 (×2): 1 via ORAL
  Filled 2021-09-14 (×2): qty 1

## 2021-09-14 MED ORDER — OXYCODONE-ACETAMINOPHEN 5-325 MG PO TABS: 1.0000 | ORAL_TABLET | ORAL | Status: DC | PRN

## 2021-09-14 MED ORDER — DIPHENHYDRAMINE HCL 25 MG PO CAPS: 25.0000 mg | ORAL_CAPSULE | Freq: Four times a day (QID) | ORAL | Status: DC | PRN

## 2021-09-14 MED ORDER — ZOLPIDEM TARTRATE 5 MG PO TABS: 5.0000 mg | ORAL_TABLET | Freq: Every evening | ORAL | Status: DC | PRN

## 2021-09-14 MED ORDER — FENTANYL-BUPIVACAINE-NACL 0.5-0.125-0.9 MG/250ML-% EP SOLN
12.0000 mL/h | EPIDURAL | Status: DC | PRN
  Filled 2021-09-14: qty 250

## 2021-09-14 MED ORDER — PHENYLEPHRINE 80 MCG/ML (10ML) SYRINGE FOR IV PUSH (FOR BLOOD PRESSURE SUPPORT): 80.0000 ug | PREFILLED_SYRINGE | INTRAVENOUS | Status: DC | PRN

## 2021-09-14 MED ORDER — SIMETHICONE 80 MG PO CHEW: 80.0000 mg | CHEWABLE_TABLET | ORAL | Status: DC | PRN

## 2021-09-14 MED ORDER — BENZOCAINE-MENTHOL 20-0.5 % EX AERO: 1.0000 | INHALATION_SPRAY | CUTANEOUS | Status: DC | PRN

## 2021-09-14 NOTE — Anesthesia Preprocedure Evaluation (Signed)
Anesthesia Evaluation  Patient identified by MRN, date of birth, ID band Patient awake    Reviewed: Allergy & Precautions, Patient's Chart, lab work & pertinent test results  Airway Mallampati: II  TM Distance: >3 FB Neck ROM: Full    Dental no notable dental hx.    Pulmonary neg pulmonary ROS,    Pulmonary exam normal breath sounds clear to auscultation       Cardiovascular negative cardio ROS Normal cardiovascular exam Rhythm:Regular Rate:Normal     Neuro/Psych PSYCHIATRIC DISORDERS Depression negative neurological ROS     GI/Hepatic negative GI ROS, Neg liver ROS,   Endo/Other  negative endocrine ROS  Renal/GU negative Renal ROS  negative genitourinary   Musculoskeletal negative musculoskeletal ROS (+)   Abdominal   Peds negative pediatric ROS (+)  Hematology negative hematology ROS (+) Hb 13.1, plt 189   Anesthesia Other Findings   Reproductive/Obstetrics (+) Pregnancy                             Anesthesia Physical Anesthesia Plan  ASA: 2  Anesthesia Plan: Epidural   Post-op Pain Management:    Induction:   PONV Risk Score and Plan: 2  Airway Management Planned: Natural Airway  Additional Equipment: None  Intra-op Plan:   Post-operative Plan:   Informed Consent: I have reviewed the patients History and Physical, chart, labs and discussed the procedure including the risks, benefits and alternatives for the proposed anesthesia with the patient or authorized representative who has indicated his/her understanding and acceptance.       Plan Discussed with:   Anesthesia Plan Comments:         Anesthesia Quick Evaluation

## 2021-09-14 NOTE — Anesthesia Procedure Notes (Signed)
Epidural Patient location during procedure: OB Start time: 09/14/2021 11:29 AM End time: 09/14/2021 11:36 AM  Staffing Anesthesiologist: Lannie Fields, DO Performed: anesthesiologist   Preanesthetic Checklist Completed: patient identified, IV checked, risks and benefits discussed, monitors and equipment checked, pre-op evaluation and timeout performed  Epidural Patient position: sitting Prep: DuraPrep and site prepped and draped Patient monitoring: continuous pulse ox, blood pressure, heart rate and cardiac monitor Approach: midline Location: L3-L4 Injection technique: LOR air  Needle:  Needle type: Tuohy  Needle gauge: 17 G Needle length: 9 cm Needle insertion depth: 6 cm Catheter type: closed end flexible Catheter size: 19 Gauge Catheter at skin depth: 11 cm Test dose: negative  Assessment Sensory level: T8 Events: blood not aspirated, injection not painful, no injection resistance, no paresthesia and negative IV test  Additional Notes Patient identified. Risks/Benefits/Options discussed with patient including but not limited to bleeding, infection, nerve damage, paralysis, failed block, incomplete pain control, headache, blood pressure changes, nausea, vomiting, reactions to medication both or allergic, itching and postpartum back pain. Confirmed with bedside nurse the patient's most recent platelet count. Confirmed with patient that they are not currently taking any anticoagulation, have any bleeding history or any family history of bleeding disorders. Patient expressed understanding and wished to proceed. All questions were answered. Sterile technique was used throughout the entire procedure. Please see nursing notes for vital signs. Test dose was given through epidural catheter and negative prior to continuing to dose epidural or start infusion. Warning signs of high block given to the patient including shortness of breath, tingling/numbness in hands, complete motor  block, or any concerning symptoms with instructions to call for help. Patient was given instructions on fall risk and not to get out of bed. All questions and concerns addressed with instructions to call with any issues or inadequate analgesia.  Reason for block:procedure for pain

## 2021-09-14 NOTE — H&P (Signed)
OBSTETRIC ADMISSION HISTORY AND PHYSICAL  Emily Cantu is a 26 y.o. female [phone] with IUP at [redacted]w[redacted]d by 6 wk [place] presenting for SOL. She reports +FMs, No LOF, no VB, no blurry vision, headaches or peripheral edema, and RUQ pain.  She plans on breast feeding. She request outpatient IUD for birth control. She received her prenatal care at  [facility]     Dating: By 6 --->  Estimated Date of Delivery: 09/23/21  Sono:    @[redacted]w[redacted]d , CWD, normal anatomy,  cephalic presentation, right lateral placenta, 251g, 27% EFW   Prenatal History/Complications: none  Past Medical History: Past Medical History:  Diagnosis Date   Abortion    Back pain affecting pregnancy    Chlamydia infection affecting pregnancy in third trimester 06/18/2019   Depression    Scoliosis    Subchorionic hematoma    Subchorionic hematoma in first trimester 11/01/2018   Bleeding precations Yolk sac seen at 5 wks Recheck 01/01/2019 in 2-3 weeks   Vaginal delivery 12/24/2016    Past Surgical History: Past Surgical History:  Procedure Laterality Date   NO PAST SURGERIES     THERAPEUTIC ABORTION  05/28/2018    Obstetrical History: OB History     Gravida  5   Para  2   Term  2   Preterm      AB  1   Living  2      SAB      IAB  1   Ectopic      Multiple  0   Live Births  2           Social History Social History   Socioeconomic History   Marital status: Single    Spouse name: Not on file   Number of children: 1   Years of education: Not on file   Highest education level: Not on file  Occupational History   Not on file  Tobacco Use   Smoking status: Never   Smokeless tobacco: Never  Vaping Use   Vaping Use: Never used  Substance and Sexual Activity   Alcohol use: No   Drug use: No   Sexual activity: Yes    Birth control/protection: None  Other Topics Concern   Not on file  Social History Narrative   Not on file   Social Determinants of Health   Financial Resource Strain: Not on file  Food  Insecurity: Not on file  Transportation Needs: Not on file  Physical Activity: Not on file  Stress: Not on file  Social Connections: Not on file    Family History: Family History  Problem Relation Age of Onset   Hypertension Mother    Cancer Father    Hypertension Maternal Grandmother     Allergies: No Known Allergies  Medications Prior to Admission  Medication Sig Dispense Refill Last Dose   Prenatal Vit-Fe Fumarate-FA (PRENATAL VITAMINS PO) Take by mouth.   09/13/2021   butalbital-acetaminophen-caffeine (FIORICET) 50-325-40 MG tablet Take 1-2 tablets by mouth every 6 (six) hours as needed for headache. (Patient not taking: Reported on 04/29/2021) 10 tablet 0    Magnesium Oxide 200 MG TABS Take 1-2 tablets (200-400 mg total) by mouth at bedtime. (Patient not taking: Reported on 04/29/2021) 60 tablet 2    ondansetron (ZOFRAN-ODT) 4 MG disintegrating tablet Take 1 tablet (4 mg total) by mouth every 6 (six) hours as needed for nausea. (Patient not taking: Reported on 04/29/2021) 30 tablet 3    terconazole (TERAZOL 7)  0.4 % vaginal cream Place 1 applicator vaginally at bedtime. Use for seven days (Patient not taking: Reported on 07/17/2021) 45 g 0    zolpidem (AMBIEN) 5 MG tablet Take 1 tablet (5 mg total) by mouth at bedtime as needed for up to 4 days for sleep. 4 tablet 0      Review of Systems   All systems reviewed and negative except as stated in HPI  Blood pressure (!) 103/55, pulse 79, temperature 98.1 F (36.7 C), temperature source Oral, resp. rate 20, last menstrual period 12/17/2020, SpO2 100 %, unknown if currently breastfeeding. General appearance: alert, appears stated age, and no distress Lungs: clear to auscultation bilaterally Heart: regular rate Abdomen: gravid Extremities: no sign of DVT  Presentation: cephalic Fetal monitoringBaseline: 120 bpm, Variability: Good {> 6 bpm), Accelerations: None , and Decelerations: Absent Uterine activityFrequency: Every 5-7  minutes Dilation: 8.5 Effacement (%): 70 Station: -1 Exam by:: Athleen Feltner, MD   Prenatal labs: ABO, Rh: --/--/O POS (07/23 1058) Antibody: NEG (07/23 1058) Rubella: 1.27 (01/12 1002) RPR: Non Reactive (05/03 0852)  HBsAg: Negative (01/12 1002)  HIV: Non Reactive (05/03 0852)  GBS: Negative/-- (07/06 1130)  Glucola Normal  Genetic screening  LR Anatomy US Normal   Prenatal Transfer Tool  Maternal Diabetes: No Genetic Screening: Normal Maternal Ultrasounds/Referrals: Normal Fetal Ultrasounds or other Referrals:  None Maternal Substance Abuse:  No Significant Maternal Medications:  None Significant Maternal Lab Results: Group B Strep negative  Results for orders placed or performed during the hospital encounter of 09/14/21 (from the past 24 hour(s))  CBC   Collection Time: 09/14/21 10:31 AM  Result Value Ref Range   WBC 8.8 4.0 - 10.5 K/uL   RBC 4.27 3.87 - 5.11 MIL/uL   Hemoglobin 13.1 12.0 - 15.0 g/dL   HCT 38.7 56.4 - 33.2 %   MCV 89.7 80.0 - 100.0 fL   MCH 30.7 26.0 - 34.0 pg   MCHC 34.2 30.0 - 36.0 g/dL   RDW 95.1 88.4 - 16.6 %   Platelets 189 150 - 400 K/uL   nRBC 0.0 0.0 - 0.2 %  Type and screen [facility]   Collection Time: 09/14/21 10:58 AM  Result Value Ref Range   ABO/RH(D) O POS    Antibody Screen NEG    Sample Expiration      09/17/2021,2359 Performed at [facility], [address], [place], [place] [address]     Patient Active Problem List   Diagnosis Date Noted   Normal labor and delivery 09/14/2021   Supervision of other normal pregnancy, antepartum 03/06/2021    Assessment/Plan:  [name] is a 26 y.o. S0F0932 at [redacted]w[redacted]d here forSOL   #Labor: Patient presents at 7 cm.  Patient received epidural and had SROM after epidural placement.  Contractions have slowed to every 5 to 7 minutes.  Initiated low-dose Pitocin.  We will continue to monitor #Pain: Epidural in place #FWB: Category 1, reassuring #ID:  GBS  negative #MOF: Breast and formula #MOC: Outpatient IUD  Celedonio Savage, MD  09/14/2021, 1:44 PM

## 2021-09-14 NOTE — MAU Note (Signed)
Cardio applied x 4 min(0325-0329) thru [facility] FHR 130-mod var with 15x15 accels-no deceleration-audible movement-confirmed with pt.  Denies SROM, vaginal bleeding or bloody show. Pt states contractions are "same"-strength, frequency and duration. Plan SVE at 0400

## 2021-09-14 NOTE — Discharge Summary (Shared)
Postpartum Discharge Summary  Date of Service updated***     Patient Name: Emily Cantu DOB: [birth year] MRN: [id number]  Date of admission: 09/14/2021 Delivery date:09/14/2021  Delivering provider: Concepcion Living  Date of discharge: 09/14/2021  Admitting diagnosis: Normal labor and delivery [O80] Intrauterine pregnancy: [redacted]w[redacted]d    Secondary diagnosis:  Principal Problem:   Normal labor and delivery Active Problems:   Vaginal delivery   Supervision of other normal pregnancy, antepartum  Additional problems: None     Discharge diagnosis: Term Pregnancy Delivered                                              Post partum procedures: None  Augmentation: Pitocin Complications: None  Hospital course: Onset of Labor With Vaginal Delivery      26y.o. yo [id number] 34w5das admitted in Active Labor on 09/14/2021. Patient had an uncomplicated labor course as follows:  Membrane Rupture Time/Date: 12:10 PM ,09/14/2021   Delivery Method:Vaginal, Spontaneous  Episiotomy:   Lacerations:    Patient had an uncomplicated postpartum course.  She is ambulating, tolerating a regular diet, passing flatus, and urinating well. Patient is discharged home in stable condition on 09/14/21.  Newborn Data: Birth date:09/14/2021  Birth time:6:21 PM  Gender:Female  Living status:Living  Apgars:8 ,9  Weight:   Magnesium Sulfate received: No BMZ received: No Rhophylac:N/A MMR:{MMR:30440033} T-DaP:{Tdap:23962} Flu: N/A Transfusion:{Transfusion received:30440034}  Physical exam  Vitals:   09/14/21 1753 09/14/21 1827 09/14/21 1831 09/14/21 1846  BP:  108/78 123/67 (!) 108/55  Pulse:  (!) 105 96 98  Resp:      Temp: (!) 101.6 F (38.7 C)     TempSrc: Axillary     SpO2:       General: {Exam; general:21111117} Lochia: {Desc; appropriate/inappropriate:30686::"appropriate"} Uterine Fundus: {Desc; firm/soft:30687} Incision: {Exam; incision:21111123} DVT Evaluation: {Exam;  dvt:2111122} Labs: Lab Results  Component Value Date   WBC 8.8 09/14/2021   HGB 13.1 09/14/2021   HCT 38.3 09/14/2021   MCV 89.7 09/14/2021   PLT 189 09/14/2021      Latest Ref Rng & Units 01/27/2021   10:41 PM  CMP  Glucose 70 - 99 mg/dL 87   BUN 6 - 20 mg/dL <5   Creatinine 0.44 - 1.00 mg/dL 0.68   Sodium 135 - 145 mmol/L 136   Potassium 3.5 - 5.1 mmol/L 3.7   Chloride 98 - 111 mmol/L 101   CO2 22 - 32 mmol/L 25   Calcium 8.9 - 10.3 mg/dL 9.9   Total Protein 6.5 - 8.1 g/dL 7.5   Total Bilirubin 0.3 - 1.2 mg/dL 0.3   Alkaline Phos 38 - 126 U/L 58   AST 15 - 41 U/L 28   ALT 0 - 44 U/L 18    Edinburgh Score:    06/19/2019    4:49 PM  Edinburgh Postnatal Depression Scale Screening Tool  I have been able to laugh and see the funny side of things. 0  I have looked forward with enjoyment to things. 0  I have blamed myself unnecessarily when things went wrong. 1  I have been anxious or worried for no good reason. 2  I have felt scared or panicky for no good reason. 2  Things have been getting on top of me. 1  I have been so unhappy that I have had  difficulty sleeping. 1  I have felt sad or miserable. 0  I have been so unhappy that I have been crying. 0  The thought of harming myself has occurred to me. 0  Edinburgh Postnatal Depression Scale Total 7     After visit meds:  Allergies as of 09/14/2021   No Known Allergies   Med Rec must be completed prior to using this [facility]***        Discharge home in stable condition Infant Feeding: {Baby feeding:23562} Infant Disposition:{CHL IP OB HOME WITH TYYPEJ:61164} Discharge instruction: per After Visit Summary and Postpartum booklet. Activity: Advance as tolerated. Pelvic rest for 6 weeks.  Diet: {OB HDTP:12258346} Future Appointments: Future Appointments  Date Time Provider [place]  09/16/2021 10:35 AM Seabron Spates, CNM [facility] None   Follow up Visit:   Please schedule this patient for a In  person postpartum visit in 6 weeks with the following provider: Any provider. Additional Postpartum F/U: None   Low risk pregnancy complicated by:  none Delivery mode:  Vaginal, Spontaneous  Anticipated Birth Control:  IUD   09/14/2021 Concepcion Living, MD

## 2021-09-14 NOTE — Progress Notes (Signed)
LABOR PROGRESS NOTE  Emily Cantu is a 26 y.o. D4Y8144 at [redacted]w[redacted]d  admitted for SOL  Subjective: Resting right side, shivering constantly.  Objective: BP (!) 103/56   Pulse 89   Temp (!) 97.5 F (36.4 C) (Oral)   Resp 20   LMP 12/17/2020 (Approximate)   SpO2 100%  or  Vitals:   09/14/21 1431 09/14/21 1501 09/14/21 1631 09/14/21 1701  BP: (!) 94/53 (!) 92/47 (!) 95/51 (!) 103/56  Pulse: 76 76 83 89  Resp:      Temp:   (!) 97.5 F (36.4 C)   TempSrc:   Oral   SpO2:         Dilation: 10 Dilation Complete Date: 09/14/21 Dilation Complete Time: 1533 Effacement (%): 100 Cervical Position: Middle, Anterior Station: 0 Presentation: Vertex Exam by:: Dr. Nobie Putnam FHT: baseline rate 125, moderate varibility, positive acel, occasional variable deceleration Toco: Every 2 to 4 minutes  Labs: Lab Results  Component Value Date   WBC 8.8 09/14/2021   HGB 13.1 09/14/2021   HCT 38.3 09/14/2021   MCV 89.7 09/14/2021   PLT 189 09/14/2021    Patient Active Problem List   Diagnosis Date Noted   Normal labor and delivery 09/14/2021   Supervision of other normal pregnancy, antepartum 03/06/2021    Assessment / Plan: 26 y.o. Y1E5631 at [redacted]w[redacted]d here for SOL  Labor: Continuing to progress.  On cervical exam patient had forebag +2-+3 station.  Forebag ruptured with clear fluid.  The fetus is at 0 station and cervix completely dilated we will continue to monitor and titrate Pitocin as able. Fetal Wellbeing: Category 2, reassuring Pain Control: Epidural in place Anticipated MOD: Vaginal delivery  Derrel Nip, MD  [facility] 09/14/2021, 5:17 PM

## 2021-09-14 NOTE — MAU Note (Signed)
Plan of care discussed with S.Weinhold CNM-external fetal and labor monitors removed to allow patient increased mobility. Birthing ball provided-review other labor positions.  Pt to call if SROM, or increased pain, vaginal or rectal pressure. Voiced understanding.

## 2021-09-14 NOTE — MAU Note (Signed)
Emily Cantu is a 26 y.o. at 104w5d here in MAU reporting: contractions are closer together but is not sure about how often they are. Also saw mucus plug, saw some bleeding with that. No LOF.  Not sure about FM due to contraction pain.  Onset of complaint: ongoing  Pain score: 10/10  Vitals:   09/14/21 1040  BP: 128/73  Pulse: 81  Resp: 20  Temp: 98.1 F (36.7 C)  SpO2: 98%     FHT:120  Lab orders placed from triage: none

## 2021-09-14 NOTE — MAU Provider Note (Signed)
None      S: Ms. SHAYLYNNE LUNT is a 26 y.o. [phone] at [redacted]w[redacted]d  who presents to MAU today complaining contractions q six minutes for the past two days. Pain score 10/10. She denies vaginal bleeding. She denies LOF. She reports normal fetal movement.    O: BP 126/71 (BP Location: Right Arm)   Pulse 79   Temp 98.4 F (36.9 C) (Oral)   Resp 17   Ht 5' 5.5" (1.664 m)   Wt 74.8 kg   LMP 12/17/2020 (Approximate)   SpO2 97%   BMI 27.04 kg/m  GENERAL: Well-developed, well-nourished female in no acute distress.  HEAD: Normocephalic, atraumatic.  CHEST: Normal effort of breathing, regular heart rate ABDOMEN: Soft, nontender, gravid  Cervical exam:  Dilation: 4.5 Effacement (%): 80 Cervical Position: Middle Station: 0 Presentation: Vertex Exam by:: Ginnie Smart RN   Fetal Monitoring: Baseline: 120 Variability: Mod Accelerations: 15 x 15 Decelerations: N/A Contractions: initially q 3-5, then 9-13 min   A: SIUP at [redacted]w[redacted]d  Patient's home address is 11 min from [facility] Cat I tracing  Patient made change from 3.5-4.5 but contractions now 12+ min apart Patient accepted offer of pain medication with recheck in one hour Subsequently declined recheck of cervix but verbalized she feels safe going home CNM at bedside to discuss expectations for change in sensation as contractions become more frequent, reviewed inability to induce prior to 39 weeks without medical indication  Meds ordered this encounter  Medications   fentaNYL (SUBLIMAZE) injection 50 mcg   zolpidem (AMBIEN) 5 MG tablet    Sig: Take 1 tablet (5 mg total) by mouth at bedtime as needed for up to 4 days for sleep.    Dispense:  4 tablet    Refill:  0    Order Specific Question:   Supervising Provider    Answer:   Myna Hidalgo [[id number]]     P: Discharge home in stable condition with early labor precautions  Calvert Cantor, CNM 09/14/2021 6:48 AM

## 2021-09-14 NOTE — MAU Note (Signed)
.  Emily Cantu is a 26 y.o. at [redacted]w[redacted]d here in MAU reporting: ctx every six minutes that have been on-going for a couple of days (10/10). Denies VB or LOF. Reports good FM.  No recent cervical exams.  Onset of complaint: on-going Pain score: 10//10 Vitals:   09/14/21 0134  BP: 113/66  Pulse: 90  Resp: 18  Temp: 98.4 F (36.9 C)  SpO2: 99%     VEL:FYBOFBPZ; pt wearing a dress Lab orders placed from triage:  mau labor

## 2021-09-15 MED ORDER — OXYCODONE HCL 5 MG PO TABS
5.0000 mg | ORAL_TABLET | ORAL | Status: DC | PRN
  Administered 2021-09-15 – 2021-09-16 (×3): 5 mg via ORAL
  Filled 2021-09-15 (×3): qty 1

## 2021-09-15 NOTE — Lactation Note (Signed)
This note was copied from a baby's chart. Lactation Consultation Note  Patient Name: Emily Cantu Today's Date: 09/15/2021 Reason for consult: Initial assessment Age:25 hours  P3, Mother denies questions or concerns. Provided lactation brochure. Encouraged breastfeeding before offering formula to help mother establish her milk supply.  Maternal Data Does the patient have breastfeeding experience prior to this delivery?: Yes How long did the patient breastfeed?: 20 mos. & 3 mos. with last child  Feeding Mother's Current Feeding Choice: Breast Milk and Formula Nipple Type: Slow - flow  LATCH Score  RN view latch Latch: Grasps breast easily, tongue down, lips flanged, rhythmical sucking.  Audible Swallowing: A few with stimulation  Type of Nipple: Everted at rest and after stimulation  Comfort (Breast/Nipple): Soft / non-tender  Hold (Positioning): No assistance needed to correctly position infant at breast.  LATCH Score: 9    Interventions Interventions: Education;LC Services brochure  Discharge    Consult Status Consult Status: PRN    Dahlia Byes [facility] 09/15/2021, 8:40 AM

## 2021-09-15 NOTE — Lactation Note (Signed)
This note was copied from a baby's chart. Lactation Consultation Note Late documentation: Mom originally didn't want to see Lactation d/t experienced BF mom. Then baby fussy so mom changed her mind wanting to see Lactation. No Lactation in house at that time so mom asked for formula d/t BF not satisfying baby. Grandmother gave formula. Baby crying a lot suckling on hands, baby got another bottle still suckling on hands. LC went to see mom explaining supplement small amounts according to hours of age. Mom stated FOB has gone home to get pacifier because baby is just wanting to suck. Mom stated she has been BF her and has had 2 bottles. Encouraged to put to breast w/cues. Lactation will come see mom in am. Recommended mom not to give formula before calling for Lactation. Baby could now be crying d/t to full from formula. Baby is sleeping when LC was in room talking w/mom.  Patient Name: Emily Cantu FUXNA'T Date: 09/15/2021   Age:25 hours  Maternal Data    Feeding Nipple Type: Slow - flow  LATCH Score                    Lactation Tools Discussed/Used Tools: Bottle  Interventions    Discharge    Consult Status      Charyl Dancer 09/15/2021, 12:18 AM

## 2021-09-15 NOTE — Progress Notes (Addendum)
Patient ID: Emily Cantu, female   DOB: Apr 20, 1995, 26 y.o.   MRN: [id number] POSTPARTUM PROGRESS NOTE  Subjective: Emily Cantu is a 26 y.o. [id number] s/p SVD at [redacted]w[redacted]d.  She reports she doing well. No acute events overnight. She denies any problems with ambulating, voiding or po intake. Denies nausea or vomiting. Patient reports intermittent cramping pain. Lochia is appropriate.  Objective: Blood pressure 106/68, pulse 72, temperature 98.2 F (36.8 C), temperature source Oral, resp. rate 18, last menstrual period 12/17/2020, SpO2 99 %, unknown if currently breastfeeding.  Physical Exam:  General: alert, cooperative and no distress Chest: no respiratory distress Abdomen: soft, non-tender  Uterine Fundus: firm and at level of umbilicus Extremities: No calf swelling or tenderness  no edema  Recent Labs    09/14/21 1031  HGB 13.1  HCT 38.3    Assessment/Plan: Emily Cantu is a 26 y.o. [id number] s/p SVD at [redacted]w[redacted]d.  Routine Postpartum Care: Doing well. PRN oxycodone 5mg  for breakthrough cramping pain.  -- Continue routine care, lactation support  -- Contraception: Outpatient IUD -- Feeding: breast/bottle  Dispo: Plan for discharge tomorrow.   , Medical Student Faculty Practice, Center for [facility] 09/15/2021 7:48 AM  Midwife attestation I have seen and examined this patient and agree with above documentation in the student's note.   Post Partum Day 1  Emily Cantu is a 26 y.o. 22 s/p SVD.  Pt denies problems with ambulating, voiding or po intake. Pain is not well controlled with Ibuprofen and Tylenol. Method of Feeding: breast  PE:  Gen: well appearing Heart: reg rate Lungs: normal WOB Fundus firm Ext: soft, no pain, no edema  Assessment: S/p SVD PPD #1 Uncontrolled pain  Plan for discharge: tomorrow Oxycodone prn pain  [id number], CNM 10:08 AM

## 2021-09-15 NOTE — Anesthesia Postprocedure Evaluation (Signed)
Anesthesia Post Note  Patient: Emily Cantu  Procedure(s) Performed: AN AD HOC LABOR EPIDURAL     Patient location during evaluation: Mother Baby Anesthesia Type: Epidural Level of consciousness: awake and alert Pain management: pain level controlled Vital Signs Assessment: post-procedure vital signs reviewed and stable Respiratory status: spontaneous breathing, nonlabored ventilation and respiratory function stable Cardiovascular status: stable Postop Assessment: no headache, no backache and epidural receding Anesthetic complications: no   No notable events documented.  Last Vitals:  Vitals:   09/15/21 0242 09/15/21 0634  BP: 125/66 106/68  Pulse: 60 72  Resp: 18 18  Temp: 37.1 C 36.8 C  SpO2:      Last Pain:  Vitals:   09/15/21 0634  TempSrc: Oral  PainSc:    Pain Goal:                   Ireoluwa Grant

## 2021-09-15 NOTE — Social Work (Signed)
CSW received consult for hx of Depression.  CSW met with MOB to offer support and complete assessment.    CSW noted that MOB received Edinburgh 9.   CSW met with MOB at beside and introduced CSW role. CSW observed MOB lying in bed awake with the infant next to her. MOB presented calm and welcomed CSW visit. CSW inquired how MOB has felt since giving birth. MOB reported that she "feels fine" and shared the delivery went "okay." CSW inquired how MOB felt during the pregnancy. MOB reported, "I was stressed." CSW assessed further. MOB reported that she was stressed about "a little bit of everything" and gave example "stressed about the birth and having another child." Also, her youngest brother passed away in 03-Apr-2022 and she is grieving his loss. MOB reported that she had symptoms of depression and anxiety for a while but was never diagnosed by a mental health provider. MOB reported no history of mental health treatment even relatives have recommended that she a therapist. MOB shared that her oldest brother passed away when she was 32 and she is still grieving his loss. MOB reported that she is now interested in seeing a therapist. CSW provided MOB with a list of therapy options and discussed [facility] services. CSW offered MOB grief therapy resources and encouraged MOB to follow up. CSW discussed PPD and symptoms. CSW recommended MOB complete a self-evaluation during the postpartum time period using the New Mom Checklist from Postpartum Progress and encouraged MOB to contact a medical professional if symptoms are noted at any time. MOB looked over the check list and stated, " I can check off many of these right now." CSW encourage treatment for mental health. MOB stated that she is not interested in medication however she gave CSW permission to notify IBH counselor at the [facility] office for follow up. CSW provided education regarding the baby blues period vs. perinatal mood disorders, discussed treatment. CSW assessed  MOB for safety. MOB denied thoughts of harm to self and others. CSW inquired about MOB supports. MOB identified FOB, FOB's mom and aunt as supports.  MOB reported that she has all items for the infant including a bassinet where the infant will sleep. MOB reported that she receives [facility]. CSW provided review of Sudden Infant Death Syndrome (SIDS) precautions. MOB has chosen [facility] for the infant's follow up care. CSW discussed community resources and offered to make referral. MOB politely declined. CSW assessed MOB for additional needs. MOB reported no further need.   CSW identifies no further need for intervention and no barriers to discharge at this time.   Kathrin Greathouse, MSW, LCSW Women's and Interior Worker  [phone] 09/15/2021  11:47 AM

## 2021-09-16 ENCOUNTER — Encounter: Payer: Medicaid Other | Admitting: Advanced Practice Midwife

## 2021-09-16 ENCOUNTER — Other Ambulatory Visit (HOSPITAL_COMMUNITY): Payer: Self-pay

## 2021-09-16 MED ORDER — ACETAMINOPHEN 500 MG PO TABS
1000.0000 mg | ORAL_TABLET | Freq: Three times a day (TID) | ORAL | 0 refills | Status: DC | PRN
  Filled 2021-09-16: qty 60, 10d supply, fill #0

## 2021-09-16 MED ORDER — IBUPROFEN 600 MG PO TABS
600.0000 mg | ORAL_TABLET | Freq: Four times a day (QID) | ORAL | 0 refills | Status: DC | PRN
  Filled 2021-09-16: qty 40, 10d supply, fill #0

## 2021-09-16 NOTE — Lactation Note (Signed)
This note was copied from a baby's chart. Lactation Consultation Note  Patient Name: Emily Cantu FRTMY'T Date: 09/16/2021 Reason for consult: Follow-up assessment;Early term 37-38.6wks;Infant weight loss;Breastfeeding assistance (3 % weight loss, LC reviewed BF D/C teaching. per mom BF goal is 6 months. LC recommended offering both breast prior to supplementing if baby is content after both breast hold off on the supplementing. Mom aware of the [facility] resources after D/C.) Age:5 hours  Maternal Data    Feeding Mother's Current Feeding Choice: Breast Milk and Formula  LATCH Score Latch: Grasps breast easily, tongue down, lips flanged, rhythmical sucking.  Audible Swallowing: Spontaneous and intermittent  Type of Nipple: Everted at rest and after stimulation  Comfort (Breast/Nipple): Soft / non-tender  Hold (Positioning): Assistance needed to correctly position infant at breast and maintain latch.  LATCH Score: 9   Lactation Tools Discussed/Used    Interventions Interventions: Breast feeding basics reviewed;Assisted with latch;Skin to skin;Adjust position;Support pillows;Position options;Education;LC Services brochure  Discharge Discharge Education: Engorgement and breast care Pump: DEBP;Personal  Consult Status Consult Status: Complete Date: 09/16/21    Kathrin Greathouse 09/16/2021, 12:08 PM

## 2021-09-16 NOTE — Plan of Care (Signed)
Discharge teaching given with after visit summary, pt receptive.

## 2021-09-17 NOTE — BH Specialist Note (Signed)
Integrated Behavioral Health via Telemedicine Visit  10/01/2021 [name] [id number]  Number of Integrated Behavioral Health Clinician visits: 1- Initial Visit  Session Start time: 1500   Session End time: 1525  Total time in minutes: 25   Referring Provider: Jaynie Collins, MD Patient/Family location: Home [facility] Provider location: Center for [facility] at [facility]  All persons participating in visit: Patient Emily Cantu and [facility] Emily Cantu   Types of Service: Individual psychotherapy and Telephone visit  I connected with Emily Cantu and/or Emily Cantu's  n/a  via  Telephone or Video Enabled Telemedicine Application  (Video is Caregility application) and verified that I am speaking with the correct person using two identifiers. Discussed confidentiality: Yes   I discussed the limitations of telemedicine and the availability of in person appointments.  Discussed there is a possibility of technology failure and discussed alternative modes of communication if that failure occurs.  I discussed that engaging in this telemedicine visit, they consent to the provision of behavioral healthcare and the services will be billed under their insurance.  Patient and/or legal guardian expressed understanding and consented to Telemedicine visit: Yes   Presenting Concerns: Patient and/or family reports the following symptoms/concerns: Lack of quality sleep (up to 3 hours at a time last two weeks); good appetite, some support at home; copes with loss of brother this year by "not thinking about it"; no other concern at this time.  Duration of problem: Perinatal; Severity of problem: mild  Patient and/or Family's Strengths/Protective Factors: Social connections, Concrete supports in place (healthy food, safe environments, etc.), Sense of purpose, and Physical Health (exercise, healthy diet, medication compliance, etc.)  Goals Addressed: Patient will:    Demonstrate ability to: Increase healthy adjustment to current life circumstances and Increase adequate support systems for patient/family  Progress towards Goals: Ongoing  Interventions: Interventions utilized:  Psychoeducation and/or Health Education, Link to [employer], and Supportive Reflection Standardized Assessments completed: Not Needed  Patient and/or Family Response: Patient agrees with treatment plan.    Assessment: Patient currently experiencing Other specified counseling, postpartum mood check; Grief.   Patient may benefit from psychoeducation and brief therapeutic interventions regarding coping with adjusting to new baby, while grieving loss .  Plan: Follow up with behavioral health clinician on : Call Emily Cantu at [phone], as needed. Behavioral recommendations:  --Continue prioritizing healthy self-care (regular meals, adequate rest; allowing practical help from supportive friends and family) until at least postpartum medical appointment -Consider new mom support group as needed at either [email or web address].net or [email or web address]   Referral(s): Integrated Art gallery manager (In Clinic) and [employer]:  new mom support  I discussed the assessment and treatment plan with the patient and/or parent/guardian. They were provided an opportunity to ask questions and all were answered. They agreed with the plan and demonstrated an understanding of the instructions.   They were advised to call back or seek an in-person evaluation if the symptoms worsen or if the condition fails to improve as anticipated.  Valetta Close Kavaughn Faucett, LCSW     08/28/2021    1:18 PM 06/25/2021    9:11 AM  Depression screen PHQ 2/9  Decreased Interest 0 2  Down, Depressed, Hopeless 1 1  PHQ - 2 Score 1 3  Altered sleeping 1 1  Tired, decreased energy 1 2  Change in appetite 1 1  Feeling bad or failure about yourself  0 0  Trouble concentrating 1 2  Moving slowly or  fidgety/restless  0 0  Suicidal thoughts 0 0  PHQ-9 Score 5 9      08/28/2021    1:18 PM 06/25/2021    9:12 AM  GAD 7 : Generalized Anxiety Score  Nervous, Anxious, on Edge 2 2  Control/stop worrying 2 1  Worry too much - different things 2 2  Trouble relaxing 1 2  Restless 1 1  Easily annoyed or irritable 2 3  Afraid - awful might happen 1 0  Total GAD 7 Score 11 11      09/15/2021    6:54 AM 06/19/2019    4:49 PM 12/26/2016    6:12 PM  Edinburgh Postnatal Depression Scale Screening Tool  I have been able to laugh and see the funny side of things. 0 0 0  I have looked forward with enjoyment to things. 0 0 0  I have blamed myself unnecessarily when things went wrong. 1 1 1   I have been anxious or worried for no good reason. 2 2 1   I have felt scared or panicky for no good reason. 2 2 0  Things have been getting on top of me. 1 1 0  I have been so unhappy that I have had difficulty sleeping. 2 1 0  I have felt sad or miserable. 1 0 0  I have been so unhappy that I have been crying. 0 0 0  The thought of harming myself has occurred to me. 0 0 0  Edinburgh Postnatal Depression Scale Total 9 7 2

## 2021-09-18 ENCOUNTER — Encounter: Payer: Medicaid Other | Admitting: Family Medicine

## 2021-10-01 ENCOUNTER — Ambulatory Visit (INDEPENDENT_AMBULATORY_CARE_PROVIDER_SITE_OTHER): Payer: Medicaid Other | Admitting: Clinical

## 2021-10-01 DIAGNOSIS — F4321 Adjustment disorder with depressed mood: Secondary | ICD-10-CM

## 2021-10-01 DIAGNOSIS — Z7189 Other specified counseling: Secondary | ICD-10-CM

## 2021-10-01 NOTE — Patient Instructions (Signed)
Center for [facility] at [facility] [address] [place], [place] [address] [phone] (main office) [phone] [facility] office)  New Parent Support Groups [email or web address].net [email or web address]  [facility] (Individual and group grief support) [facility]  [phone]

## 2021-10-23 ENCOUNTER — Ambulatory Visit (INDEPENDENT_AMBULATORY_CARE_PROVIDER_SITE_OTHER): Payer: Medicaid Other | Admitting: Family Medicine

## 2021-10-23 ENCOUNTER — Encounter: Payer: Self-pay | Admitting: Family Medicine

## 2021-10-23 NOTE — Progress Notes (Signed)
Post Partum Visit Note  Emily Cantu is a 26 y.o. [phone] female who presents for a postpartum visit. She is 5 weeks postpartum following a normal spontaneous vaginal delivery.  I have fully reviewed the prenatal and intrapartum course. The delivery was at 38 gestational weeks.  Anesthesia: epidural. Postpartum course has been normal. Baby is doing well. Baby is feeding by breast. Bleeding no bleeding. Bowel function is normal. Bladder function is normal. Patient is not sexually active. Contraception method is none. Postpartum depression screening: negative.   The pregnancy intention screening data noted above was reviewed. Potential methods of contraception were discussed. The patient elected to proceed with No data recorded.   Edinburgh Postnatal Depression Scale - 10/23/21 1312       Edinburgh Postnatal Depression Scale:  In the Past 7 Days   I have looked forward with enjoyment to things. 0    I have blamed myself unnecessarily when things went wrong. 1    I have been anxious or worried for no good reason. 1    I have felt scared or panicky for no good reason. 1    Things have been getting on top of me. 1    I have been so unhappy that I have had difficulty sleeping. 1    I have felt sad or miserable. 1    I have been so unhappy that I have been crying. 1    The thought of harming myself has occurred to me. 0             Health Maintenance Due  Topic Date Due   COVID-19 Vaccine (1) Never done   HPV VACCINES (1 - 2-dose series) Never done   TETANUS/TDAP  Never done   INFLUENZA VACCINE  09/23/2021    The following portions of the patient's history were reviewed and updated as appropriate: allergies, current medications, past family history, past medical history, past social history, past surgical history, and problem list.  Review of Systems Pertinent items are noted in HPI.  Objective:  BP 100/62   Pulse (!) 43   Wt 151 lb (68.5 kg)   LMP 12/17/2020  (Approximate)   Breastfeeding Yes   BMI 24.75 kg/m    General:  alert, cooperative, and no distress   Breasts:  not indicated  Lungs: clear to auscultation bilaterally  Heart:  regular rate and rhythm, S1, S2 normal, no murmur, click, rub or gallop  Abdomen: soft, non-tender; bowel sounds normal; no masses,  no organomegaly   Wound N/a  GU exam:  not indicated       Assessment:    1. Postpartum care and examination   Plan:   Essential components of care per ACOG recommendations:  1.  Mood and well being: Patient with negative depression screening today. Reviewed local resources for support.  - Patient tobacco use? No.   - hx of drug use? No.    2. Infant care and feeding:  -Patient currently breastmilk feeding? Yes. Discussed returning to work and pumping. Reviewed importance of draining breast regularly to support lactation.  -Social determinants of health (SDOH) reviewed in EPIC. No concerns  3. Sexuality, contraception and birth spacing - Patient does not want a pregnancy in the next year. - Reviewed reproductive life planning. Reviewed contraceptive methods based on pt preferences and effectiveness.  Patient desired Female Condom today.   - Discussed birth spacing of 18 months  4. Sleep and fatigue -Encouraged family/partner/community support of 4 hrs of  uninterrupted sleep to help with mood and fatigue  5. Physical Recovery  - Discussed patients delivery and complications. She describes her labor as good. - Patient had a Vaginal, no problems at delivery.  - Patient has urinary incontinence? No. - Patient is safe to resume physical and sexual activity  6.  Health Maintenance - HM due items addressed Yes - Last pap smear  Diagnosis  Date Value Ref Range Status  12/02/2020   Final   - Negative for intraepithelial lesion or malignancy (NILM)   Pap smear not done at today's visit.  -Breast Cancer screening indicated? No.   7. Chronic Disease/Pregnancy Condition  follow up: None  - PCP follow up  Levie Heritage, DO Center for [facility], [facility]

## 2021-10-30 ENCOUNTER — Telehealth: Payer: Self-pay | Admitting: General Practice

## 2021-10-30 ENCOUNTER — Inpatient Hospital Stay (HOSPITAL_COMMUNITY)
Admission: AD | Admit: 2021-10-30 | Discharge: 2021-10-30 | Disposition: A | Discharge status: Home / Self Care | Payer: Medicaid Other | Attending: Obstetrics and Gynecology | Admitting: Obstetrics and Gynecology

## 2021-10-30 DIAGNOSIS — R3915 Urgency of urination: Secondary | ICD-10-CM | POA: Insufficient documentation

## 2021-10-30 DIAGNOSIS — R102 Pelvic and perineal pain: Secondary | ICD-10-CM | POA: Diagnosis present

## 2021-10-30 DIAGNOSIS — N3000 Acute cystitis without hematuria: Secondary | ICD-10-CM | POA: Diagnosis not present

## 2021-10-30 MED ORDER — NITROFURANTOIN MONOHYD MACRO 100 MG PO CAPS: 100.0000 mg | ORAL_CAPSULE | Freq: Two times a day (BID) | ORAL | 0 refills | Status: DC

## 2021-10-30 NOTE — MAU Provider Note (Signed)
History     [id number]  Arrival date and time: 10/30/21 5329    Chief Complaint  Patient presents with   Pelvic Pain   Urinary Urgency     HPI Emily Cantu is a 26 y.o. s/p NSVD on 09/14/21, who presents for pelvic pain.   Patient reports that since yesterday has had a sharp intermittent pain/pressure in her LLQ  Associated with urge to urinate but when she goes she is unable to a lot of the time Does not think she has burning or pain when she does urinate No vaginal bleeding, stopped after delivery and has already had a period come and go No nausea or vomiting No fevers No back pain No vaginal discharge No constipation, has had some intermittent diarrhea actually Called and spoke with [facility] office earlier, is scheduled for a visit tomorrow morning   --/--/O POS (07/23 1058)  OB History     Gravida  5   Para  3   Term  3   Preterm      AB  1   Living  3      SAB      IAB  1   Ectopic      Multiple  0   Live Births  3           Past Medical History:  Diagnosis Date   Abortion    Back pain affecting pregnancy    Chlamydia infection affecting pregnancy in third trimester 06/18/2019   Depression    Scoliosis    Subchorionic hematoma    Subchorionic hematoma in first trimester 11/01/2018   Bleeding precations Yolk sac seen at 5 wks Recheck [place] in 2-3 weeks   Vaginal delivery 12/24/2016    Past Surgical History:  Procedure Laterality Date   NO PAST SURGERIES     THERAPEUTIC ABORTION  05/28/2018    Family History  Problem Relation Age of Onset   Hypertension Mother    Cancer Father    Hypertension Maternal Grandmother     Social History   Socioeconomic History   Marital status: Single    Spouse name: Not on file   Number of children: 1   Years of education: Not on file   Highest education level: Not on file  Occupational History   Not on file  Tobacco Use   Smoking status: Never   Smokeless tobacco: Never  Vaping  Use   Vaping Use: Never used  Substance and Sexual Activity   Alcohol use: No   Drug use: No   Sexual activity: Yes    Birth control/protection: None  Other Topics Concern   Not on file  Social History Narrative   Not on file   Social Determinants of Health   Financial Resource Strain: Not on file  Food Insecurity: Not on file  Transportation Needs: Not on file  Physical Activity: Not on file  Stress: Not on file  Social Connections: Not on file  Intimate Partner Violence: Not on file    No Known Allergies  No current facility-administered medications on file prior to encounter.   Current Outpatient Medications on File Prior to Encounter  Medication Sig Dispense Refill   [DISCONTINUED] ferrous sulfate (FERROUSUL) 325 (65 FE) MG tablet Take 1 tablet (325 mg total) by mouth 2 (two) times daily. 60 tablet 1   [DISCONTINUED] misoprostol (CYTOTEC) 200 MCG tablet Take 1 tablet (200 mcg total) by mouth 3 (three) times daily. 9 tablet 0  ROS Pertinent positives and negative per HPI, all others reviewed and negative  Physical Exam   BP 110/72 (BP Location: Right Arm)   Pulse (!) 54   Temp 98.2 F (36.8 C) (Oral)   Resp 16   Ht 5' 5.5" (1.664 m)   Wt 66.9 kg   LMP 10/18/2021   SpO2 99%   Breastfeeding Yes Comment: baby is doing good  BMI 24.16 kg/m   Patient Vitals for the past 24 hrs:  BP Temp Temp src Pulse Resp SpO2 Height Weight  10/30/21 0916 110/72 98.2 F (36.8 C) Oral (!) 54 16 99 % 5' 5.5" (1.664 m) 66.9 kg    Physical Exam Vitals reviewed.  Constitutional:      General: She is not in acute distress.    Appearance: She is well-developed. She is not diaphoretic.  Eyes:     General: No scleral icterus. Pulmonary:     Effort: Pulmonary effort is normal. No respiratory distress.  Abdominal:     General: There is no distension.     Palpations: Abdomen is soft.     Tenderness: There is abdominal tenderness. There is no guarding or rebound.      Comments: Very mild suprapubic tenderness  Skin:    General: Skin is warm and dry.  Neurological:     Mental Status: She is alert.     Coordination: Coordination normal.      Cervical Exam    Bedside Ultrasound Not done  My interpretation: n/a  FHT N/a  Labs No results found for this or any previous visit (from the past 24 hour(s)).  Imaging No results found.  MAU Course  Procedures Lab Orders         Urine Culture     Meds ordered this encounter  Medications   nitrofurantoin, macrocrystal-monohydrate, (MACROBID) 100 MG capsule    Sig: Take 1 capsule (100 mg total) by mouth 2 (two) times daily.    Dispense:  14 capsule    Refill:  0   Imaging Orders  No imaging studies ordered today    MDM mild  Assessment and Plan  #UTI Suspect UTI based on symptoms, will treat empirically with macrobid. Has follow up visit tomorrow at [facility] office. Urine sent for culture. Discussed warning signs for which she should present to main ED (given she is already >6 weeks postpartum) including fever, back pain, severe abdominal pain, blood in stool or urine, n/v such that she is unable to maintain PO intake.   Dispo: discharged to home in stable condition.    Venora Maples, MD/MPH 10/30/21 10:29 AM  Allergies as of 10/30/2021   No Known Allergies      Medication List     TAKE these medications    nitrofurantoin (macrocrystal-monohydrate) 100 MG capsule Commonly known as: MACROBID Take 1 capsule (100 mg total) by mouth 2 (two) times daily.

## 2021-10-30 NOTE — Telephone Encounter (Signed)
Pt called c/o of severe vaginal pain.  Consulted with CMA and recommended pt to go to MAU to be evaluated d/t her being 6 weeks postpartum.  Pt is to follow up in the office tomorrow, 10/31/2021 at 0915 if needed.  Pt verbalized understanding.

## 2021-10-30 NOTE — MAU Note (Signed)
Emily Cantu is a 26 y.o. here in MAU reporting: having pelvic pain, "inside"..  has the urge to pee, but when she goes - nothing comes out.  Lot of pressure.  Vag delivery 7/23. Has not resumed intercourse LMP: 8/26 Onset of complaint: early this morning Pain score: 9 Vitals:   10/30/21 0916  BP: 110/72  Pulse: (!) 54  Resp: 16  Temp: 98.2 F (36.8 C)  SpO2: 99%     Lab orders placed from triage:

## 2021-10-30 NOTE — Discharge Instructions (Signed)
Follow up at [facility] for your scheduled appointment tomorrow to make sure your symptoms are improving.

## 2021-10-31 ENCOUNTER — Other Ambulatory Visit: Payer: Self-pay

## 2021-10-31 ENCOUNTER — Encounter (HOSPITAL_BASED_OUTPATIENT_CLINIC_OR_DEPARTMENT_OTHER): Payer: Self-pay

## 2021-10-31 ENCOUNTER — Ambulatory Visit (INDEPENDENT_AMBULATORY_CARE_PROVIDER_SITE_OTHER): Payer: Medicaid Other | Admitting: Obstetrics and Gynecology

## 2021-10-31 ENCOUNTER — Emergency Department (HOSPITAL_BASED_OUTPATIENT_CLINIC_OR_DEPARTMENT_OTHER)
Admission: EM | Admit: 2021-10-31 | Discharge: 2021-10-31 | Disposition: A | Discharge status: Home / Self Care | Payer: Medicaid Other | Attending: Emergency Medicine | Admitting: Emergency Medicine

## 2021-10-31 ENCOUNTER — Encounter: Payer: Self-pay | Admitting: Obstetrics and Gynecology

## 2021-10-31 VITALS — BP 109/57 | HR 64 | Wt 150.0 lb

## 2021-10-31 DIAGNOSIS — M62838 Other muscle spasm: Secondary | ICD-10-CM | POA: Diagnosis not present

## 2021-10-31 DIAGNOSIS — R21 Rash and other nonspecific skin eruption: Secondary | ICD-10-CM | POA: Insufficient documentation

## 2021-10-31 DIAGNOSIS — R600 Localized edema: Secondary | ICD-10-CM | POA: Diagnosis not present

## 2021-10-31 DIAGNOSIS — T7840XA Allergy, unspecified, initial encounter: Secondary | ICD-10-CM

## 2021-10-31 LAB — URINALYSIS, ROUTINE W REFLEX MICROSCOPIC
Bilirubin Urine: NEGATIVE
Glucose, UA: NEGATIVE mg/dL
Ketones, ur: NEGATIVE mg/dL
Leukocytes,Ua: NEGATIVE
Nitrite: NEGATIVE
Protein, ur: NEGATIVE mg/dL
Specific Gravity, Urine: 1.007 (ref 1.005–1.030)
pH: 6 (ref 5.0–8.0)

## 2021-10-31 MED ORDER — LORATADINE 10 MG PO TABS
10.0000 mg | ORAL_TABLET | Freq: Once | ORAL | Status: AC
  Administered 2021-10-31: 10 mg via ORAL
  Filled 2021-10-31: qty 1

## 2021-10-31 MED ORDER — PREDNISONE 10 MG (21) PO TBPK: ORAL_TABLET | ORAL | 0 refills | Status: AC

## 2021-10-31 MED ORDER — CYCLOBENZAPRINE HCL 5 MG PO TABS: 5.0000 mg | ORAL_TABLET | Freq: Three times a day (TID) | ORAL | 0 refills | Status: AC | PRN

## 2021-10-31 MED ORDER — PREDNISONE 50 MG PO TABS
60.0000 mg | ORAL_TABLET | Freq: Once | ORAL | Status: AC
  Administered 2021-10-31: 60 mg via ORAL
  Filled 2021-10-31: qty 1

## 2021-10-31 NOTE — Progress Notes (Signed)
GYNECOLOGY OFFICE VISIT NOTE  History:   Emily Cantu is a 26 y.o. [id number] here today for vaginal discomfort.   Patient went to MAU on 10-30-2021 because of vaginal pain/ pinching feeling in her uterus. They treated her for UTI with Macrobid.  Patient went this morning for allergic reaction at the ED at [facility] for rash. Patient instructed to stop Macrobid and start prednisone pack.  Patient no longer having the pain in her uterus.   Patient is breastfeeding and bottle feeding. Patient is 5 weeks postpartum.    Past Medical History:  Diagnosis Date   Abortion    Back pain affecting pregnancy    Chlamydia infection affecting pregnancy in third trimester 06/18/2019   Depression    Scoliosis    Subchorionic hematoma    Subchorionic hematoma in first trimester 11/01/2018   Bleeding precations Yolk sac seen at 5 wks Recheck [place] in 2-3 weeks   Vaginal delivery 12/24/2016    Past Surgical History:  Procedure Laterality Date   NO PAST SURGERIES     THERAPEUTIC ABORTION  05/28/2018    The following portions of the patient's history were reviewed and updated as appropriate: allergies, current medications, past family history, past medical history, past social history, past surgical history and problem list.   Health Maintenance:   Diagnosis  Date Value Ref Range Status  12/02/2020   Final   - Negative for intraepithelial lesion or malignancy (NILM)    Review of Systems:  Pertinent items noted in HPI and remainder of comprehensive ROS otherwise negative.  Physical Exam:  BP (!) 109/57   Pulse 64   Wt 150 lb (68 kg)   LMP 10/18/2021   BMI 24.58 kg/m  CONSTITUTIONAL: Well-developed, well-nourished female in no acute distress.  HEENT:  Normocephalic, atraumatic. External right and left ear normal. No scleral icterus.  NECK: Normal range of motion, supple, no masses noted on observation SKIN: No rash noted. Not diaphoretic. No erythema. No pallor. MUSCULOSKELETAL:  Normal range of motion. No edema noted. NEUROLOGIC: Alert and oriented to person, place, and time. Normal muscle tone coordination. No cranial nerve deficit noted. PSYCHIATRIC: Normal mood and affect. Normal behavior. Normal judgment and thought content.  CARDIOVASCULAR: Normal heart rate noted RESPIRATORY: Effort and breath sounds normal, no problems with respiration noted ABDOMEN: No masses noted. No other overt distention noted.    PELVIC: Normal appearing external genitalia; normal urethral meatus; normal appearing vaginal mucosa.  No abnormal discharge noted.  Normal uterine size, no other palpable masses, no uterine or adnexal tenderness. Bladder was nontender. Left levator muscle was tender and recreated her pain that she had felt before. Performed in the presence of a chaperone  Labs and Imaging Results for orders placed or performed during the hospital encounter of 10/31/21 (from the past 168 hour(s))  Urinalysis, Routine w reflex microscopic   Collection Time: 10/31/21  4:51 AM  Result Value Ref Range   Color, Urine YELLOW YELLOW   APPearance CLEAR CLEAR   Specific Gravity, Urine 1.007 1.005 - 1.030   pH 6.0 5.0 - 8.0   Glucose, UA NEGATIVE NEGATIVE mg/dL   Hgb urine dipstick SMALL (A) NEGATIVE   Bilirubin Urine NEGATIVE NEGATIVE   Ketones, ur NEGATIVE NEGATIVE mg/dL   Protein, ur NEGATIVE NEGATIVE mg/dL   Nitrite NEGATIVE NEGATIVE   Leukocytes,Ua NEGATIVE NEGATIVE   RBC / HPF 6-10 0 - 5 RBC/hpf   WBC, UA 6-10 0 - 5 WBC/hpf   Squamous Epithelial / LPF  0-5 0 - 5   No results found.  Assessment and Plan:   1. Levator spasm Will do muscle relaxant. Confirmed safe with breastfeeding. Reviewed if still having the pain after one month would recommend PFPT. - cyclobenzaprine (FLEXERIL) 5 MG tablet; Take 1 tablet (5 mg total) by mouth 3 (three) times daily as needed for muscle spasms.  Dispense: 30 tablet; Refill: 0   Routine preventative health maintenance measures  emphasized. Please refer to After Visit Summary for other counseling recommendations.   No follow-ups on file.  Milas Hock, MD, FACOG Obstetrician & Gynecologist, [facility], [facility]

## 2021-10-31 NOTE — ED Notes (Signed)
Swelling to lips improved.

## 2021-10-31 NOTE — ED Triage Notes (Signed)
Suspected allergic reaction to Macrobid.   First dose yesterday after taking meds at 11am, and then 4pm.  ~6PM began noticing generalized itching, lip swelling, and hoarseness.

## 2021-10-31 NOTE — ED Provider Notes (Signed)
[facility] EMERGENCY DEPT  Provider Note  CSN: [id number] Arrival date & time: 10/31/21 0429  History Chief Complaint  Patient presents with   Allergic Reaction    Emily Cantu is a 26 y.o. female who is approx 6wks post-partum, currently breastfeeding, has had a few days of pelvic discomfort and dysuria. Seen at MAU yesterday morning and started on Macrobid for presumed UTI, she reports a urine was collected but no results available in Epic. She reports a couple of hours after her second dose, she began to have some itching on arms and trunk. She woke up during the night with face itching and lower lip swelling. No difficulty breathing or swallowing.    Home Medications Prior to Admission medications   Medication Sig Start Date End Date Taking? Authorizing Provider  predniSONE (STERAPRED UNI-PAK 21 TAB) 10 MG (21) TBPK tablet 10mg  Tabs, 6 day taper. Use as directed 10/31/21  Yes 12/31/21, MD  ferrous sulfate (FERROUSUL) 325 (65 FE) MG tablet Take 1 tablet (325 mg total) by mouth 2 (two) times daily. 07/28/18 07/13/19  07/15/19, DO  misoprostol (CYTOTEC) 200 MCG tablet Take 1 tablet (200 mcg total) by mouth 3 (three) times daily. 07/17/18 07/13/19  07/15/19, CNM     Allergies    Macrobid [nitrofurantoin]   Review of Systems   Review of Systems Please see HPI for pertinent positives and negatives  Physical Exam BP 124/73   Pulse (!) 58   Temp 98.4 F (36.9 C)   Resp 18   Ht 5' 5.5" (1.664 m)   Wt 66.7 kg   LMP 10/18/2021 (Exact Date)   SpO2 100%   BMI 24.09 kg/m   Physical Exam Vitals and nursing note reviewed.  Constitutional:      Appearance: Normal appearance.  HENT:     Head: Normocephalic and atraumatic.     Nose: Nose normal.     Mouth/Throat:     Mouth: Mucous membranes are moist.     Pharynx: No posterior oropharyngeal erythema.     Comments: Mild swelling of lower lip Eyes:     Extraocular Movements: Extraocular  movements intact.     Conjunctiva/sclera: Conjunctivae normal.  Cardiovascular:     Rate and Rhythm: Normal rate.  Pulmonary:     Effort: Pulmonary effort is normal.     Breath sounds: Normal breath sounds. No stridor.  Abdominal:     General: Abdomen is flat.     Palpations: Abdomen is soft.     Tenderness: There is no abdominal tenderness.  Musculoskeletal:        General: No swelling. Normal range of motion.     Cervical back: Neck supple.  Skin:    General: Skin is warm and dry.     Findings: Rash (occasional erythematous rash to arms, and trunk) present.  Neurological:     General: No focal deficit present.     Mental Status: She is alert.  Psychiatric:        Mood and Affect: Mood normal.     ED Results / Procedures / Treatments   EKG None  Procedures Procedures  Medications Ordered in the ED Medications  predniSONE (DELTASONE) tablet 60 mg (60 mg Oral Given 10/31/21 0502)  loratadine (CLARITIN) tablet 10 mg (10 mg Oral Given 10/31/21 0502)    Initial Impression and Plan  Patient here with likely allergic reaction to Macrobid started yesterday empirically for UTI. Will check urine here as sample she  gave at MAU yesterday morning does not seem to have been sent to the lab. She reports her urinary symptoms are improved. Will begin prednisone. Avoid benadryl as she is breastfeeding, will use loratadine instead.   ED Course   Clinical Course as of 10/31/21 0526  Fri Oct 31, 2021  0504 UA without signs of UTI.  [CS]  0522 Patient has an appointment with her Gyn later this morning for a post-partum visit. Advised to stop the macrobid and discuss any other needed treatments for her previous pelvic symptoms with them. Rx for pred-pak, OTC loratadine. RTED for any other concerns.  [CS]    Clinical Course User Index [CS] Pollyann Savoy, MD     MDM Rules/Calculators/A&P Medical Decision Making Problems Addressed: Allergic reaction to drug, initial encounter: acute  illness or injury  Amount and/or Complexity of Data Reviewed Labs: ordered. Decision-making details documented in ED Course.  Risk OTC drugs. Prescription drug management.    Final Clinical Impression(s) / ED Diagnoses Final diagnoses:  Allergic reaction to drug, initial encounter    Rx / DC Orders ED Discharge Orders          Ordered    predniSONE (STERAPRED UNI-PAK 21 TAB) 10 MG (21) TBPK tablet        10/31/21 0525             Pollyann Savoy, MD 10/31/21 [phone]

## 2021-11-01 LAB — URINE CULTURE

## 2022-01-20 ENCOUNTER — Other Ambulatory Visit (HOSPITAL_COMMUNITY)
Admission: RE | Admit: 2022-01-20 | Discharge: 2022-01-20 | Disposition: A | Discharge status: Home / Self Care | Payer: Medicaid Other | Source: Ambulatory Visit | Attending: Advanced Practice Midwife | Admitting: Advanced Practice Midwife

## 2022-01-20 ENCOUNTER — Ambulatory Visit (INDEPENDENT_AMBULATORY_CARE_PROVIDER_SITE_OTHER): Payer: Medicaid Other

## 2022-01-20 VITALS — BP 112/60 | HR 88 | Wt 149.0 lb

## 2022-01-20 DIAGNOSIS — N898 Other specified noninflammatory disorders of vagina: Secondary | ICD-10-CM | POA: Insufficient documentation

## 2022-01-20 NOTE — Progress Notes (Signed)
SUBJECTIVE:  26 y.o. female complains of white vaginal discharge, thick discharge for 2 day(s). Denies abnormal vaginal bleeding or significant pelvic pain or fever. Patient states she did have some burning this morning with urination. Denies history of known exposure to STD.  Patient's last menstrual period was 12/17/2021 (approximate).  OBJECTIVE:  She appears well, afebrile.   ASSESSMENT:  Vaginal Discharge    PLAN:  GC, chlamydia, trichomonas, BVAG, CVAG probe sent to lab. Patient declines blood testing. Treatment: To be determined once lab results are received ROV prn if symptoms persist or worsen.

## 2022-01-21 LAB — CERVICOVAGINAL ANCILLARY ONLY
Bacterial Vaginitis (gardnerella): NEGATIVE
Candida Glabrata: NEGATIVE
Candida Vaginitis: NEGATIVE
Chlamydia: NEGATIVE
Comment: NEGATIVE
Comment: NEGATIVE
Comment: NEGATIVE
Comment: NEGATIVE
Comment: NEGATIVE
Comment: NORMAL
Neisseria Gonorrhea: NEGATIVE
Trichomonas: NEGATIVE

## 2022-05-03 ENCOUNTER — Emergency Department (HOSPITAL_BASED_OUTPATIENT_CLINIC_OR_DEPARTMENT_OTHER)
Admission: EM | Admit: 2022-05-03 | Discharge: 2022-05-04 | Disposition: A | Discharge status: Home / Self Care | Payer: Medicaid Other | Attending: Emergency Medicine | Admitting: Emergency Medicine

## 2022-05-03 ENCOUNTER — Emergency Department (HOSPITAL_BASED_OUTPATIENT_CLINIC_OR_DEPARTMENT_OTHER): Payer: Medicaid Other | Admitting: Radiology

## 2022-05-03 ENCOUNTER — Encounter (HOSPITAL_BASED_OUTPATIENT_CLINIC_OR_DEPARTMENT_OTHER): Payer: Self-pay | Admitting: Emergency Medicine

## 2022-05-03 ENCOUNTER — Other Ambulatory Visit: Payer: Self-pay

## 2022-05-03 DIAGNOSIS — R0602 Shortness of breath: Secondary | ICD-10-CM | POA: Insufficient documentation

## 2022-05-03 DIAGNOSIS — R002 Palpitations: Secondary | ICD-10-CM | POA: Insufficient documentation

## 2022-05-03 DIAGNOSIS — D709 Neutropenia, unspecified: Secondary | ICD-10-CM | POA: Insufficient documentation

## 2022-05-03 DIAGNOSIS — M7989 Other specified soft tissue disorders: Secondary | ICD-10-CM | POA: Diagnosis present

## 2022-05-03 DIAGNOSIS — R609 Edema, unspecified: Secondary | ICD-10-CM

## 2022-05-03 DIAGNOSIS — R051 Acute cough: Secondary | ICD-10-CM | POA: Diagnosis not present

## 2022-05-03 DIAGNOSIS — R600 Localized edema: Secondary | ICD-10-CM | POA: Insufficient documentation

## 2022-05-03 LAB — CBC
HCT: 42 % (ref 36.0–46.0)
Hemoglobin: 14.3 g/dL (ref 12.0–15.0)
MCH: 29.8 pg (ref 26.0–34.0)
MCHC: 34 g/dL (ref 30.0–36.0)
MCV: 87.5 fL (ref 80.0–100.0)
Platelets: 249 10*3/uL (ref 150–400)
RBC: 4.8 MIL/uL (ref 3.87–5.11)
RDW: 13.1 % (ref 11.5–15.5)
WBC: 2.8 10*3/uL — ABNORMAL LOW (ref 4.0–10.5)
nRBC: 0 % (ref 0.0–0.2)

## 2022-05-03 NOTE — ED Provider Notes (Signed)
[place]  Provider Note  CSN: [id number] Arrival date & time: 05/03/22 2258  History Chief Complaint  Patient presents with   Leg Swelling   Cough    Emily Cantu is a 27 y.o. female with no significant PMH reports she has had mild swelling in LLE for about 2 years. She has not had this evaluated but over the last few days has noticed some DOE, cough and SOB with palpitations. She went to her PCP 2 days ago and was prescribed lasix '10mg'$  and escitalopram (for anxiety). She reports no change in symptoms so she came to the ED for evaluation. She has not had any fevers, no N/V/D.    Home Medications Prior to Admission medications   Medication Sig Start Date End Date Taking? Authorizing Provider  cyclobenzaprine (FLEXERIL) 5 MG tablet Take 1 tablet (5 mg total) by mouth 3 (three) times daily as needed for muscle spasms. 10/31/21   Radene Gunning, MD  predniSONE (STERAPRED UNI-PAK 21 TAB) 10 MG (21) TBPK tablet '10mg'$  Tabs, 6 day taper. Use as directed 10/31/21   Truddie Hidden, MD  ferrous sulfate (FERROUSUL) 325 (65 FE) MG tablet Take 1 tablet (325 mg total) by mouth 2 (two) times daily. 07/28/18 07/13/19  Truett Mainland, DO  misoprostol (CYTOTEC) 200 MCG tablet Take 1 tablet (200 mcg total) by mouth 3 (three) times daily. 07/17/18 07/13/19  Laury Deep, CNM     Allergies    Macrobid [nitrofurantoin]   Review of Systems   Review of Systems Please see HPI for pertinent positives and negatives  Physical Exam BP 131/87   Pulse 83   Temp 97.8 F (36.6 C)   Resp 18   Wt 60.3 kg   LMP 05/01/2022   SpO2 100%   BMI 21.80 kg/m   Physical Exam Vitals and nursing note reviewed.  Constitutional:      Appearance: Normal appearance.  HENT:     Head: Normocephalic and atraumatic.     Nose: Nose normal.     Mouth/Throat:     Mouth: Mucous membranes are moist.  Eyes:     Extraocular Movements: Extraocular movements intact.      Conjunctiva/sclera: Conjunctivae normal.  Cardiovascular:     Rate and Rhythm: Normal rate.  Pulmonary:     Effort: Pulmonary effort is normal.     Breath sounds: Normal breath sounds.  Abdominal:     General: Abdomen is flat.     Palpations: Abdomen is soft.     Tenderness: There is no abdominal tenderness.  Musculoskeletal:        General: No swelling. Normal range of motion.     Cervical back: Neck supple.     Right lower leg: Edema (trace to ankles) present.     Left lower leg: Edema (trace to ankles) present.  Skin:    General: Skin is warm and dry.  Neurological:     General: No focal deficit present.     Mental Status: She is alert.  Psychiatric:        Mood and Affect: Mood normal.     ED Results / Procedures / Treatments   EKG EKG Interpretation  Date/Time:  Sunday May 03 2022 23:08:59 EDT Ventricular Rate:  71 PR Interval:  166 QRS Duration: 94 QT Interval:  410 QTC Calculation: 445 R Axis:   55 Text Interpretation: Normal sinus rhythm Normal ECG When compared with ECG of 29-Feb-2020 11:11, T wave inversion  no longer evident in Inferior leads Nonspecific T wave abnormality no longer evident in Lateral leads Rate slower Confirmed by Calvert Cantor [phone]) on 05/03/2022 11:18:54 PM  Procedures Procedures  Medications Ordered in the ED Medications - No data to display  Initial Impression and Plan  Patient here with very mild LE edema ongoing for 2 years now with some respiratory symptoms. Her exam and vitals are reassuring. Low concern for acute CHF, liver failure or renal failure but will check labs to ensure no emergent cause. No concern for DVT given duration and lack of concerning exam findings.   ED Course   Clinical Course as of 05/04/22 0145  Nancy Fetter May 03, 2022  2356 CBC with leukopenia, will add differential.  [CS]  Mon May 04, 2022  0006 I personally viewed the images from radiology studies and agree with radiologist interpretation: CXR is clear   [CS]  0025 CMP is normal. [CS]  [id number] UA without protein.  [CS]  0033 HCG is neg.  [CS]  [id number] Trop is normal.  [CS]  0041 BNP is normal.  [CS]  0132 Differential with moderate neutropenia. Could be due to viral illness with recent cough, but not febrile and no other signs of significant infection/sepsis. She otherwise looks well, plan discharge with instructions to follow up with PCP next week for lab recheck.  [CS]    Clinical Course User Index [CS] Truddie Hidden, MD     MDM Rules/Calculators/A&P Medical Decision Making Problems Addressed: Acute cough: acute illness or injury Neutropenia, unspecified type [facility]): acute illness or injury Peripheral edema: chronic illness or injury  Amount and/or Complexity of Data Reviewed Labs: ordered. Decision-making details documented in ED Course. Radiology: ordered and independent interpretation performed. Decision-making details documented in ED Course. ECG/medicine tests: ordered and independent interpretation performed. Decision-making details documented in ED Course.     Final Clinical Impression(s) / ED Diagnoses Final diagnoses:  Acute cough  Peripheral edema  Neutropenia, unspecified type [facility])    Rx / DC Orders ED Discharge Orders     None        Truddie Hidden, MD 05/04/22 0145

## 2022-05-03 NOTE — ED Triage Notes (Signed)
Pt in today with bilateral leg swelling and cough. States she went to see Dr. Redmond Baseman at [facility] on 3/8 for a wellness visit and was placed on Furosemide, but does not know why. Reports some "tightening" cp and sob also.

## 2022-05-04 LAB — URINALYSIS, ROUTINE W REFLEX MICROSCOPIC
Bacteria, UA: NONE SEEN
Bilirubin Urine: NEGATIVE
Glucose, UA: NEGATIVE mg/dL
Ketones, ur: NEGATIVE mg/dL
Leukocytes,Ua: NEGATIVE
Nitrite: NEGATIVE
Protein, ur: NEGATIVE mg/dL
Specific Gravity, Urine: 1.011 (ref 1.005–1.030)
pH: 6 (ref 5.0–8.0)

## 2022-05-04 LAB — BRAIN NATRIURETIC PEPTIDE: B Natriuretic Peptide: 11.2 pg/mL (ref 0.0–100.0)

## 2022-05-04 LAB — DIFFERENTIAL
Abs Immature Granulocytes: 0 10*3/uL (ref 0.00–0.07)
Basophils Absolute: 0 10*3/uL (ref 0.0–0.1)
Basophils Relative: 1 %
Eosinophils Absolute: 0.1 10*3/uL (ref 0.0–0.5)
Eosinophils Relative: 3 %
Immature Granulocytes: 0 %
Lymphocytes Relative: 47 %
Lymphs Abs: 1.2 10*3/uL (ref 0.7–4.0)
Monocytes Absolute: 0.7 10*3/uL (ref 0.1–1.0)
Monocytes Relative: 27 %
Neutro Abs: 0.6 10*3/uL — ABNORMAL LOW (ref 1.7–7.7)
Neutrophils Relative %: 22 %

## 2022-05-04 LAB — COMPREHENSIVE METABOLIC PANEL
ALT: 15 U/L (ref 0–44)
AST: 23 U/L (ref 15–41)
Albumin: 4.3 g/dL (ref 3.5–5.0)
Alkaline Phosphatase: 55 U/L (ref 38–126)
Anion gap: 10 (ref 5–15)
BUN: 11 mg/dL (ref 6–20)
CO2: 25 mmol/L (ref 22–32)
Calcium: 9.7 mg/dL (ref 8.9–10.3)
Chloride: 103 mmol/L (ref 98–111)
Creatinine, Ser: 0.76 mg/dL (ref 0.44–1.00)
GFR, Estimated: >60 mL/min (ref >60)
Glucose, Bld: 90 mg/dL (ref 70–99)
Potassium: 3.5 mmol/L (ref 3.5–5.1)
Sodium: 138 mmol/L (ref 135–145)
Total Bilirubin: 0.4 mg/dL (ref 0.3–1.2)
Total Protein: 7.8 g/dL (ref 6.5–8.1)

## 2022-05-04 LAB — PREGNANCY, URINE: Preg Test, Ur: NEGATIVE

## 2022-05-04 LAB — TROPONIN I (HIGH SENSITIVITY): Troponin I (High Sensitivity): 7 ng/L (ref <18)

## 2022-05-04 NOTE — ED Notes (Signed)
Reviewed AVS with patient, patient expressed understanding of directions, denies further questions at this time. 

## 2022-05-05 LAB — PATHOLOGIST SMEAR REVIEW

## 2022-06-28 IMAGING — US US OB < 14 WEEKS - US OB TV
1 series · 15 of 28 positions shown · non-contrast
Comparison: 10/17/2020.

CLINICAL DATA: Pain.

EXAM:
OBSTETRIC <14 WK US AND TRANSVAGINAL OB US
TECHNIQUE: Both transabdominal and transvaginal ultrasound examinations were
performed for complete evaluation of the gestation as well as the
maternal uterus, adnexal regions, and pelvic cul-de-sac.
Transvaginal technique was performed to assess early pregnancy.

[Series 1: us ob < 14 weeks - us ob tv · 15 of 74 slices shown]
[im 1/74]
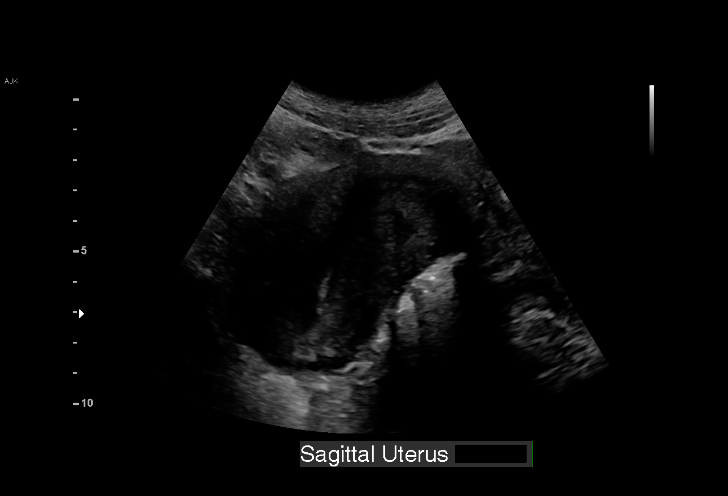
[im 6/74]
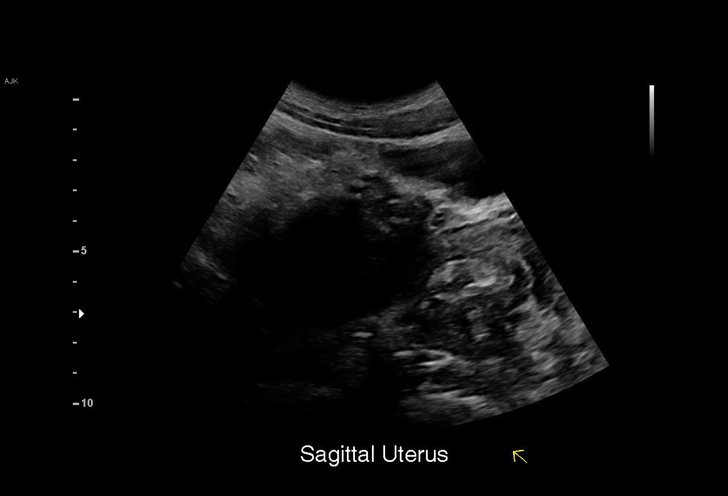
[im 11/74]
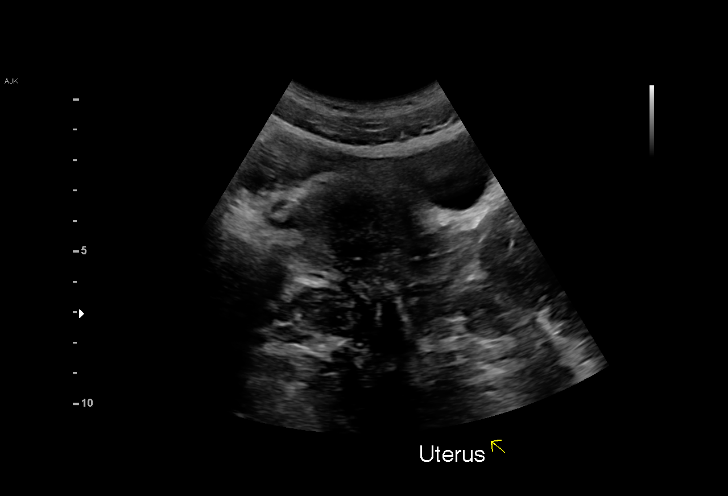
[im 17/74]
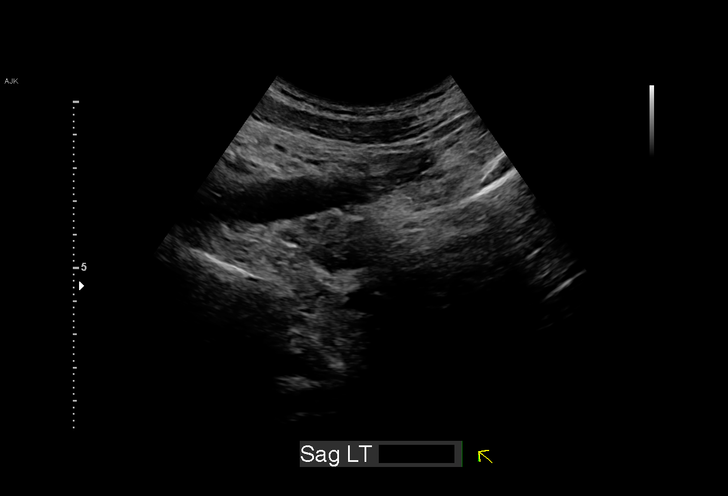
[im 22/74]
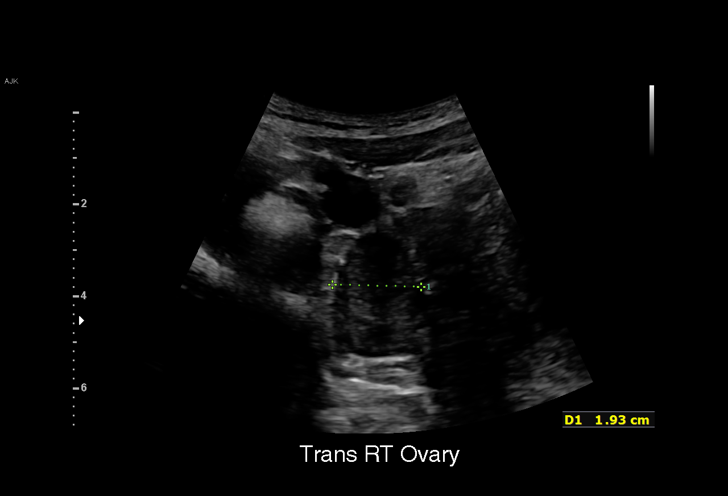
[im 28/74]
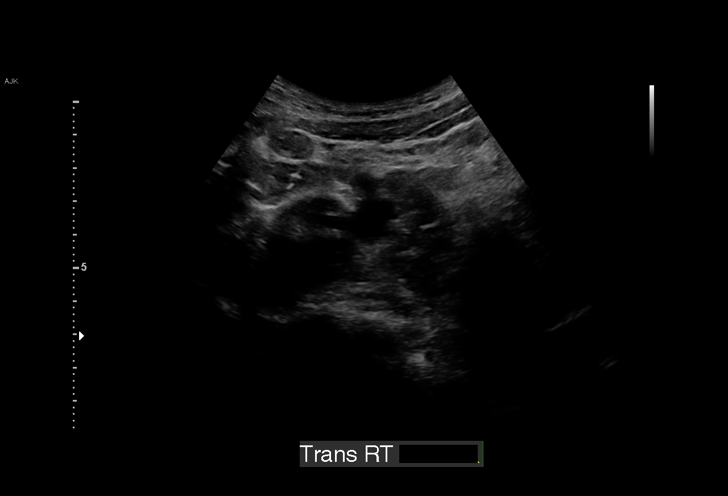
[im 33/74]
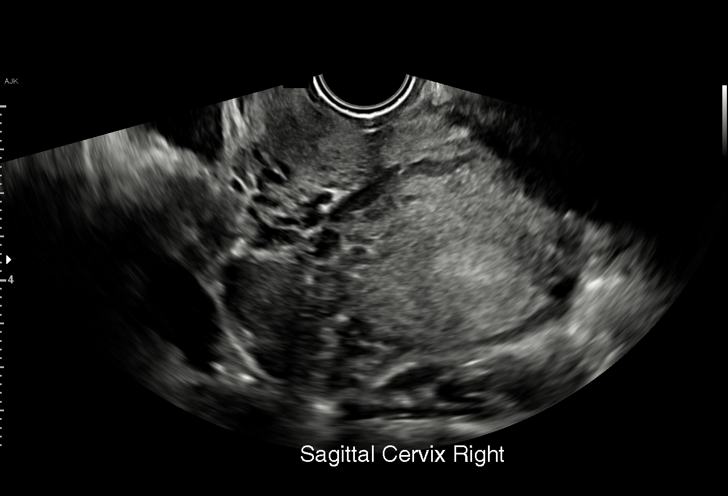
[im 38/74]
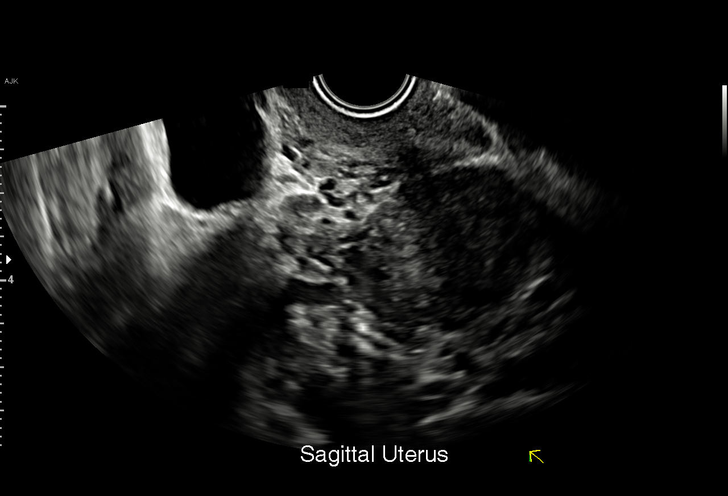
[im 41/74]
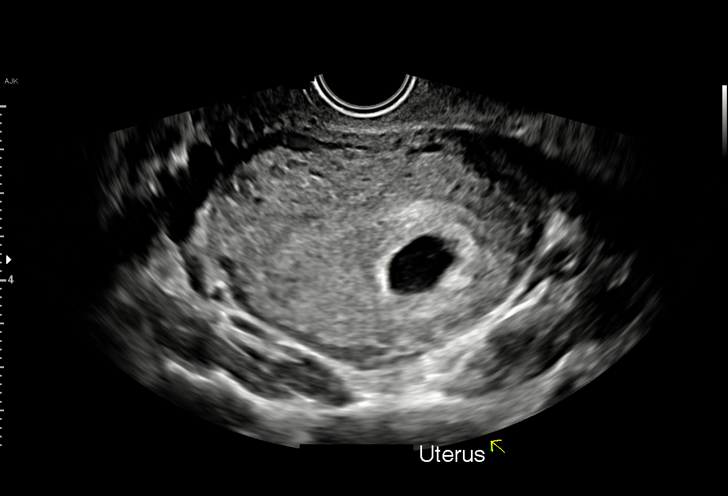
[im 46/74]
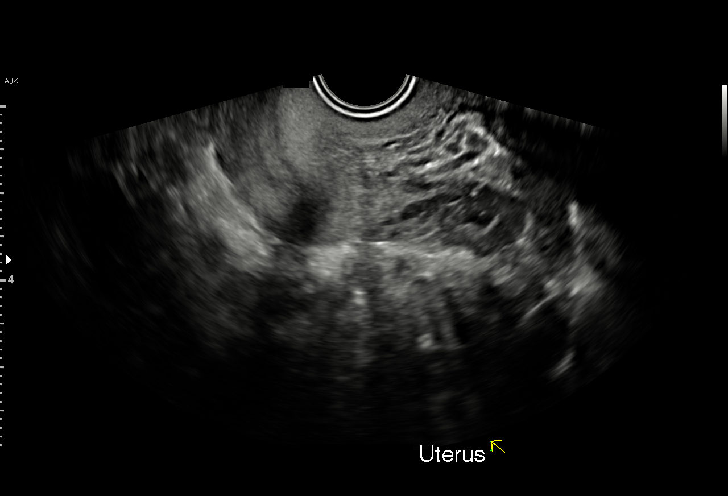
[im 52/74]
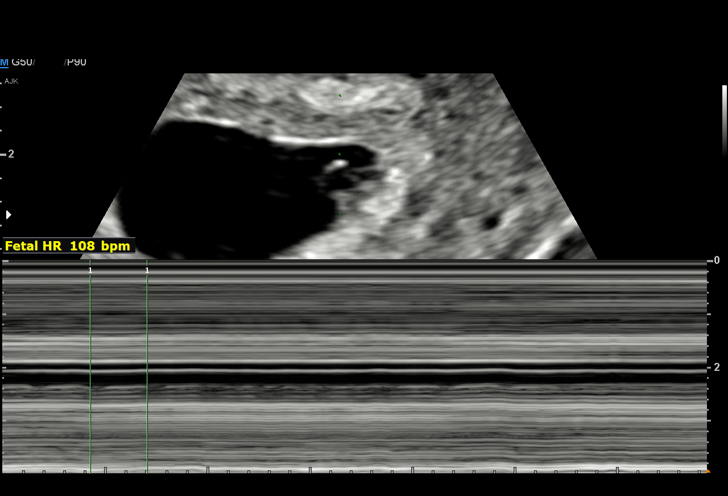
[im 57/74]
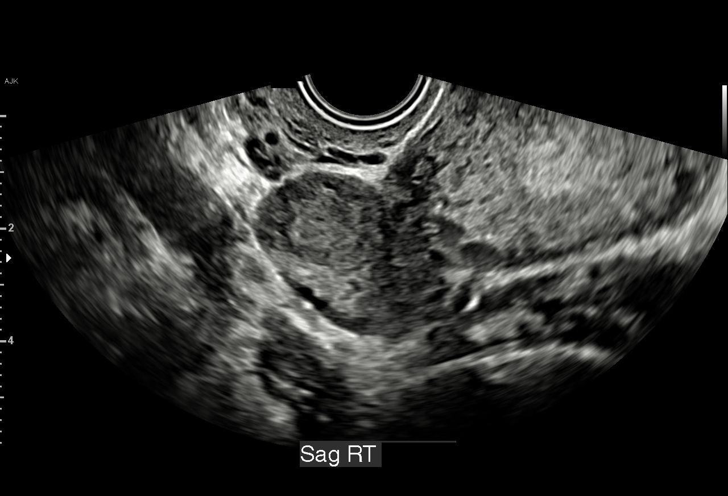
[im 63/74]
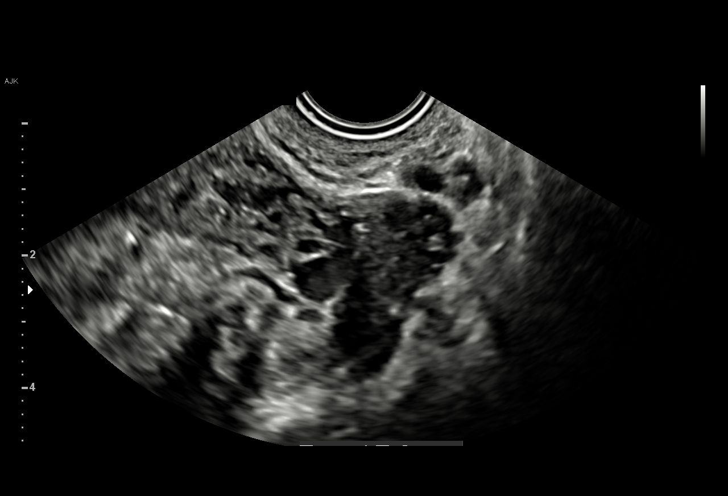
[im 68/74]
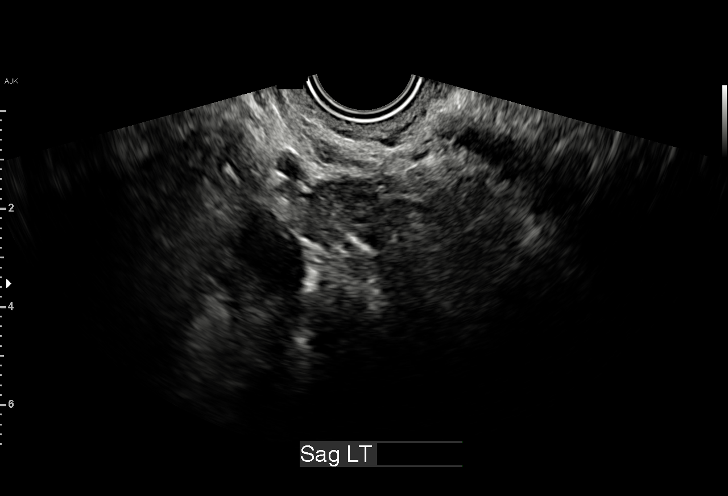
[im 74/74]
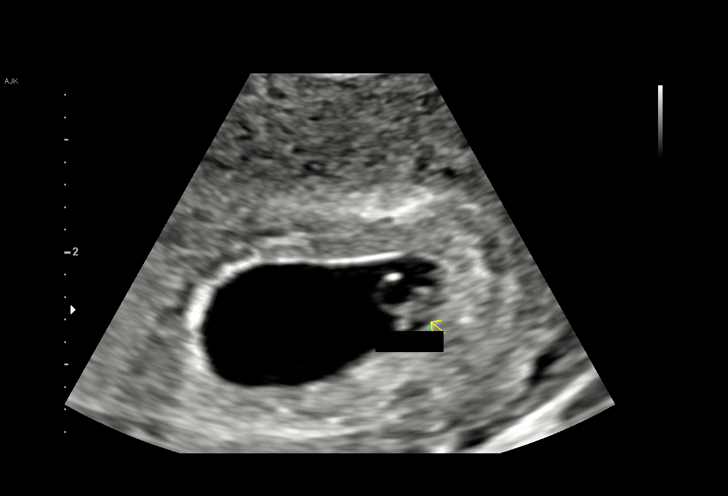

[15 of 28 positions shown; findings below may reference images not displayed]

FINDINGS: Intrauterine gestational sac: Single

Yolk sac:  Yes

Embryo:  Yes

Cardiac Activity: Yes

Heart Rate: 108 bpm

CRL:  5.0 mm   6 w   1 d                  US EDC: 09/21/2021

Subchorionic hemorrhage:  None visualized.

Maternal uterus/adnexae: Within normal limits. A possible corpus
luteal cyst is noted in the right ovary. A small amount of free
fluid is noted in the pelvis.
IMPRESSION: Single live intrauterine pregnancy with estimated gestational age of
6 weeks 1 day. EDC 09/21/2021. No acute abnormality is identified.

## 2023-05-13 ENCOUNTER — Other Ambulatory Visit: Payer: Self-pay

## 2023-05-13 ENCOUNTER — Emergency Department (HOSPITAL_BASED_OUTPATIENT_CLINIC_OR_DEPARTMENT_OTHER)
Admission: EM | Admit: 2023-05-13 | Discharge: 2023-05-13 | Disposition: A | Discharge status: Home / Self Care | Attending: Emergency Medicine | Admitting: Emergency Medicine

## 2023-05-13 ENCOUNTER — Encounter (HOSPITAL_BASED_OUTPATIENT_CLINIC_OR_DEPARTMENT_OTHER): Payer: Self-pay | Admitting: Emergency Medicine

## 2023-05-13 DIAGNOSIS — N939 Abnormal uterine and vaginal bleeding, unspecified: Secondary | ICD-10-CM | POA: Insufficient documentation

## 2023-05-13 DIAGNOSIS — Z3202 Encounter for pregnancy test, result negative: Secondary | ICD-10-CM | POA: Insufficient documentation

## 2023-05-13 LAB — CBC
HCT: 37.7 % (ref 36.0–46.0)
Hemoglobin: 12.9 g/dL (ref 12.0–15.0)
MCH: 29.7 pg (ref 26.0–34.0)
MCHC: 34.2 g/dL (ref 30.0–36.0)
MCV: 86.9 fL (ref 80.0–100.0)
Platelets: 279 10*3/uL (ref 150–400)
RBC: 4.34 MIL/uL (ref 3.87–5.11)
RDW: 12.7 % (ref 11.5–15.5)
WBC: 4.1 10*3/uL (ref 4.0–10.5)
nRBC: 0 % (ref 0.0–0.2)

## 2023-05-13 LAB — PREGNANCY, URINE: Preg Test, Ur: NEGATIVE

## 2023-05-13 NOTE — ED Provider Notes (Signed)
  EMERGENCY DEPARTMENT AT [facility] Provider Note   CSN: [id number] Arrival date & time: 05/13/23  1935     History Chief Complaint  Patient presents with   Vaginal Bleeding    HPI Emily Cantu is a 28 y.o. female presenting for heavier than normal vaginal bleeding. States that she is scheduled for cycle at this time but that she does not normally go through 3 pads in a day.   Patient's recorded medical, surgical, social, medication list and allergies were reviewed in the Snapshot window as part of the initial history.   Review of Systems   Review of Systems  Constitutional:  Negative for chills and fever.  HENT:  Negative for ear pain and sore throat.   Eyes:  Negative for pain and visual disturbance.  Respiratory:  Negative for cough and shortness of breath.   Cardiovascular:  Negative for chest pain and palpitations.  Gastrointestinal:  Negative for abdominal pain and vomiting.  Genitourinary:  Positive for vaginal bleeding. Negative for dysuria and hematuria.  Musculoskeletal:  Negative for arthralgias and back pain.  Skin:  Negative for color change and rash.  Neurological:  Negative for seizures and syncope.  All other systems reviewed and are negative.   Physical Exam Updated Vital Signs BP 122/71 (BP Location: Right Arm)   Pulse 64   Temp 98 F (36.7 C)   Resp 18   Ht 5\' 3"  (1.6 m)   Wt 65 kg   LMP 05/12/2023 (Exact Date)   SpO2 100%   BMI 25.38 kg/m  Physical Exam Vitals and nursing note reviewed.  Constitutional:      General: She is not in acute distress.    Appearance: She is well-developed.  HENT:     Head: Normocephalic and atraumatic.  Eyes:     Conjunctiva/sclera: Conjunctivae normal.  Cardiovascular:     Rate and Rhythm: Normal rate and regular rhythm.     Heart sounds: No murmur heard. Pulmonary:     Effort: Pulmonary effort is normal. No respiratory distress.     Breath sounds: Normal breath sounds.  Abdominal:      General: There is no distension.     Palpations: Abdomen is soft.     Tenderness: There is no abdominal tenderness. There is no right CVA tenderness or left CVA tenderness.  Musculoskeletal:        General: No swelling or tenderness. Normal range of motion.     Cervical back: Neck supple.  Skin:    General: Skin is warm and dry.  Neurological:     General: No focal deficit present.     Mental Status: She is alert and oriented to person, place, and time. Mental status is at baseline.     Cranial Nerves: No cranial nerve deficit.      ED Course/ Medical Decision Making/ A&P    Procedures Procedures   Medications Ordered in ED Medications - No data to display  Medical Decision Making:   28 year old female presenting with vaginal bleeding.  Denies fevers chills nausea vomiting shortness of breath.  Otherwise ambulatory tolerating p.o. intake. No other complaints on arrival. Endorses 3 pads needed today heavier than her normal cycles.  She is on her normal cycles on schedule.  Hemoglobin checked, within normal limits for patient.  Pregnancy test also negative.  Reassessment: Patient states bleeding is improving. Will refer to gynecology in the outpatient setting for further care and management.  Disposition:  I have considered need  for hospitalization, however, considering all of the above, I believe this patient is stable for discharge at this time.  Patient/family educated about specific return precautions for given chief complaint and symptoms.  Patient/family educated about follow-up with PCP/GYN.     Patient/family expressed understanding of return precautions and need for follow-up. Patient spoken to regarding all imaging and laboratory results and appropriate follow up for these results. All education provided in verbal form with additional information in written form. Time was allowed for answering of patient questions. Patient discharged.    Emergency Department  Medication Summary:   Medications - No data to display  Clinical Impression:  1. Vaginal bleeding      Discharge   Final Clinical Impression(s) / ED Diagnoses Final diagnoses:  Vaginal bleeding    Rx / DC Orders ED Discharge Orders     None         Glyn Ade, MD 05/13/23 2219

## 2023-05-13 NOTE — ED Triage Notes (Signed)
 Pt reports heavier than normal vaginal bleeding with normal period, states having to change pad q30 min and passed large blood clot today, A&O in triage, neuro WNL, skin color WNL

## 2023-09-28 ENCOUNTER — Emergency Department (HOSPITAL_BASED_OUTPATIENT_CLINIC_OR_DEPARTMENT_OTHER)
Admission: EM | Admit: 2023-09-28 | Discharge: 2023-09-28 | Disposition: A | Discharge status: Home / Self Care | Attending: Emergency Medicine | Admitting: Emergency Medicine

## 2023-09-28 ENCOUNTER — Emergency Department (HOSPITAL_BASED_OUTPATIENT_CLINIC_OR_DEPARTMENT_OTHER)

## 2023-09-28 ENCOUNTER — Other Ambulatory Visit: Payer: Self-pay

## 2023-09-28 DIAGNOSIS — O26891 Other specified pregnancy related conditions, first trimester: Secondary | ICD-10-CM | POA: Diagnosis present

## 2023-09-28 DIAGNOSIS — M7989 Other specified soft tissue disorders: Secondary | ICD-10-CM | POA: Diagnosis not present

## 2023-09-28 DIAGNOSIS — Z3A08 8 weeks gestation of pregnancy: Secondary | ICD-10-CM | POA: Insufficient documentation

## 2023-09-28 DIAGNOSIS — R55 Syncope and collapse: Secondary | ICD-10-CM | POA: Insufficient documentation

## 2023-09-28 LAB — PREGNANCY, URINE: Preg Test, Ur: POSITIVE — AB

## 2023-09-28 LAB — CBC
HCT: 38 % (ref 36.0–46.0)
Hemoglobin: 13.2 g/dL (ref 12.0–15.0)
MCH: 30.2 pg (ref 26.0–34.0)
MCHC: 34.7 g/dL (ref 30.0–36.0)
MCV: 87 fL (ref 80.0–100.0)
Platelets: 261 K/uL (ref 150–400)
RBC: 4.37 MIL/uL (ref 3.87–5.11)
RDW: 12.6 % (ref 11.5–15.5)
WBC: 6.4 K/uL (ref 4.0–10.5)
nRBC: 0 % (ref 0.0–0.2)

## 2023-09-28 LAB — URINALYSIS, ROUTINE W REFLEX MICROSCOPIC
Bilirubin Urine: NEGATIVE
Glucose, UA: NEGATIVE mg/dL
Hgb urine dipstick: NEGATIVE
Ketones, ur: NEGATIVE mg/dL
Leukocytes,Ua: NEGATIVE
Nitrite: NEGATIVE
Protein, ur: NEGATIVE mg/dL
Specific Gravity, Urine: 1.023 (ref 1.005–1.030)
pH: 6 (ref 5.0–8.0)

## 2023-09-28 LAB — COMPREHENSIVE METABOLIC PANEL WITH GFR
ALT: 9 U/L (ref 0–44)
AST: 17 U/L (ref 15–41)
Albumin: 4.3 g/dL (ref 3.5–5.0)
Alkaline Phosphatase: 55 U/L (ref 38–126)
Anion gap: 14 (ref 5–15)
BUN: 10 mg/dL (ref 6–20)
CO2: 20 mmol/L — ABNORMAL LOW (ref 22–32)
Calcium: 10 mg/dL (ref 8.9–10.3)
Chloride: 103 mmol/L (ref 98–111)
Creatinine, Ser: 0.63 mg/dL (ref 0.44–1.00)
GFR, Estimated: >60 mL/min (ref >60)
Glucose, Bld: 90 mg/dL (ref 70–99)
Potassium: 3.8 mmol/L (ref 3.5–5.1)
Sodium: 136 mmol/L (ref 135–145)
Total Bilirubin: 0.3 mg/dL (ref 0.0–1.2)
Total Protein: 7.2 g/dL (ref 6.5–8.1)

## 2023-09-28 LAB — HCG, QUANTITATIVE, PREGNANCY: hCG, Beta Chain, Quant, S: 84914 m[IU]/mL — ABNORMAL HIGH (ref <5)

## 2023-09-28 LAB — CBG MONITORING, ED: Glucose-Capillary: 82 mg/dL (ref 70–99)

## 2023-09-28 MED ORDER — LACTATED RINGERS IV BOLUS
1000.0000 mL | Freq: Once | INTRAVENOUS | Status: AC
  Administered 2023-09-28: 1000 mL via INTRAVENOUS

## 2023-09-28 NOTE — ED Provider Notes (Addendum)
 [place] EMERGENCY DEPARTMENT AT [facility] Provider Note  CSN: [id number] Arrival date & time: 09/28/23 1940  Chief Complaint(s) Near Syncope and Leg Swelling  HPI Emily Cantu is a 28 y.o. female who is here today with a near syncopal episode.  Patient reports that she woke up today, she did dizzy spell after getting out of bed earlier.  She is intermittently had some swelling in her legs.  She was told that she had fluid around her heart by her PCP.  Patient's last menstrual cycle was 2 months ago.   Past Medical History Past Medical History:  Diagnosis Date   Abortion    Back pain affecting pregnancy    Chlamydia infection affecting pregnancy in third trimester 06/18/2019   Depression    Scoliosis    Subchorionic hematoma    Subchorionic hematoma in first trimester 11/01/2018   Bleeding precations Yolk sac seen at 5 wks Recheck US  in 2-3 weeks   Vaginal delivery 12/24/2016   There are no active problems to display for this patient.  Home Medication(s) Prior to Admission medications   Medication Sig Start Date End Date Taking? Authorizing Provider  cyclobenzaprine  (FLEXERIL ) 5 MG tablet Take 1 tablet (5 mg total) by mouth 3 (three) times daily as needed for muscle spasms. 10/31/21   Cleatus Moccasin, MD  predniSONE  (STERAPRED UNI-PAK 21 TAB) 10 MG (21) TBPK tablet 10mg  Tabs, 6 day taper. Use as directed 10/31/21   Roselyn Carlin NOVAK, MD  ferrous sulfate  (FERROUSUL) 325 (65 FE) MG tablet Take 1 tablet (325 mg total) by mouth 2 (two) times daily. 07/28/18 07/13/19  Stinson, Jacob J, DO  misoprostol  (CYTOTEC ) 200 MCG tablet Take 1 tablet (200 mcg total) by mouth 3 (three) times daily. 07/17/18 07/13/19  Letha Renshaw, CNM                                                                                                                                    Past Surgical History Past Surgical History:  Procedure Laterality Date   NO PAST SURGERIES     THERAPEUTIC ABORTION  05/28/2018    Family History Family History  Problem Relation Age of Onset   Hypertension Mother    Cancer Father    Hypertension Maternal Grandmother     Social History Social History   Tobacco Use   Smoking status: Never   Smokeless tobacco: Never  Vaping Use   Vaping status: Never Used  Substance Use Topics   Alcohol use: No   Drug use: No   Allergies Macrobid  [nitrofurantoin ]  Review of Systems Review of Systems  Physical Exam Vital Signs  I have reviewed the triage vital signs BP 99/66   Pulse 60   Temp 98 F (36.7 C) (Oral)   Resp 16   Ht 5' 8 (1.727 m)   LMP 09/05/2023 (Exact Date)   SpO2 100%   BMI 21.79 kg/m  Physical Exam Vitals and nursing note reviewed.  HENT:     Mouth/Throat:     Mouth: Mucous membranes are moist.  Eyes:     Pupils: Pupils are equal, round, and reactive to light.  Cardiovascular:     Rate and Rhythm: Normal rate.     Pulses: Normal pulses.  Pulmonary:     Effort: Pulmonary effort is normal.  Abdominal:     General: Abdomen is flat.  Musculoskeletal:        General: No deformity. Normal range of motion.     Cervical back: Normal range of motion.     Comments: Mild left lower extremity edema  Neurological:     General: No focal deficit present.     Mental Status: She is alert.     ED Results and Treatments Labs (all labs ordered are listed, but only abnormal results are displayed) Labs Reviewed  COMPREHENSIVE METABOLIC PANEL WITH GFR - Abnormal; Notable for the following components:      Result Value   CO2 20 (*)    All other components within normal limits  PREGNANCY, URINE - Abnormal; Notable for the following components:   Preg Test, Ur POSITIVE (*)    All other components within normal limits  HCG, QUANTITATIVE, PREGNANCY - Abnormal; Notable for the following components:   hCG, Beta Chain, Quant, S 84,914 (*)    All other components within normal limits  CBC  URINALYSIS, ROUTINE W REFLEX MICROSCOPIC  CBG  MONITORING, ED                                                                                                                          Radiology US  Venous Img Lower  Left (DVT Study) Result Date: 09/28/2023 CLINICAL DATA:  Swelling. EXAM: LEFT LOWER EXTREMITY VENOUS DOPPLER ULTRASOUND TECHNIQUE: Gray-scale sonography with compression, as well as color and duplex ultrasound, were performed to evaluate the deep venous system(s) from the level of the common femoral vein through the popliteal and proximal calf veins. COMPARISON:  None Available. FINDINGS: VENOUS Normal compressibility of the common femoral, superficial femoral, and popliteal veins, as well as the visualized calf veins. Visualized portions of profunda femoral vein and great saphenous vein unremarkable. No filling defects to suggest DVT on grayscale or color Doppler imaging. Doppler waveforms show normal direction of venous flow, normal respiratory plasticity and response to augmentation. Limited views of the contralateral common femoral vein are unremarkable. OTHER None. Limitations: none IMPRESSION: No evidence of left lower extremity DVT. Electronically Signed   By: Andrea Gasman M.D.   On: 09/28/2023 22:10    Pertinent labs & imaging results that were available during my care of the patient were reviewed by me and considered in my medical decision making (see MDM for details).  Medications Ordered in ED Medications  lactated ringers  bolus 1,000 mL (0 mLs Intravenous Stopped 09/28/23 2309)  Procedures Ultrasound ED OB Pelvic  Date/Time: 09/28/2023 11:08 PM  Performed by: Mannie Fairy DASEN, DO Authorized by: Mannie Fairy DASEN, DO   Procedure details:    Indications: evaluate for IUP     Assess:  Intrauterine pregnancy   Technique:  Transabdominal obstetric (HCG+) exam   Images: not archived (Machine  malfunction)    Uterine findings:    Intrauterine pregnancy: identified     Single gestation: identified     Fetal pole: identified     Fetal heart rate: identified      Left ovary findings:    Left ovary:  Visualized    Right ovary findings:     Right ovary:  Visualized    Ultrasound ED Echo  Date/Time: 09/28/2023 11:09 PM  Performed by: Mannie Fairy DASEN, DO Authorized by: Mannie Fairy DASEN, DO   Procedure details:    Indications: syncope     Views: subxiphoid     Images: archived   Findings:    Pericardium: no pericardial effusion     LV Function: normal (>50% EF)     RV Diameter: normal   Impression:    Impression: normal     (including critical care time)  Medical Decision Making / ED Course   This patient presents to the ED for concern of near syncope, this involves an extensive number of treatment options, and is a complaint that carries with it a high risk of complications and morbidity.  The differential diagnosis includes near syncope, consider pulmonary embolism, pregnancy, arrhythmia.  MDM: Well-appearing 28 year old female.  Normal vital signs.  Her urinalysis was collected at triage did show a positive pregnancy test, obtained quant which was significant elevated.  Obtained transabdominal ultrasound which showed an IUP.  Fetal cardiac activity was observed.  Unfortunately, our ultrasound is not allowing us  to save an emergency mode, and there are no Loggins which allows to save images.    Her DVT ultrasound is negative.  Her CT imaging shows normal sinus rhythm, no ST segment depression or elevation, no acute ischemia.  Her cardiac ultrasound did not show any pericardial effusion.  Patient overall well-appearing.  Is appropriate for discharge with OB/GYN follow-up.  Additional history obtained: -Additional history obtained from family at bedside -External records from outside source obtained and reviewed including: Chart review including previous notes,  labs, imaging, consultation notes   Lab Tests: -I ordered, reviewed, and interpreted labs.   The pertinent results include:   Labs Reviewed  COMPREHENSIVE METABOLIC PANEL WITH GFR - Abnormal; Notable for the following components:      Result Value   CO2 20 (*)    All other components within normal limits  PREGNANCY, URINE - Abnormal; Notable for the following components:   Preg Test, Ur POSITIVE (*)    All other components within normal limits  HCG, QUANTITATIVE, PREGNANCY - Abnormal; Notable for the following components:   hCG, Beta Chain, Quant, S [id number] (*)    All other components within normal limits  CBC  URINALYSIS, ROUTINE W REFLEX MICROSCOPIC  CBG MONITORING, ED      EKG my independent review of the patient's EKG shows no ST segment depressions or elevations, no T wave inversions, no evidence of acute ischemia.  EKG Interpretation Date/Time:  Tuesday September 28 2023 19:50:05 EDT Ventricular Rate:  71 PR Interval:  176 QRS Duration:  96 QT Interval:  398 QTC Calculation: 432 R Axis:   64  Text Interpretation: Normal sinus rhythm with sinus arrhythmia Incomplete right  bundle branch block Borderline ECG When compared with ECG of 03-May-2022 23:08, No significant change was found Confirmed by Mannie Pac [phone]) on 09/28/2023 11:06:47 PM         Imaging Studies ordered: I ordered imaging studies including DVT ultrasound I independently visualized and interpreted imaging. I agree with the radiologist interpretation   Medicines ordered and prescription drug management: Meds ordered this encounter  Medications   lactated ringers  bolus 1,000 mL    -I have reviewed the patients home medicines and have made adjustments as needed   Cardiac Monitoring: The patient was maintained on a cardiac monitor.  I personally viewed and interpreted the cardiac monitored which showed an underlying rhythm of: Normal sinus rhythm  Social Determinants of Health:  Factors  impacting patients care include: Lack of access to primary care   Reevaluation: After the interventions noted above, I reevaluated the patient and found that they have :improved  Co morbidities that complicate the patient evaluation  Past Medical History:  Diagnosis Date   Abortion    Back pain affecting pregnancy    Chlamydia infection affecting pregnancy in third trimester 06/18/2019   Depression    Scoliosis    Subchorionic hematoma    Subchorionic hematoma in first trimester 11/01/2018   Bleeding precations Yolk sac seen at 5 wks Recheck US  in 2-3 weeks   Vaginal delivery 12/24/2016      Dispostion: I considered admission for this patient, however she is appropriate for outpatient follow-up     Final Clinical Impression(s) / ED Diagnoses Final diagnoses:  [redacted] weeks gestation of pregnancy  Near syncope     @PCDICTATION @    Mannie Pac T, DO 09/28/23 2309    Mannie Pac T, DO 09/28/23 2309

## 2023-09-28 NOTE — ED Triage Notes (Signed)
 Pt POV reporting dizzy spell after getting out of bed earlier today, immediately sat down, sx resolved. Also reporting bilateral leg swelling, seen by cardiologist in June, told she has fluid around heart, CT angio not yet scheduled. Denies Cp/SOB, NAD noted at this time.

## 2023-09-28 NOTE — Discharge Instructions (Signed)
 While you were in the emergency room, you your pregnancy test was positive.  You had an intrauterine pregnancy, which means the pregnancy is where it supposed to be.  He had an ultrasound done of your legs to look for blood clots and it was negative.  I have included a telephone number for a obstetrician.  You may call them tomorrow to set up a follow-up appointment.  Follow-up with your primary care doctor.  Drink plenty of water over the next few days.  Return to the emergency room if you have repeat episodes of feeling like you are about to faint.

## 2024-03-04 ENCOUNTER — Emergency Department

## 2024-03-04 ENCOUNTER — Other Ambulatory Visit: Payer: Self-pay

## 2024-03-04 DIAGNOSIS — R079 Chest pain, unspecified: Secondary | ICD-10-CM | POA: Insufficient documentation

## 2024-03-04 LAB — BASIC METABOLIC PANEL WITH GFR
Anion gap: 9 (ref 5–15)
BUN: 10 mg/dL (ref 6–20)
CO2: 23 mmol/L (ref 22–32)
Calcium: 9.1 mg/dL (ref 8.9–10.3)
Chloride: 107 mmol/L (ref 98–111)
Creatinine, Ser: 0.65 mg/dL (ref 0.44–1.00)
GFR, Estimated: >60 mL/min
Glucose, Bld: 89 mg/dL (ref 70–99)
Potassium: 4 mmol/L (ref 3.5–5.1)
Sodium: 139 mmol/L (ref 135–145)

## 2024-03-04 LAB — CBC
HCT: 38.6 % (ref 36.0–46.0)
Hemoglobin: 13.1 g/dL (ref 12.0–15.0)
MCH: 29.4 pg (ref 26.0–34.0)
MCHC: 33.9 g/dL (ref 30.0–36.0)
MCV: 86.7 fL (ref 80.0–100.0)
Platelets: 240 K/uL (ref 150–400)
RBC: 4.45 MIL/uL (ref 3.87–5.11)
RDW: 12.3 % (ref 11.5–15.5)
WBC: 3.9 K/uL — ABNORMAL LOW (ref 4.0–10.5)
nRBC: 0 % (ref 0.0–0.2)

## 2024-03-04 LAB — TROPONIN T, HIGH SENSITIVITY: Troponin T High Sensitivity: <15 ng/L (ref 0–19)

## 2024-03-04 NOTE — ED Triage Notes (Signed)
 Pt c/o intermittent central CP and SHOB since yesterday. Pt states she has cardiac history and has been drinking a lot of caffeine  to stay up. CP worse w/ inspiration. Pt is AOX4, Nad noted. Pt denies N/V/D. Pt denies S/D/D.

## 2024-03-05 ENCOUNTER — Emergency Department
Admission: EM | Admit: 2024-03-05 | Discharge: 2024-03-05 | Disposition: A | Attending: Emergency Medicine | Admitting: Emergency Medicine

## 2024-03-05 DIAGNOSIS — R079 Chest pain, unspecified: Secondary | ICD-10-CM

## 2024-03-05 HISTORY — DX: Bradycardia, unspecified: R00.1

## 2024-03-05 LAB — LIPASE, BLOOD: Lipase: 36 U/L (ref 11–51)

## 2024-03-05 LAB — HEPATIC FUNCTION PANEL
ALT: 9 U/L (ref 0–44)
AST: 16 U/L (ref 15–41)
Albumin: 4.1 g/dL (ref 3.5–5.0)
Alkaline Phosphatase: 50 U/L (ref 38–126)
Bilirubin, Direct: 0.1 mg/dL (ref 0.0–0.2)
Indirect Bilirubin: 0.2 mg/dL — ABNORMAL LOW (ref 0.3–0.9)
Total Bilirubin: 0.3 mg/dL (ref 0.0–1.2)
Total Protein: 6.7 g/dL (ref 6.5–8.1)

## 2024-03-05 LAB — TROPONIN T, HIGH SENSITIVITY: Troponin T High Sensitivity: <15 ng/L (ref 0–19)

## 2024-03-05 NOTE — ED Provider Notes (Signed)
 "  [facility] Provider Note    Event Date/Time   First MD Initiated Contact with Patient 03/05/24 0032     (approximate)   History   Chest Pain   HPI  Emily Cantu is a 29 y.o. female   Past medical history of no significant past medical history here with chest pain across the chest for the last 2 days.  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  She works at [employer] and does perform some manual labor tasks but has not had no direct trauma or injuries.  It is nonexertional, no respiratory symptoms related, no other acute medical complaints.      External Medical Documents Reviewed: Previous hospital notes      Physical Exam   Triage Vital Signs: ED Triage Vitals  Encounter Vitals Group     BP 03/04/24 2213 123/76     Girls Systolic BP Percentile --      Girls Diastolic BP Percentile --      Boys Systolic BP Percentile --      Boys Diastolic BP Percentile --      Pulse Rate 03/04/24 2213 62     Resp 03/04/24 2213 18     Temp 03/04/24 2213 98.6 F (37 C)     Temp Source 03/04/24 2213 Oral     SpO2 03/04/24 2213 100 %     Weight --      Height 03/04/24 2214 5' 5 (1.651 m)     Head Circumference --      Peak Flow --      Pain Score 03/04/24 2213 6     Pain Loc --      Pain Education --      Exclude from Growth Chart --     Most recent vital signs: Vitals:   03/05/24 0023 03/05/24 0200  BP: 105/63 102/67  Pulse: (!) 53 (!) 53  Resp: 16 12  Temp: 98 F (36.7 C) 98 F (36.7 C)  SpO2: 100% 100%    General: Awake, no distress.  CV:  Good peripheral perfusion.  Resp:  Normal effort.  Abd:  No distention.  Other:  Well-appearing comfortable young woman in no acute distress slightly bradycardic, but normal rhythm on auscultation of the heart and clear lungs soft benign abdominal exam and skin appears warm well-perfused.   ED Results / Procedures /  Treatments   Labs (all labs ordered are listed, but only abnormal results are displayed) Labs Reviewed  CBC - Abnormal; Notable for the following components:      Result Value   WBC 3.9 (*)    All other components within normal limits  HEPATIC FUNCTION PANEL - Abnormal; Notable for the following components:   Indirect Bilirubin 0.2 (*)    All other components within normal limits  BASIC METABOLIC PANEL WITH GFR  LIPASE, BLOOD  TROPONIN T, HIGH SENSITIVITY  TROPONIN T, HIGH SENSITIVITY     I ordered and reviewed the above labs they are notable for cell counts electrolytes and serial troponins unremarkable  EKG  ED ECG REPORT I, Ginnie Shams, the attending physician, personally viewed and interpreted this ECG.   Date: 03/05/2024  EKG Time: 2213  Rate: 57  Rhythm: sinus bradycardia  Axis: nl  Intervals: rbbb  ST&T Change: no stemi    RADIOLOGY I independently reviewed and interpreted chest x-ray to see  no obvious pneumothorax or focality I also reviewed radiologist's formal read.   PROCEDURES:  Critical Care performed: No  Procedures   MEDICATIONS ORDERED IN ED: Medications - No data to display   IMPRESSION / MDM / ASSESSMENT AND PLAN / ED COURSE  I reviewed the triage vital signs and the nursing notes.                                Patient's presentation is most consistent with acute presentation with potential threat to life or bodily function.  Differential diagnosis includes, but is not limited to, ACS, PE, dissection, respiratory infection, pneumothorax, upper abdominal pathologies like pancreatitis gastritis biliary problems    MDM:    Well-appearing young woman with nonspecific chest pain in the setting of working overnights and drinking a lot of caffeine  beverages.  Considered ACS PE dissection but symptoms are not very consistent with these, and EKG is unremarkable and serial troponins have been unremarkable as well.  Doubt cardiopulmonary  emergency.  Considered upper abdominal pathologies as well but with a very benign abdominal exam I think unlikely.  Workup including labs troponins EKG LFTs lipase unremarkable.  I considered hospitalization for admission or observation however given unremarkable workup in this young healthy patient, I think outpatient follow-up with PMD is appropriate discharge at this time given very low clinical's concern for cardiopulmonary or life-threatening emergencies.        FINAL CLINICAL IMPRESSION(S) / ED DIAGNOSES   Final diagnoses:  Nonspecific chest pain     Rx / DC Orders   ED Discharge Orders     None        Note:  This document was prepared using Dragon voice recognition software and may include unintentional dictation errors.    Cyrena Mylar, MD 03/05/24 [phone]  "

## 2024-03-05 NOTE — Discharge Instructions (Addendum)
 Fortunately your evaluation in the emergency department did not show any emergency conditions like heart attack or any other life-threatening conditions that explain your pain.  Thank you for choosing us  for your health care today!  Please see your primary doctor this week for a follow up appointment.   If you have any new, worsening, or unexpected symptoms call your doctor right away or come back to the emergency department for reevaluation.  It was my pleasure to care for you today.   Ginnie EDISON Cyrena, MD
